# Patient Record
Sex: Male | Born: 1955
Health system: Southern US, Community
[De-identification: ages and names within clinical notes are randomized; demographics above are authoritative.]

## PROBLEM LIST (undated history)

## (undated) DIAGNOSIS — E119 Type 2 diabetes mellitus without complications: Secondary | ICD-10-CM

## (undated) DIAGNOSIS — I2510 Atherosclerotic heart disease of native coronary artery without angina pectoris: Secondary | ICD-10-CM

## (undated) DIAGNOSIS — J9601 Acute respiratory failure with hypoxia: Secondary | ICD-10-CM

## (undated) DIAGNOSIS — K5790 Diverticulosis of intestine, part unspecified, without perforation or abscess without bleeding: Secondary | ICD-10-CM

## (undated) DIAGNOSIS — I5021 Acute systolic (congestive) heart failure: Secondary | ICD-10-CM

## (undated) DIAGNOSIS — R918 Other nonspecific abnormal finding of lung field: Secondary | ICD-10-CM

## (undated) DIAGNOSIS — I82409 Acute embolism and thrombosis of unspecified deep veins of unspecified lower extremity: Secondary | ICD-10-CM

## (undated) HISTORY — PX: KNEE SURGERY: SHX244

## (undated) HISTORY — DX: Diverticulosis of intestine, part unspecified, without perforation or abscess without bleeding: K57.90

---

## 2013-11-18 ENCOUNTER — Emergency Department (HOSPITAL_COMMUNITY)
Admission: EM | Admit: 2013-11-18 | Discharge: 2013-11-18 | Disposition: A | Discharge status: Home / Self Care | Payer: BC Managed Care – PPO | Attending: Emergency Medicine | Admitting: Emergency Medicine

## 2013-11-18 ENCOUNTER — Emergency Department (HOSPITAL_COMMUNITY): Payer: BC Managed Care – PPO

## 2013-11-18 ENCOUNTER — Encounter (HOSPITAL_COMMUNITY): Payer: Self-pay | Admitting: Emergency Medicine

## 2013-11-18 DIAGNOSIS — R630 Anorexia: Secondary | ICD-10-CM | POA: Diagnosis not present

## 2013-11-18 DIAGNOSIS — E119 Type 2 diabetes mellitus without complications: Secondary | ICD-10-CM

## 2013-11-18 DIAGNOSIS — F17200 Nicotine dependence, unspecified, uncomplicated: Secondary | ICD-10-CM | POA: Diagnosis not present

## 2013-11-18 DIAGNOSIS — R1011 Right upper quadrant pain: Secondary | ICD-10-CM | POA: Diagnosis present

## 2013-11-18 DIAGNOSIS — K2980 Duodenitis without bleeding: Secondary | ICD-10-CM

## 2013-11-18 DIAGNOSIS — I10 Essential (primary) hypertension: Secondary | ICD-10-CM | POA: Diagnosis not present

## 2013-11-18 LAB — URINALYSIS, ROUTINE W REFLEX MICROSCOPIC
Bilirubin Urine: NEGATIVE
Glucose, UA: >1000 mg/dL — AB
Hgb urine dipstick: NEGATIVE
Ketones, ur: NEGATIVE mg/dL
LEUKOCYTES UA: NEGATIVE
NITRITE: NEGATIVE
PH: 5 (ref 5.0–8.0)
Protein, ur: 30 mg/dL — AB
Specific Gravity, Urine: 1.029 (ref 1.005–1.030)
Urobilinogen, UA: 0.2 mg/dL (ref 0.0–1.0)

## 2013-11-18 LAB — CBC WITH DIFFERENTIAL/PLATELET
BASOS PCT: 0 % (ref 0–1)
Basophils Absolute: 0.1 10*3/uL (ref 0.0–0.1)
EOS ABS: 0.2 10*3/uL (ref 0.0–0.7)
Eosinophils Relative: 1 % (ref 0–5)
HEMATOCRIT: 51.3 % (ref 39.0–52.0)
Hemoglobin: 18.5 g/dL — ABNORMAL HIGH (ref 13.0–17.0)
Lymphocytes Relative: 16 % (ref 12–46)
Lymphs Abs: 3 10*3/uL (ref 0.7–4.0)
MCH: 32.2 pg (ref 26.0–34.0)
MCHC: 36.1 g/dL — AB (ref 30.0–36.0)
MCV: 89.2 fL (ref 78.0–100.0)
MONO ABS: 1.6 10*3/uL — AB (ref 0.1–1.0)
Monocytes Relative: 9 % (ref 3–12)
Neutro Abs: 14.1 10*3/uL — ABNORMAL HIGH (ref 1.7–7.7)
Neutrophils Relative %: 74 % (ref 43–77)
PLATELETS: 285 10*3/uL (ref 150–400)
RBC: 5.75 MIL/uL (ref 4.22–5.81)
RDW: 13.4 % (ref 11.5–15.5)
WBC: 19 10*3/uL — ABNORMAL HIGH (ref 4.0–10.5)

## 2013-11-18 LAB — COMPREHENSIVE METABOLIC PANEL
ALT: 12 U/L (ref 0–53)
AST: 10 U/L (ref 0–37)
Albumin: 3.6 g/dL (ref 3.5–5.2)
Alkaline Phosphatase: 95 U/L (ref 39–117)
Anion gap: 13 (ref 5–15)
BUN: 13 mg/dL (ref 6–23)
CALCIUM: 9.9 mg/dL (ref 8.4–10.5)
CO2: 25 mEq/L (ref 19–32)
CREATININE: 0.88 mg/dL (ref 0.50–1.35)
Chloride: 92 mEq/L — ABNORMAL LOW (ref 96–112)
GFR calc Af Amer: >90 mL/min (ref >90)
Glucose, Bld: 284 mg/dL — ABNORMAL HIGH (ref 70–99)
Potassium: 4.2 mEq/L (ref 3.7–5.3)
Sodium: 130 mEq/L — ABNORMAL LOW (ref 137–147)
TOTAL PROTEIN: 7.8 g/dL (ref 6.0–8.3)
Total Bilirubin: 0.6 mg/dL (ref 0.3–1.2)

## 2013-11-18 LAB — CBG MONITORING, ED: Glucose-Capillary: 290 mg/dL — ABNORMAL HIGH (ref 70–99)

## 2013-11-18 LAB — LIPASE, BLOOD: Lipase: 46 U/L (ref 11–59)

## 2013-11-18 LAB — I-STAT TROPONIN, ED: Troponin i, poc: 0 ng/mL (ref 0.00–0.08)

## 2013-11-18 LAB — URINE MICROSCOPIC-ADD ON

## 2013-11-18 MED ORDER — IOHEXOL 300 MG/ML  SOLN
50.0000 mL | Freq: Once | INTRAMUSCULAR | Status: AC | PRN
Start: 2013-11-18 — End: 2013-11-18
  Administered 2013-11-18: 50 mL via ORAL

## 2013-11-18 MED ORDER — SODIUM CHLORIDE 0.9 % IV SOLN
1000.0000 mL | INTRAVENOUS | Status: DC
  Administered 2013-11-18: 1000 mL via INTRAVENOUS

## 2013-11-18 MED ORDER — HYDROCODONE-ACETAMINOPHEN 5-325 MG PO TABS
1.0000 | ORAL_TABLET | Freq: Once | ORAL | Status: AC
  Administered 2013-11-18: 1 via ORAL
  Filled 2013-11-18: qty 1

## 2013-11-18 MED ORDER — LANSOPRAZOLE 30 MG PO TBDP: 30.0000 mg | ORAL_TABLET | Freq: Every day | ORAL | Status: DC

## 2013-11-18 MED ORDER — PANTOPRAZOLE SODIUM 40 MG IV SOLR
40.0000 mg | Freq: Once | INTRAVENOUS | Status: AC
  Administered 2013-11-18: 40 mg via INTRAVENOUS
  Filled 2013-11-18: qty 40

## 2013-11-18 MED ORDER — SODIUM CHLORIDE 0.9 % IV SOLN
1000.0000 mL | Freq: Once | INTRAVENOUS | Status: AC
  Administered 2013-11-18: 1000 mL via INTRAVENOUS

## 2013-11-18 MED ORDER — HYDROCHLOROTHIAZIDE 25 MG PO TABS: 25.0000 mg | ORAL_TABLET | Freq: Every day | ORAL | Status: DC

## 2013-11-18 MED ORDER — IOHEXOL 300 MG/ML  SOLN
100.0000 mL | Freq: Once | INTRAMUSCULAR | Status: AC | PRN
  Administered 2013-11-18: 100 mL via INTRAVENOUS

## 2013-11-18 NOTE — ED Provider Notes (Signed)
CSN: [id number]     Arrival date & time 11/18/13  1858 History   First MD Initiated Contact with Patient 11/18/13 1937     Chief Complaint  Patient presents with  . Abdominal Pain    HPI Comments: Started Monday afternoon.  He thought he may have eaten something.  Patient is a 58 y.o. male presenting with abdominal pain. The history is provided by the patient.  Abdominal Pain Pain location:  Epigastric Pain quality: aching   Pain radiates to:  Does not radiate Pain severity:  Moderate Onset quality:  Gradual Timing:  Constant Progression:  Unchanged Chronicity:  New Relieved by:  Eating and belching (would make it better very briefly) Associated symptoms: anorexia   Associated symptoms: no chest pain, no diarrhea, no dysuria, no fever and no vomiting     History reviewed. No pertinent past medical history. Past Surgical History  Procedure Laterality Date  . Knee surgery     No family history on file. History  Substance Use Topics  . Smoking status: Current Every Day Smoker  . Smokeless tobacco: Not on file  . Alcohol Use: Yes     Comment: daily     Review of Systems  Constitutional: Negative for fever.  Cardiovascular: Negative for chest pain.  Gastrointestinal: Positive for abdominal pain and anorexia. Negative for vomiting and diarrhea.  Genitourinary: Negative for dysuria.  All other systems reviewed and are negative.     Allergies  Bee venom  Home Medications   Prior to Admission medications   Medication Sig Start Date End Date Taking? Authorizing Provider  ibuprofen (ADVIL,MOTRIN) 200 MG tablet Take 600 mg by mouth every 6 (six) hours as needed for moderate pain.   Yes Historical Provider, MD  hydrochlorothiazide (HYDRODIURIL) 25 MG tablet Take 1 tablet (25 mg total) by mouth daily. 11/18/13   Linwood Dibbles, MD  lansoprazole (PREVACID SOLUTAB) 30 MG disintegrating tablet Take 1 tablet (30 mg total) by mouth daily at 12 noon. 11/18/13   Linwood Dibbles, MD   BP  171/106  Pulse 91  Temp(Src) 98.4 F (36.9 C) (Oral)  Resp 20  SpO2 97% Physical Exam  Nursing note and vitals reviewed. Constitutional: He appears well-developed and well-nourished. No distress.  HENT:  Head: Normocephalic and atraumatic.  Right Ear: External ear normal.  Left Ear: External ear normal.  Eyes: Conjunctivae are normal. Right eye exhibits no discharge. Left eye exhibits no discharge. No scleral icterus.  Neck: Neck supple. No tracheal deviation present.  Cardiovascular: Normal rate, regular rhythm and intact distal pulses.   Pulmonary/Chest: Effort normal and breath sounds normal. No stridor. No respiratory distress. He has no wheezes. He has no rales.  Abdominal: Soft. Bowel sounds are normal. He exhibits no distension, no ascites and no mass. There is tenderness in the right upper quadrant. There is no rigidity, no rebound, no guarding and no CVA tenderness.  Musculoskeletal: He exhibits no edema and no tenderness.  Neurological: He is alert. He has normal strength. No cranial nerve deficit (no facial droop, extraocular movements intact, no slurred speech) or sensory deficit. He exhibits normal muscle tone. He displays no seizure activity. Coordination normal.  Skin: Skin is warm and dry. No rash noted.  Psychiatric: He has a normal mood and affect.    ED Course  Procedures (including critical care time) Labs Review Labs Reviewed  CBC WITH DIFFERENTIAL - Abnormal; Notable for the following:    WBC 19.0 (*)    Hemoglobin 18.5 (*)  MCHC 36.1 (*)    Neutro Abs 14.1 (*)    Monocytes Absolute 1.6 (*)    All other components within normal limits  COMPREHENSIVE METABOLIC PANEL - Abnormal; Notable for the following:    Sodium 130 (*)    Chloride 92 (*)    Glucose, Bld 284 (*)    All other components within normal limits  URINALYSIS, ROUTINE W REFLEX MICROSCOPIC - Abnormal; Notable for the following:    Color, Urine AMBER (*)    Glucose, UA >1000 (*)    Protein,  ur 30 (*)    All other components within normal limits  URINE MICROSCOPIC-ADD ON - Abnormal; Notable for the following:    Casts HYALINE CASTS (*)    All other components within normal limits  CBG MONITORING, ED - Abnormal; Notable for the following:    Glucose-Capillary 290 (*)    All other components within normal limits  LIPASE, BLOOD  I-STAT TROPOININ, ED    Imaging Review US Abdomen Complete  11/18/2013   CLINICAL DATA:  Pain.  EXAM: ULTRASOUND ABDOMEN COMPLETE  COMPARISON:  None.  FINDINGS: Gallbladder:  No gallstones or wall thickening visualized. No sonographic Murphy sign noted.  Common bile duct:  Diameter: 2.5 mm  Liver:  No focal lesion identified. Within normal limits in parenchymal echogenicity.  IVC:  No abnormality visualized.  Pancreas:  Visualized portion unremarkable.  Spleen:  Size and appearance within normal limits.  Right Kidney:  Length: 13.2 cm. Echogenicity within normal limits. 2.1 x 2.1 x 2.2 cm cyst containing an echogenic peripheral focus. Further evaluation of this finding with triphasic CT suggested.  Left Kidney:  Length: 12.5 cm. Echogenicity within normal limits. No significant mass or hydronephrosis visualized. 2.3 x 3.4 x 2.4 lower pole simple cyst. 2.0 x 1.7 x 1.7 cm upper pole simple cyst.  Abdominal aorta:  No aneurysm visualized.  Other findings:  None.  IMPRESSION: 1. No acute abnormality. 2. Right renal 2.1 x 2.1 x 2.1 cm cyst containing an echogenic possibly calcified peripheral focus. Further evaluation with nonemergent triphasic CT suggested . 3. Simple left renal cyst.   Electronically Signed   By: Maisie Fus  Register   On: 11/18/2013 20:51   Ct Abdomen Pelvis W Contrast  11/18/2013   CLINICAL DATA:  Right lower quadrant abdominal pain  EXAM: CT ABDOMEN AND PELVIS WITH CONTRAST  TECHNIQUE: Multidetector CT imaging of the abdomen and pelvis was performed using the standard protocol following bolus administration of intravenous contrast.  CONTRAST:  50mL  OMNIPAQUE IOHEXOL 300 MG/ML SOLN, OMNIPAQUE IOHEXOL 300 MG/ML SOLN  COMPARISON:  None.  FINDINGS: BODY WALL: Unremarkable.  LOWER CHEST: Unremarkable.  ABDOMEN/PELVIS:  Liver: No suspicious focal findings.  Biliary: No evidence of biliary obstruction or stone.  Pancreas: Subtle haziness of fat around the uncinate process. No evidence of mass. No ductal enlargement.  Spleen: Unremarkable.  Adrenals: Unremarkable.  Kidneys and ureters: No hydronephrosis or stone (hilar calcification is arterial). Bilateral renal cysts without concerning complexity.  Bladder: Unremarkable.  Reproductive: Unremarkable.  Bowel: No obstruction. Normal appendix. Distal colonic diverticulosis.  Retroperitoneum: No mass or adenopathy.  Peritoneum: No ascites or pneumoperitoneum.  Vascular: No acute abnormality. Extensive aortic and branch vessel atherosclerosis for age.  OSSEOUS: No acute abnormalities.  IMPRESSION: 1. Negative appendix. 2. Subtle fat haziness around the pancreatic uncinate process and mid duodenum. Given negative lipase, this could reflect mild duodenitis or peptic ulcer disease. 3. Colonic diverticulosis.   Electronically Signed   By: Christiane Ha  Watts M.D.   On: 11/18/2013 22:34     EKG Interpretation   Date/Time:  Thursday November 18 2013 19:39:40 EDT Ventricular Rate:  91 PR Interval:  139 QRS Duration: 85 QT Interval:  348 QTC Calculation: 428 R Axis:   14 Text Interpretation:  Sinus rhythm Consider right atrial enlargement  Borderline repolarization abnormality No old tracing to compare Confirmed  by Keynan Heffern  MD-J, Angelisa Winthrop [phone]([id number]) on 11/18/2013 7:44:41 PM     Medications  0.9 %  sodium chloride infusion (0 mLs Intravenous Stopped 11/18/13 2103)    Followed by  0.9 %  sodium chloride infusion (0 mLs Intravenous Stopped 11/18/13 2311)  pantoprazole (PROTONIX) injection 40 mg (40 mg Intravenous Given 11/18/13 2100)  iohexol (OMNIPAQUE) 300 MG/ML solution 50 mL (50 mLs Oral Contrast Given 11/18/13 2202)   iohexol (OMNIPAQUE) 300 MG/ML solution 100 mL (100 mLs Intravenous Contrast Given 11/18/13 2202)  HYDROcodone-acetaminophen (NORCO/VICODIN) 5-325 MG per tablet 1 tablet (1 tablet Oral Given 11/18/13 2315)    MDM   Final diagnoses:  Duodenitis  Essential hypertension  Type 2 diabetes mellitus without complication   The patient's CT scan shows inflammation around the duodenum and pancreas. His lipase is normal. The patient's symptoms may be related to duodenitis. He has noticed some improvement after each something which would be consistent with duodenitis. We'll discharge her home on antacids. And followup with primary care Dr. Bonita QuinYou may consider seeing a GI doctor as well as his symptoms did not improve.   Follow up with PCP regarding dm and htn.  Rx for HCTZ.  Discussed diet modification and importance of PCP follow up     Linwood DibblesJon Beatryce [place]ombo, MD 11/18/13 2325

## 2013-11-18 NOTE — ED Notes (Signed)
Pt presents with c/o abdominal pain. Pt was seen at a doctor today for the abdominal pain and was referred here because his sugar was >1000 in his urine. Pt also reports that the doctor said his EKG was abnormal so he wanted him to come to the ER. Pt denies any diarrhea, admits to nausea, no vomiting.

## 2013-11-18 NOTE — Discharge Instructions (Signed)
Duodenitis Duodenitis is inflammation of the lining of the first part of your small intestine (duodenum). There are two types of duodenitis:  Acute duodenitis (develops suddenly and is short lived).   Chronic duodenitis (develops over an extended period and lasts months to years). CAUSES  Duodenitis is most often caused by infection with the bacterium Helicobacter pylori (H. pylori). H. pylori increases the production of stomach acid and causes changes in the environment of the duodenum. This irritates and damages the cells of the duodenum causing inflammation. Other causes of duodenitis include:   Long-term use of nonsteroidal anti-inflammatory drugs (NSAIDs). NSAIDs change the lining of the duodenum and make it more prone to injury from stomach acid.  Excessive use of alcohol. Alcohol increases stomach acid and changes the lining of the duodenum which makes it more likely for inflammation to develop.  Giardiasis. Giardiasis is a common infection of the small intestine. It can cause inflammation of the duodenum.   Other gastrointestinal disorders, such as Crohn disease. People with these disorders are more likely to develop duodenitis. SYMPTOMS  Although duodenitis does not always cause symptoms, symptoms that do occur include:  Nausea or vomiting.  Gassy, bloated feeling or an uncomfortable feeling of fullness after eating.  Burning, cramps, or pain in the upper abdominal area. DIAGNOSIS  To diagnose duodenitis, your health care provider may use results from:   An exam of the duodenum using a thin tube with a tiny camera on the tip, which is placed down your throat (endoscope). The endoscope is passed through your stomach and into your duodenum. Sometimes a sample of tissue from your duodenum is removed with the endoscope. The sample is then examined under a microscope (biopsy) for signs of inflammation and H. pylori infection.   Tests that check samples of your blood or stool for  H. pylori infection.   A test that checks the gases in a sample of your expired breath for H. pylori infection. The test measures the levels of carbon dioxide in your breath after you drink a special solution.  An X-ray exam using a special liquid that you swallow to illuminate your digestive tract (barium) to show signs of inflammation. TREATMENT  Treatment will depend on the cause of the duodenitis. The most common treatments include:  Use of medication to treat infection.  Medication to reduce stomach acid.  Discontinuing the use of NSAIDs.  Management of other gastrointestinal conditions.  Avoiding alcohol consumption. Additionally, taking the following steps can help to reduce the severity of your symptoms:  Drink enough water to keep your urine clear or pale yellow.  Avoid consuming these foods or drinks:  Caffeinated drinks.  Chocolate.  Peppermint or mint-flavored food or drinks.  Garlic.  Onions.  Spicy foods.  Citrus fruits, such as oranges, lemons, or limes.  Foods that use tomato-based sauces, such as pasta sauce, chili, salsa, and pizza.  Fatty foods.  Fried foods. Document Released: 07/13/2012 Document Revised: 08/02/2013 Document Reviewed: 07/13/2012 [facility] Patient Information 2015 [place], [place]. This information is not intended to replace advice given to you by your health care provider. Make sure you discuss any questions you have with your health care provider.   Emergency Department Resource Guide 1) Find a Doctor and Pay Out of Pocket Although you won't have to find out who is covered by your insurance plan, it is a good idea to ask around and get recommendations. You will then need to call the office and see if the doctor you have  chosen will accept you as a new patient and what types of options they offer for patients who are self-pay. Some doctors offer discounts or will set up payment plans for their patients who do not have insurance, but  you will need to ask so you aren't surprised when you get to your appointment.  2) Contact Your Local Health Department Not all health departments have doctors that can see patients for sick visits, but many do, so it is worth a call to see if yours does. If you don't know where your local health department is, you can check in your phone book. The CDC also has a tool to help you locate your state's health department, and many state websites also have listings of all of their local health departments.  3) Find a Walk-in Clinic If your illness is not likely to be very severe or complicated, you may want to try a walk in clinic. These are popping up all over the country in pharmacies, drugstores, and shopping centers. They're usually staffed by nurse practitioners or physician assistants that have been trained to treat common illnesses and complaints. They're usually fairly quick and inexpensive. However, if you have serious medical issues or chronic medical problems, these are probably not your best option.  No Primary Care Doctor: - Call Health Connect at  [phone] - they can help you locate a primary care doctor that  accepts your insurance, provides certain services, etc. - Physician Referral Service- [phone]  Chronic Pain Problems: Organization         Address  Phone   Notes  [facility]  [phone] Patients need to be referred by their primary care doctor.   Medication Assistance: Organization         Address  Phone   Notes  [facility] [address] [place], [place] [address] [phone]) [phone] --Must be a resident of [facility] -- Must have NO insurance coverage whatsoever (no Medicaid/ Medicare, etc.) -- The pt. MUST have a primary care doctor that directs their care regularly and follows them in the community   Med[facility]ist  [phone]   [employer]  [phone]    Agencies that provide inexpensive  medical care: Organization         Address  Phone   Notes  [facility]  [phone]) [phone]   [facility]    [phone]) [phone]   [facility] [address] [place], [place] [address] [phone]) [phone]   [facility] [address], [place] [phone]   [facility]    [phone]   [facility]    [phone]   [facility]  [address], [place] Phone:  [phone], Fax:  [phone] Hours of Operation:  9 am - 6 pm, M-F.  Also accepts Medicaid/Medicare and self-pay.  [facility]  [address], [place] Phone: [phone](336) [phone], Fax: [phone]. Hours of Operation:  8:30 am - 5:30 pm, M-F.  Also accepts Medicaid and self-pay.  [facility] [place] [address], [place] Phone: [phone](336) [phone]   [facility] [address], [place], [place] [phone], Ext. [phone] Mondays & Thursdays: 7-9 AM.  First 15 patients are seen on a first come, first serve basis.    Medicaid-accepting [facility] Providers:  Organization  Address  Phone   Notes  [facility] [address], [place] [phone] Also accepts self-pay patients.  [facility] [address] Laurell Josephs [place], [place]  [phone]   [facility] [address], [place] [phone]   [facility] [address], [place] [phone]   Renaye Rakers [address], [place]   [phone] Only accepts [place] Access [place] patients after they have their name applied to their card.   Self-Pay (no insurance) in [facility]:  Organization         Address  Phone   Notes  Sickle Cell Patients, [facility] [address] [place], [place] [phone]   [facility] [address] [place], [place] [phone]   [facility] [place]  [address], [place] [phone]   [facility]/Dr. Osei-Bonsu  [address] [place] or [address], [place] [phone] Phone number for both [place] and [place] locations is the same.  [facility] [address] [place] [phone]   [facility] [address] [place] or [address] [phone]   [facility] [address] [place] [phone], phone; [phone], fax Sees patients 1st and 3rd Saturday of every month.  Must not qualify for public or private insurance (i.e. Medicaid, Medicare, [place] Health Choice, Veterans' Benefits)  Household income should be no more than 200% of the poverty level The clinic cannot treat you if you are pregnant or think you are pregnant  Sexually transmitted diseases are not treated at the clinic.   Dental Care: Organization         Address  Phone  Notes  [facility] [address] [place], [place] [phone] Accepts children up to age 49 who are enrolled in [place] or [place] Health Choice; pregnant women with a Medicaid card; and children who have applied for Medicaid or [place] Health Choice, but were declined, whose parents can pay a reduced fee at time of service.  [facility]  [address], [place] [phone] Accepts children up to age 54 who are enrolled in [place] or [place] Health Choice; pregnant women with a Medicaid card; and children who have applied for Medicaid or [place] Health Choice, but were declined, whose parents can pay a reduced fee at time of service.  [facility]  [address] [place], [place] [phone] Patients are seen by appointment only. Walk-ins are not accepted.  [facility] will see patients 80 years of age and older. Monday - Tuesday (8am-5pm) Most Wednesdays (8:30-5pm) $30 per visit, cash only  [facility]  [address], [facility] [phone] Patients are seen by appointment only. Walk-ins are not accepted. [facility] will see patients 83 years of age and older. One Wednesday Evening (Monthly: Volunteer Based).  $30 per visit, cash only  [employer]  [phone] for adults; Children under age 57, call Graduate Pediatric Dentistry at [phone]. Children aged 81-14, please call [phone] to request a pediatric application.  Dental services are provided in all areas of dental care including fillings,  crowns and bridges, complete and partial dentures, implants, gum treatment, root canals, and extractions. Preventive care is also provided. Treatment is provided to both adults and children. Patients are selected via a lottery and there is often a waiting list.   [facility] [address], [place]  [phone] [email or web address]   [facility] [address] [place], [place] [phone], Ext. [phone] Second and Fourth Thursday of each month, opens at 6:30 AM; Clinic ends at 9 AM.  Patients are seen on a first-come first-served basis, and a limited number are seen during each clinic.   [facility]  [address] [place], [place] [phone]   Eligibility Requirements You must have lived in [place], [place], or [place] counties for at least the last three months.   You cannot be eligible for state or federal sponsored [place], including [employer], [place], or [employer].   You generally cannot be eligible for healthcare insurance through your employer.    How to apply: Eligibility screenings are held every Tuesday and Wednesday afternoon from 1:00 pm until 4:00 pm. You do not need an appointment for the  interview!  [facility] [address], [place], [place] [phone]   [facility] Health Department  [phone]   [facility] Health Department  [phone]   [facility] Health Department  [phone]    Behavioral Health Resources in the Community: Intensive Outpatient Programs Organization         Address  Phone  Notes  [facility] [address] [place], [place] [phone]   [facility] [address] [place], [place] [phone]   [facility] [address], [place], [place]  [phone]   [facility] Mental Health [address],  [place], [place] [phone] or [phone]   Substance Abuse Resources Organization         Address  Phone  Notes  [facility]  [phone]   [facility]  [phone]   [facility]  [phone]   Floydene Flock  [phone]   Residential & Outpatient Substance Abuse Program  [phone]   Psychological Services Organization         Address  Phone  Notes  [facility] Behavioral Health  [phone]   [facility]  [phone]   [facility] [address] [place] [phone] or [phone]    Mobile Crisis Teams Organization         Address  Phone  Notes  [facility], Mobile Crisis Care Unit  [phone]   [facility]  [address] [place], [place] [phone]   Doristine Locks [address] [place] [phone]    Self-Help/Support Groups Organization         Address  Phone             Notes  [facility] [place] - variety of support groups  336- I7437963 Call for more information  Narcotics Anonymous (NA), [facility] [address], [employer] [place]  2 meetings at this location   Statistician         Address  Phone  Notes  [facility]  [address],    [place]  [phone]   [facility]  [address], [place] [address], [place], [place] [phone]   [facility] [address] [place], [place]  [place] [phone] Admissions: 8am-3pm M-F  °[facility] [address]    °[place], [place] [phone]   °[facility] [address], [place], [place] [phone]   °[facility] [address]  °[place], [place] [phone]   °[facility] - Intensive Outpatient [address], [place], [place] [phone]   °[facility] ([facility] [address]  °[place], [place] [phone] or [phone]   °[facility] ([facility]) [address] [place], [place] [phone] Accepts Medicaid  °[facility] [address]  °[place] 1-800-659-3381 Substance Abuse/Addiction Treatment  ° °[place] Behavioral Health Resources °Organization         Address  Phone  Notes  °[facility]  (888) [phone]   °Julie Brannon, PhD [address] [place], [place]   (336) [phone] or (336) [phone]   °[facility]   [address] °[place], [place] (336) [phone]   °[facility] [address], [place], [place] (336) [phone] Insurance/Medicaid/sponsorship through [facility]  °[facility] [address]                                    [place], [place] [phone]) [phone] Therapy/tele-psych/case  °[facility] [address]  ° [place], [place] (336) [phone]    °Dr. Arfeen  ([phone]) [phone]   °[facility] [place]  [employer] [place] [facility] 1) [address],  °2) [address], [place] °3)  [address], [place] ([phone]) [phone] °(336) [phone] ° °(336) [phone]   °[place] Child Abuse Hotline (336) 342-1394 or (336) 342-3537 (After Hours)    ° ° ° ° °

## 2014-01-26 ENCOUNTER — Ambulatory Visit: Payer: BC Managed Care – PPO | Admitting: Gastroenterology

## 2015-08-31 DIAGNOSIS — R918 Other nonspecific abnormal finding of lung field: Secondary | ICD-10-CM

## 2015-08-31 HISTORY — DX: Other nonspecific abnormal finding of lung field: R91.8

## 2015-09-19 ENCOUNTER — Ambulatory Visit (INDEPENDENT_AMBULATORY_CARE_PROVIDER_SITE_OTHER): Payer: Self-pay

## 2015-09-19 ENCOUNTER — Ambulatory Visit (INDEPENDENT_AMBULATORY_CARE_PROVIDER_SITE_OTHER): Payer: Self-pay | Admitting: Family Medicine

## 2015-09-19 VITALS — BP 142/84 | HR 114 | Temp 97.3°F | Resp 20

## 2015-09-19 DIAGNOSIS — R0600 Dyspnea, unspecified: Secondary | ICD-10-CM

## 2015-09-19 DIAGNOSIS — E1310 Other specified diabetes mellitus with ketoacidosis without coma: Secondary | ICD-10-CM

## 2015-09-19 DIAGNOSIS — R000 Tachycardia, unspecified: Secondary | ICD-10-CM

## 2015-09-19 DIAGNOSIS — E1110 Type 2 diabetes mellitus with ketoacidosis without coma: Secondary | ICD-10-CM

## 2015-09-19 LAB — POCT CBC
Granulocyte percent: 71 %G (ref 37–80)
HCT, POC: 49.8 % (ref 43.5–53.7)
Hemoglobin: 17.1 g/dL (ref 14.1–18.1)
Lymph, poc: 3 (ref 0.6–3.4)
MCH, POC: 32.2 pg — AB (ref 27–31.2)
MCHC: 34.4 g/dL (ref 31.8–35.4)
MCV: 93.4 fL (ref 80–97)
MID (cbc): 0.9 (ref 0–0.9)
MPV: 8.2 fL (ref 0–99.8)
POC Granulocyte: 9.7 — AB (ref 2–6.9)
POC LYMPH PERCENT: 22.4 %L (ref 10–50)
POC MID %: 6.6 %M (ref 0–12)
Platelet Count, POC: 182 10*3/uL (ref 142–424)
RBC: 5.32 M/uL (ref 4.69–6.13)
RDW, POC: 13.5 %
WBC: 13.6 10*3/uL — AB (ref 4.6–10.2)

## 2015-09-19 LAB — COMPLETE METABOLIC PANEL WITH GFR
ALT: 178 U/L — ABNORMAL HIGH (ref 9–46)
AST: 165 U/L — ABNORMAL HIGH (ref 10–35)
Albumin: 3.7 g/dL (ref 3.6–5.1)
Alkaline Phosphatase: 115 U/L (ref 40–115)
BUN: 21 mg/dL (ref 7–25)
CO2: 22 mmol/L (ref 20–31)
Calcium: 9.2 mg/dL (ref 8.6–10.3)
Chloride: 100 mmol/L (ref 98–110)
Creat: 0.83 mg/dL (ref 0.70–1.33)
GFR, Est African American: >89 mL/min (ref >=60)
GFR, Est Non African American: >89 mL/min (ref >=60)
Glucose, Bld: 360 mg/dL — ABNORMAL HIGH (ref 65–99)
Potassium: 5.1 mmol/L (ref 3.5–5.3)
Sodium: 131 mmol/L — ABNORMAL LOW (ref 135–146)
Total Bilirubin: 0.9 mg/dL (ref 0.2–1.2)
Total Protein: 6.2 g/dL (ref 6.1–8.1)

## 2015-09-19 LAB — GLUCOSE, POCT (MANUAL RESULT ENTRY): POC Glucose: 387 mg/dl — AB (ref 70–99)

## 2015-09-19 MED ORDER — GLIPIZIDE 5 MG PO TABS: 5.0000 mg | ORAL_TABLET | Freq: Two times a day (BID) | ORAL | Status: DC

## 2015-09-19 MED ORDER — LEVOFLOXACIN 500 MG PO TABS: 500.0000 mg | ORAL_TABLET | Freq: Every day | ORAL | Status: DC

## 2015-09-19 MED ORDER — CEFTRIAXONE SODIUM 1 G IJ SOLR
1.0000 g | Freq: Once | INTRAMUSCULAR | Status: AC
  Administered 2015-09-19: 1 g via INTRAMUSCULAR

## 2015-09-19 NOTE — Patient Instructions (Addendum)
You have pneumonia and new onset diabetes. This is why you feel so bad. If you get more short of breath, you will need to go to the emergency room.  Avoid sweet drinks and sugary foods. Please drink plenty of water over the next 48 hours.  I initiated antibiotics here but I want you to get Your prescriptions for diabetes and pneumonia at the pharmacy. I also want you to come back in 48 hours so we can reevaluate the progress you're making on diabetes and pneumonia.    IF you received an x-ray today, you will receive an invoice from [facility]. Please contact [facility] at [phone] with questions or concerns regarding your invoice.   IF you received labwork today, you will receive an invoice from [facility]. Please contact [facility] at [phone] with questions or concerns regarding your invoice.   Our billing staff will not be able to assist you with questions regarding bills from these companies.  You will be contacted with the lab results as soon as they are available. The fastest way to get your results is to activate your My Chart account. Instructions are located on the last page of this paperwork. If you have not heard from us regarding the results in 2 weeks, please contact this office.

## 2015-09-19 NOTE — Progress Notes (Signed)
60 yo man with shortness of breath.  This began 2 days ago after driving 3 hours west to [place] to visit a friend. The shortness of breath has been progressive. He denies chest pain or abdominal pain, denies nausea and vomiting as well. The shortness of breath has been rather progressive without an abrupt onset.  Patient does not have any asthma. He's an every day smoker.  Patient has had some pain in his right knee but not his calf. Said no significant swelling in his legs or tenderness in the muscles.   Objective: BP 142/84 mmHg  Pulse 114  Temp(Src) 97.3 F (36.3 C) (Oral)  Resp 20  SpO2 97% General appearance: Patient looks uncomfortable and slightly dyspneic. HEENT: Unremarkable Neck: Supple no adenopathy or JVD Chest: Clear to auscultation Heart: Regular, rapid, no murmur or gallop, no rub Abdomen: Nontender Skin: No rash  EKG: sinus tachycardia. Chest x-ray:  Middle lobe density Results for orders placed or performed in visit on 09/19/15  POCT CBC  Result Value Ref Range   WBC 13.6 (A) 4.6 - 10.2 K/uL   Lymph, poc 3.0 0.6 - 3.4   POC LYMPH PERCENT 22.4 10 - 50 %L   MID (cbc) 0.9 0 - 0.9   POC MID % 6.6 0 - 12 %M   POC Granulocyte 9.7 (A) 2 - 6.9   Granulocyte percent 71.0 37 - 80 %G   RBC 5.32 4.69 - 6.13 M/uL   Hemoglobin 17.1 14.1 - 18.1 g/dL   HCT, POC 16.149.8 09.643.5 - 53.7 %   MCV 93.4 80 - 97 fL   MCH, POC 32.2 (A) 27 - 31.2 pg   MCHC 34.4 31.8 - 35.4 g/dL   RDW, POC 04.513.5 %   Platelet Count, POC 182 142 - 424 K/uL   MPV 8.2 0 - 99.8 fL  POCT glucose (manual entry)  Result Value Ref Range   POC Glucose 387 (A) 70 - 99 mg/dl    Assessment: Patient has new onset diabetes which is not controlled but not in a dangerous range. He will need close follow-up because of his pneumonia. I've asked him to come back in 48 hours after his initiation of antibiotics today. I discussed with him the importance of avoiding sugary drinks and to push fluids. If he gets worse he  is instructed to go to the emergency room.    ICD-9-CM ICD-10-CM   1. Dyspnea 786.09 R06.00 DG Chest 2 View     EKG 12-Lead     POCT CBC     POCT glucose (manual entry)     COMPLETE METABOLIC PANEL WITH GFR     cefTRIAXone (ROCEPHIN) injection 1 g     levofloxacin (LEVAQUIN) 500 MG tablet  2. Tachycardia 785.0 R00.0 EKG 12-Lead     cefTRIAXone (ROCEPHIN) injection 1 g     levofloxacin (LEVAQUIN) 500 MG tablet  3. Uncontrolled type 2 diabetes mellitus with ketoacidosis without coma, without long-term current use of insulin (HCC) 250.12 E13.10 glipiZIDE (GLUCOTROL) 5 MG tablet     Signed, Elvina SidleKurt Merrill Villarruel, MD

## 2015-09-21 ENCOUNTER — Telehealth: Payer: Self-pay | Admitting: Emergency Medicine

## 2015-09-21 NOTE — Telephone Encounter (Signed)
Left message to call for lab results. 

## 2015-09-21 NOTE — Telephone Encounter (Signed)
-----   Message from Elvina SidleKurt Lauenstein, MD sent at 09/20/2015 10:03 PM EDT ----- Patient has abnormal lab values.  As noted before, the glucose is high.  He also has some liver abnormalities, probably related to the elevated sugar.  These need follow up in one week.

## 2015-09-27 ENCOUNTER — Ambulatory Visit (INDEPENDENT_AMBULATORY_CARE_PROVIDER_SITE_OTHER): Payer: Self-pay

## 2015-09-27 ENCOUNTER — Encounter (HOSPITAL_COMMUNITY): Payer: Self-pay | Admitting: Emergency Medicine

## 2015-09-27 ENCOUNTER — Inpatient Hospital Stay (HOSPITAL_COMMUNITY): Payer: Self-pay

## 2015-09-27 ENCOUNTER — Inpatient Hospital Stay (HOSPITAL_COMMUNITY)
Admission: EM | Admit: 2015-09-27 | Discharge: 2015-10-07 | DRG: 246 | Disposition: A | Discharge status: Home / Self Care | Payer: Self-pay | Attending: Family Medicine | Admitting: Family Medicine

## 2015-09-27 ENCOUNTER — Telehealth: Payer: Self-pay

## 2015-09-27 ENCOUNTER — Ambulatory Visit (INDEPENDENT_AMBULATORY_CARE_PROVIDER_SITE_OTHER): Payer: Self-pay | Admitting: Family Medicine

## 2015-09-27 VITALS — BP 146/92 | HR 116 | Temp 98.9°F | Resp 21 | Ht 72.0 in | Wt 250.0 lb

## 2015-09-27 DIAGNOSIS — I110 Hypertensive heart disease with heart failure: Principal | ICD-10-CM | POA: Diagnosis present

## 2015-09-27 DIAGNOSIS — I519 Heart disease, unspecified: Secondary | ICD-10-CM | POA: Insufficient documentation

## 2015-09-27 DIAGNOSIS — R739 Hyperglycemia, unspecified: Secondary | ICD-10-CM

## 2015-09-27 DIAGNOSIS — I429 Cardiomyopathy, unspecified: Secondary | ICD-10-CM

## 2015-09-27 DIAGNOSIS — R740 Nonspecific elevation of levels of transaminase and lactic acid dehydrogenase [LDH]: Secondary | ICD-10-CM | POA: Diagnosis present

## 2015-09-27 DIAGNOSIS — I493 Ventricular premature depolarization: Secondary | ICD-10-CM | POA: Diagnosis present

## 2015-09-27 DIAGNOSIS — I82409 Acute embolism and thrombosis of unspecified deep veins of unspecified lower extremity: Secondary | ICD-10-CM | POA: Diagnosis present

## 2015-09-27 DIAGNOSIS — R600 Localized edema: Secondary | ICD-10-CM

## 2015-09-27 DIAGNOSIS — I255 Ischemic cardiomyopathy: Secondary | ICD-10-CM

## 2015-09-27 DIAGNOSIS — I248 Other forms of acute ischemic heart disease: Secondary | ICD-10-CM | POA: Diagnosis present

## 2015-09-27 DIAGNOSIS — R05 Cough: Secondary | ICD-10-CM

## 2015-09-27 DIAGNOSIS — K761 Chronic passive congestion of liver: Secondary | ICD-10-CM | POA: Diagnosis present

## 2015-09-27 DIAGNOSIS — Z7982 Long term (current) use of aspirin: Secondary | ICD-10-CM

## 2015-09-27 DIAGNOSIS — J9601 Acute respiratory failure with hypoxia: Secondary | ICD-10-CM | POA: Diagnosis present

## 2015-09-27 DIAGNOSIS — F17200 Nicotine dependence, unspecified, uncomplicated: Secondary | ICD-10-CM | POA: Diagnosis present

## 2015-09-27 DIAGNOSIS — R918 Other nonspecific abnormal finding of lung field: Secondary | ICD-10-CM

## 2015-09-27 DIAGNOSIS — I272 Other secondary pulmonary hypertension: Secondary | ICD-10-CM | POA: Diagnosis present

## 2015-09-27 DIAGNOSIS — E1110 Type 2 diabetes mellitus with ketoacidosis without coma: Secondary | ICD-10-CM

## 2015-09-27 DIAGNOSIS — R9439 Abnormal result of other cardiovascular function study: Secondary | ICD-10-CM | POA: Insufficient documentation

## 2015-09-27 DIAGNOSIS — I5021 Acute systolic (congestive) heart failure: Secondary | ICD-10-CM | POA: Diagnosis present

## 2015-09-27 DIAGNOSIS — R0600 Dyspnea, unspecified: Secondary | ICD-10-CM

## 2015-09-27 DIAGNOSIS — R079 Chest pain, unspecified: Secondary | ICD-10-CM

## 2015-09-27 DIAGNOSIS — J189 Pneumonia, unspecified organism: Secondary | ICD-10-CM

## 2015-09-27 DIAGNOSIS — R945 Abnormal results of liver function studies: Secondary | ICD-10-CM

## 2015-09-27 DIAGNOSIS — R0602 Shortness of breath: Secondary | ICD-10-CM | POA: Insufficient documentation

## 2015-09-27 DIAGNOSIS — R059 Cough, unspecified: Secondary | ICD-10-CM

## 2015-09-27 DIAGNOSIS — Z87891 Personal history of nicotine dependence: Secondary | ICD-10-CM | POA: Diagnosis present

## 2015-09-27 DIAGNOSIS — E871 Hypo-osmolality and hyponatremia: Secondary | ICD-10-CM | POA: Diagnosis present

## 2015-09-27 DIAGNOSIS — R7989 Other specified abnormal findings of blood chemistry: Secondary | ICD-10-CM

## 2015-09-27 DIAGNOSIS — I5023 Acute on chronic systolic (congestive) heart failure: Secondary | ICD-10-CM | POA: Diagnosis present

## 2015-09-27 DIAGNOSIS — I82431 Acute embolism and thrombosis of right popliteal vein: Secondary | ICD-10-CM | POA: Diagnosis present

## 2015-09-27 DIAGNOSIS — I82411 Acute embolism and thrombosis of right femoral vein: Secondary | ICD-10-CM | POA: Diagnosis present

## 2015-09-27 DIAGNOSIS — Z6832 Body mass index (BMI) 32.0-32.9, adult: Secondary | ICD-10-CM

## 2015-09-27 DIAGNOSIS — Z955 Presence of coronary angioplasty implant and graft: Secondary | ICD-10-CM

## 2015-09-27 DIAGNOSIS — F1010 Alcohol abuse, uncomplicated: Secondary | ICD-10-CM | POA: Diagnosis present

## 2015-09-27 DIAGNOSIS — R7401 Elevation of levels of liver transaminase levels: Secondary | ICD-10-CM

## 2015-09-27 DIAGNOSIS — R140 Abdominal distension (gaseous): Secondary | ICD-10-CM

## 2015-09-27 DIAGNOSIS — E669 Obesity, unspecified: Secondary | ICD-10-CM | POA: Diagnosis present

## 2015-09-27 DIAGNOSIS — M79661 Pain in right lower leg: Secondary | ICD-10-CM

## 2015-09-27 DIAGNOSIS — R601 Generalized edema: Secondary | ICD-10-CM

## 2015-09-27 DIAGNOSIS — R748 Abnormal levels of other serum enzymes: Secondary | ICD-10-CM | POA: Diagnosis present

## 2015-09-27 DIAGNOSIS — E1165 Type 2 diabetes mellitus with hyperglycemia: Secondary | ICD-10-CM | POA: Diagnosis present

## 2015-09-27 DIAGNOSIS — M7989 Other specified soft tissue disorders: Secondary | ICD-10-CM

## 2015-09-27 DIAGNOSIS — I509 Heart failure, unspecified: Secondary | ICD-10-CM

## 2015-09-27 DIAGNOSIS — I2583 Coronary atherosclerosis due to lipid rich plaque: Secondary | ICD-10-CM | POA: Diagnosis present

## 2015-09-27 DIAGNOSIS — I2510 Atherosclerotic heart disease of native coronary artery without angina pectoris: Secondary | ICD-10-CM | POA: Diagnosis present

## 2015-09-27 DIAGNOSIS — I82441 Acute embolism and thrombosis of right tibial vein: Secondary | ICD-10-CM | POA: Diagnosis present

## 2015-09-27 HISTORY — DX: Acute systolic (congestive) heart failure: I50.21

## 2015-09-27 HISTORY — DX: Other nonspecific abnormal finding of lung field: R91.8

## 2015-09-27 HISTORY — DX: Acute respiratory failure with hypoxia: J96.01

## 2015-09-27 HISTORY — DX: Atherosclerotic heart disease of native coronary artery without angina pectoris: I25.10

## 2015-09-27 HISTORY — DX: Type 2 diabetes mellitus without complications: E11.9

## 2015-09-27 HISTORY — DX: Acute embolism and thrombosis of unspecified deep veins of unspecified lower extremity: I82.409

## 2015-09-27 LAB — POCT CBC
Granulocyte percent: 85.8 %G — AB (ref 37–80)
HCT, POC: 49.8 % (ref 43.5–53.7)
HEMOGLOBIN: 17.3 g/dL (ref 14.1–18.1)
Lymph, poc: 1.7 (ref 0.6–3.4)
MCH: 32.2 pg — AB (ref 27–31.2)
MCHC: 34.7 g/dL (ref 31.8–35.4)
MCV: 92.8 fL (ref 80–97)
MID (cbc): 1.1 — AB (ref 0–0.9)
MPV: 8.3 fL (ref 0–99.8)
PLATELET COUNT, POC: 212 10*3/uL (ref 142–424)
POC Granulocyte: 17.1 — AB (ref 2–6.9)
POC LYMPH PERCENT: 8.5 %L — AB (ref 10–50)
POC MID %: 5.7 % (ref 0–12)
RBC: 5.37 M/uL (ref 4.69–6.13)
RDW, POC: 14.6 %
WBC: 19.9 10*3/uL — AB (ref 4.6–10.2)

## 2015-09-27 LAB — CBC
HCT: 49.7 % (ref 39.0–52.0)
HCT: 47.8 % (ref 39.0–52.0)
Hemoglobin: 16.9 g/dL (ref 13.0–17.0)
Hemoglobin: 16 g/dL (ref 13.0–17.0)
MCH: 31.9 pg (ref 26.0–34.0)
MCH: 31.4 pg (ref 26.0–34.0)
MCHC: 34 g/dL (ref 30.0–36.0)
MCHC: 33.5 g/dL (ref 30.0–36.0)
MCV: 93.8 fL (ref 78.0–100.0)
MCV: 93.9 fL (ref 78.0–100.0)
PLATELETS: 210 10*3/uL (ref 150–400)
PLATELETS: 209 10*3/uL (ref 150–400)
RBC: 5.3 MIL/uL (ref 4.22–5.81)
RBC: 5.09 MIL/uL (ref 4.22–5.81)
RDW: 14.1 % (ref 11.5–15.5)
RDW: 14.1 % (ref 11.5–15.5)
WBC: 19.8 10*3/uL — AB (ref 4.0–10.5)
WBC: 20 10*3/uL — ABNORMAL HIGH (ref 4.0–10.5)

## 2015-09-27 LAB — BASIC METABOLIC PANEL
ANION GAP: 8 (ref 5–15)
BUN: 13 mg/dL (ref 6–20)
CALCIUM: 8.6 mg/dL — AB (ref 8.9–10.3)
CHLORIDE: 93 mmol/L — AB (ref 101–111)
CO2: 27 mmol/L (ref 22–32)
CREATININE: 0.81 mg/dL (ref 0.61–1.24)
GFR calc non Af Amer: >60 mL/min (ref >60)
Glucose, Bld: 256 mg/dL — ABNORMAL HIGH (ref 65–99)
Potassium: 4.6 mmol/L (ref 3.5–5.1)
Sodium: 128 mmol/L — ABNORMAL LOW (ref 135–145)

## 2015-09-27 LAB — URINALYSIS, ROUTINE W REFLEX MICROSCOPIC
BILIRUBIN URINE: NEGATIVE
Glucose, UA: >1000 mg/dL — AB
Ketones, ur: 15 mg/dL — AB
Leukocytes, UA: NEGATIVE
NITRITE: NEGATIVE
Protein, ur: 100 mg/dL — AB
SPECIFIC GRAVITY, URINE: 1.036 — AB (ref 1.005–1.030)
pH: 6 (ref 5.0–8.0)

## 2015-09-27 LAB — I-STAT CHEM 8, ED
BUN: 14 mg/dL (ref 6–20)
CALCIUM ION: 1.06 mmol/L — AB (ref 1.13–1.30)
CHLORIDE: 93 mmol/L — AB (ref 101–111)
Creatinine, Ser: 0.5 mg/dL — ABNORMAL LOW (ref 0.61–1.24)
Glucose, Bld: 246 mg/dL — ABNORMAL HIGH (ref 65–99)
HCT: 54 % — ABNORMAL HIGH (ref 39.0–52.0)
Hemoglobin: 18.4 g/dL — ABNORMAL HIGH (ref 13.0–17.0)
POTASSIUM: 4.7 mmol/L (ref 3.5–5.1)
SODIUM: 128 mmol/L — AB (ref 135–145)
TCO2: 23 mmol/L (ref 0–100)

## 2015-09-27 LAB — I-STAT TROPONIN, ED: TROPONIN I, POC: 0.02 ng/mL (ref 0.00–0.08)

## 2015-09-27 LAB — URINE MICROSCOPIC-ADD ON

## 2015-09-27 LAB — TROPONIN I: TROPONIN I: 0.07 ng/mL — AB (ref <0.03)

## 2015-09-27 LAB — BRAIN NATRIURETIC PEPTIDE: B NATRIURETIC PEPTIDE 5: 1788.2 pg/mL — AB (ref 0.0–100.0)

## 2015-09-27 LAB — HEPATIC FUNCTION PANEL
ALBUMIN: 2.9 g/dL — AB (ref 3.5–5.0)
ALT: 525 U/L — ABNORMAL HIGH (ref 17–63)
AST: 135 U/L — ABNORMAL HIGH (ref 15–41)
Alkaline Phosphatase: 193 U/L — ABNORMAL HIGH (ref 38–126)
BILIRUBIN TOTAL: 1.9 mg/dL — AB (ref 0.3–1.2)
Bilirubin, Direct: 0.6 mg/dL — ABNORMAL HIGH (ref 0.1–0.5)
Indirect Bilirubin: 1.3 mg/dL — ABNORMAL HIGH (ref 0.3–0.9)
TOTAL PROTEIN: 6.3 g/dL — AB (ref 6.5–8.1)

## 2015-09-27 LAB — GLUCOSE, POCT (MANUAL RESULT ENTRY): POC Glucose: 298 mg/dl — AB (ref 70–99)

## 2015-09-27 LAB — STREP PNEUMONIAE URINARY ANTIGEN: STREP PNEUMO URINARY ANTIGEN: NEGATIVE

## 2015-09-27 LAB — GLUCOSE, CAPILLARY: Glucose-Capillary: 304 mg/dL — ABNORMAL HIGH (ref 65–99)

## 2015-09-27 LAB — CREATININE, SERUM
CREATININE: 0.79 mg/dL (ref 0.61–1.24)
GFR calc Af Amer: >60 mL/min (ref >60)
GFR calc non Af Amer: >60 mL/min (ref >60)

## 2015-09-27 MED ORDER — HEPARIN SODIUM (PORCINE) 5000 UNIT/ML IJ SOLN
5000.0000 [IU] | Freq: Three times a day (TID) | INTRAMUSCULAR | Status: DC
  Administered 2015-09-27 – 2015-09-28 (×2): 5000 [IU] via SUBCUTANEOUS
  Filled 2015-09-27 (×2): qty 1

## 2015-09-27 MED ORDER — ONDANSETRON HCL 4 MG/2ML IJ SOLN: 4.0000 mg | Freq: Four times a day (QID) | INTRAMUSCULAR | Status: DC | PRN

## 2015-09-27 MED ORDER — ASPIRIN EC 81 MG PO TBEC
81.0000 mg | DELAYED_RELEASE_TABLET | Freq: Every day | ORAL | Status: DC
  Administered 2015-09-27 – 2015-10-07 (×11): 81 mg via ORAL
  Filled 2015-09-27 (×11): qty 1

## 2015-09-27 MED ORDER — FUROSEMIDE 10 MG/ML IJ SOLN: 20.0000 mg | Freq: Two times a day (BID) | INTRAMUSCULAR | Status: DC

## 2015-09-27 MED ORDER — INSULIN ASPART 100 UNIT/ML ~~LOC~~ SOLN
0.0000 [IU] | Freq: Three times a day (TID) | SUBCUTANEOUS | Status: DC
Start: 2015-09-28 — End: 2015-10-07
  Administered 2015-09-28: 8 [IU] via SUBCUTANEOUS
  Administered 2015-09-28 – 2015-09-29 (×3): 3 [IU] via SUBCUTANEOUS
  Administered 2015-09-29: 5 [IU] via SUBCUTANEOUS
  Administered 2015-09-29: 3 [IU] via SUBCUTANEOUS
  Administered 2015-09-30: 5 [IU] via SUBCUTANEOUS
  Administered 2015-09-30: 8 [IU] via SUBCUTANEOUS
  Administered 2015-09-30: 5 [IU] via SUBCUTANEOUS
  Administered 2015-10-01: 2 [IU] via SUBCUTANEOUS
  Administered 2015-10-01: 15 [IU] via SUBCUTANEOUS
  Administered 2015-10-01: 3 [IU] via SUBCUTANEOUS
  Administered 2015-10-02 (×2): 8 [IU] via SUBCUTANEOUS
  Administered 2015-10-02: 2 [IU] via SUBCUTANEOUS
  Administered 2015-10-03: 8 [IU] via SUBCUTANEOUS
  Administered 2015-10-03: 3 [IU] via SUBCUTANEOUS
  Administered 2015-10-03: 5 [IU] via SUBCUTANEOUS
  Administered 2015-10-04: 8 [IU] via SUBCUTANEOUS
  Administered 2015-10-04 – 2015-10-05 (×4): 5 [IU] via SUBCUTANEOUS
  Administered 2015-10-06: 3 [IU] via SUBCUTANEOUS
  Administered 2015-10-07: 5 [IU] via SUBCUTANEOUS

## 2015-09-27 MED ORDER — SODIUM CHLORIDE 0.9 % IV SOLN
2000.0000 mg | Freq: Once | INTRAVENOUS | Status: AC
  Administered 2015-09-27: 2000 mg via INTRAVENOUS
  Filled 2015-09-27: qty 2000

## 2015-09-27 MED ORDER — INSULIN ASPART 100 UNIT/ML ~~LOC~~ SOLN
0.0000 [IU] | Freq: Every day | SUBCUTANEOUS | Status: DC
Start: 2015-09-27 — End: 2015-10-07
  Administered 2015-09-27: 5 [IU] via SUBCUTANEOUS
  Administered 2015-09-29 – 2015-10-04 (×5): 2 [IU] via SUBCUTANEOUS
  Administered 2015-10-05: 3 [IU] via SUBCUTANEOUS
  Administered 2015-10-06: 2 [IU] via SUBCUTANEOUS

## 2015-09-27 MED ORDER — HYDRALAZINE HCL 20 MG/ML IJ SOLN
5.0000 mg | INTRAMUSCULAR | Status: DC | PRN
  Administered 2015-10-06: 17:00:00 5 mg via INTRAVENOUS
  Filled 2015-09-27: qty 1

## 2015-09-27 MED ORDER — ONDANSETRON HCL 4 MG PO TABS: 4.0000 mg | ORAL_TABLET | Freq: Four times a day (QID) | ORAL | Status: DC | PRN

## 2015-09-27 MED ORDER — SODIUM CHLORIDE 0.9% FLUSH
3.0000 mL | Freq: Two times a day (BID) | INTRAVENOUS | Status: DC
  Administered 2015-09-27 – 2015-10-06 (×15): 3 mL via INTRAVENOUS

## 2015-09-27 MED ORDER — SODIUM CHLORIDE 0.9 % IV SOLN: 250.0000 mL | INTRAVENOUS | Status: DC | PRN

## 2015-09-27 MED ORDER — VANCOMYCIN HCL IN DEXTROSE 1-5 GM/200ML-% IV SOLN
1000.0000 mg | Freq: Two times a day (BID) | INTRAVENOUS | Status: DC
  Administered 2015-09-28 – 2015-09-29 (×3): 1000 mg via INTRAVENOUS
  Filled 2015-09-27 (×4): qty 200

## 2015-09-27 MED ORDER — POLYETHYLENE GLYCOL 3350 17 G PO PACK: 17.0000 g | PACK | Freq: Every day | ORAL | Status: DC | PRN

## 2015-09-27 MED ORDER — SODIUM CHLORIDE 0.9% FLUSH: 3.0000 mL | INTRAVENOUS | Status: DC | PRN

## 2015-09-27 MED ORDER — PIPERACILLIN-TAZOBACTAM 3.375 G IVPB 30 MIN
3.3750 g | Freq: Once | INTRAVENOUS | Status: AC
  Administered 2015-09-27: 3.375 g via INTRAVENOUS
  Filled 2015-09-27: qty 50

## 2015-09-27 MED ORDER — IOPAMIDOL (ISOVUE-370) INJECTION 76%
INTRAVENOUS | Status: AC
  Administered 2015-09-27: 100 mL
  Filled 2015-09-27: qty 100

## 2015-09-27 MED ORDER — PIPERACILLIN-TAZOBACTAM 3.375 G IVPB
3.3750 g | Freq: Three times a day (TID) | INTRAVENOUS | Status: DC
  Administered 2015-09-27 – 2015-09-28 (×4): 3.375 g via INTRAVENOUS
  Filled 2015-09-27 (×7): qty 50

## 2015-09-27 MED ORDER — FUROSEMIDE 10 MG/ML IJ SOLN
20.0000 mg | Freq: Two times a day (BID) | INTRAMUSCULAR | Status: DC
  Administered 2015-09-27 – 2015-09-28 (×2): 20 mg via INTRAVENOUS
  Filled 2015-09-27 (×2): qty 2

## 2015-09-27 MED ORDER — SODIUM CHLORIDE 0.9% FLUSH
3.0000 mL | Freq: Two times a day (BID) | INTRAVENOUS | Status: DC
  Administered 2015-09-27 – 2015-10-04 (×12): 3 mL via INTRAVENOUS

## 2015-09-27 NOTE — Progress Notes (Addendum)
By signing my name below, I, [name], attest that this documentation has been prepared under the direction and in the presence of [name], MD.  Electronically Signed: [name], Medical Scribe. 09/27/2015. 1:49 PM.  Subjective:    Patient ID: Robert Hampton, male    DOB: [birth year], 60 y.o.   MRN: [id number]  HPI Chief Complaint  Patient presents with  . Follow-up    pneumonia, not feeling any better  . Edema    Abdomen and right leg    HPI Comments: Robert GeneraJonathan Davern is a 60 y.o. male who presents to the [facility] for a follow-up. Was asked to see pt acutely due to symptoms and elevated blood pressure. Pt was seen 8 days ago with SOB after driving 3 hours to visit a friend- progressive SOB, he was tachycardic at that time but nl temp, elevated WBC 13.6, and  Glucose 386- looks like at Aug 2015 noted to have blood sugar of 290 and was advised to follow up with primary care. CXR with chronic bronchitis with PNA. He was started on Glipizide 5 mg BID ,a shot of Rocephin 1 g, and put on Levoquin 500 mg QD for 7 days. Was advised to follow-up in 48 hours. He also had elevated LFTs - recommended recheck in 1 week for these. Pt states his cough has slightly improved since his last office visit. Pt reports intermittent chest pain that started 5 days ago, after initial office visit. Pt reports pain with breathing, and coughing. Pt states he can't sit up for too long without feeling tired. Pt can't breath when laying down and causes him to feel fatigue. Pt reports weakness, emesis, nausea, and diaphoresis. Pt had 2 emesis episodes 3 and 4 days ago with clear vomitous. Pt didn't feel better after his emesis episodes. Pt reports swelling in his abdomen, and legs R>L.  SHx: Pt sells wine. Pt denies having any PMHx of CVA, heart disease, or liver problems. Pt drinks a bottle of wine a day. Pt never had a problem with DUIs. Pt hasn't been checking his blood sugar. Pt has  not drank any alcohol this week. Pt denies having any with drawel symptoms if he stopped drinking.  Patient Active Problem List   Diagnosis Date Noted  . Uncontrolled type 2 diabetes mellitus with ketoacidosis without coma, without long-term current use of insulin (HCC) 09/19/2015   Past Medical History  Diagnosis Date  . Diverticulosis     shown on CT   Past Surgical History  Procedure Laterality Date  . Knee surgery     Allergies  Allergen Reactions  . Bee Venom Anaphylaxis   Prior to Admission medications   Medication Sig Start Date End Date Taking? Authorizing Provider  glipiZIDE (GLUCOTROL) 5 MG tablet Take 1 tablet (5 mg total) by mouth 2 (two) times daily before a meal. 09/19/15  Yes Elvina SidleKurt Lauenstein, MD  ibuprofen (ADVIL,MOTRIN) 200 MG tablet Take 600 mg by mouth every 6 (six) hours as needed for moderate pain. Reported on 09/27/2015   Yes Historical Provider, MD  hydrochlorothiazide (HYDRODIURIL) 25 MG tablet Take 1 tablet (25 mg total) by mouth daily. Patient not taking: Reported on 09/19/2015 11/18/13   Linwood DibblesJon Knapp, MD  lansoprazole (PREVACID SOLUTAB) 30 MG disintegrating tablet Take 1 tablet (30 mg total) by mouth daily at 12 noon. Patient not taking: Reported on 09/19/2015 11/18/13   Linwood DibblesJon Knapp, MD   Social History   Social History  . Marital Status: Married  Spouse Name: N/A  . Number of Children: N/A  . Years of Education: N/A   Occupational History  . Not on file.   Social History Main Topics  . Smoking status: Current Every Day Smoker  . Smokeless tobacco: Not on file  . Alcohol Use: Yes     Comment: daily   . Drug Use: No  . Sexual Activity: Not on file   Other Topics Concern  . Not on file   Social History Narrative   Review of Systems  Constitutional: Positive for diaphoresis and fatigue. Negative for fever.  Respiratory: Positive for cough and shortness of breath.   Cardiovascular: Positive for chest pain and leg swelling.  Gastrointestinal:  Positive for nausea, vomiting and abdominal distention.  Neurological: Positive for weakness.  Psychiatric/Behavioral: Positive for sleep disturbance.   Objective:  BP 146/92 mmHg  Pulse 116  Temp(Src) 98.9 F (37.2 C) (Oral)  Resp 21  Ht 6' (1.829 m)  Wt 250 lb (113.399 kg)  BMI 33.90 kg/m2  SpO2 97%  Physical Exam  Constitutional: He appears well-developed and well-nourished. No distress.  HENT:  Head: Normocephalic and atraumatic.  Mouth/Throat: Oropharynx is clear and moist.  Eyes: Conjunctivae are normal.  Neck: Neck supple.  Cardiovascular: Regular rhythm.  Tachycardia present.   Heart regular rhythm but tachycardic  Pulmonary/Chest: Effort normal.  Distant but overall clear breath sounds  Abdominal:  Diffuse abdominal distention Slight discomfort LUQ RUQ non tender Positive fluid shift  Musculoskeletal:  2-3+ pitting edema that extends up to at least his knee on the right 2+ pitting edema to the proximal tibia on the left. Right calf tender. Left calf non tender  Neurological: He is alert.  Skin: Skin is warm and dry.  Psychiatric: He has a normal mood and affect. His behavior is normal.  Nursing note and vitals reviewed.  Wt Readings from Last 3 Encounters:  09/27/15 250 lb (113.399 kg)   EKG Reading: Low voltage, nonspecific T waves, but no other acute findings otherwise.  Dg Chest 2 View  09/27/2015  CLINICAL DATA:  Cough and shortness of breath, progressing EXAM: CHEST  2 VIEW COMPARISON:  September 19, 2015 FINDINGS: There is now airspace consolidation throughout the posterior segment of the left upper lobe. There is slight atelectasis in the left base. The right lung is clear. Heart size and pulmonary vascularity are within normal limits. No adenopathy. There is a benign exostosis arising from the inferior anterior right first rib. There is degenerative change in the thoracic spine. IMPRESSION: Airspace consolidation throughout the posterior segment of the left  upper lobe consistent with pneumonia. Mild left base atelectasis. Right lung clear. Stable cardiac silhouette. These results will be called to the ordering clinician or representative by the Radiologist Assistant, and communication documented in the PACS or zVision Dashboard. Electronically Signed   By: Bretta Bang III M.D.   On: 09/27/2015 14:35   Results for orders placed or performed in visit on 09/27/15  POCT glucose (manual entry)  Result Value Ref Range   POC Glucose 298 (A) 70 - 99 mg/dl  POCT CBC  Result Value Ref Range   WBC 19.9 (A) 4.6 - 10.2 K/uL   Lymph, poc 1.7 0.6 - 3.4   POC LYMPH PERCENT 8.5 (A) 10 - 50 %L   MID (cbc) 1.1 (A) 0 - 0.9   POC MID % 5.7 0 - 12 %M   POC Granulocyte 17.1 (A) 2 - 6.9   Granulocyte percent 85.8 (A) 37 -  80 %G   RBC 5.37 4.69 - 6.13 M/uL   Hemoglobin 17.3 14.1 - 18.1 g/dL   HCT, POC 16.1 09.6 - 53.7 %   MCV 92.8 80 - 97 fL   MCH, POC 32.2 (A) 27 - 31.2 pg   MCHC 34.7 31.8 - 35.4 g/dL   RDW, POC 04.5 %   Platelet Count, POC 212 142 - 424 K/uL   MPV 8.3 0 - 99.8 fL   Assessment & Plan:  Adriene Padula is a 60 y.o. male Dyspnea - Plan: POCT CBC, DG Chest 2 View, EKG 12-Lead Chest pain, unspecified chest pain type - Plan: POCT CBC, DG Chest 2 View, EKG 12-Lead Bilateral leg edema Right calf pain Cough - Plan: POCT CBC  - Now over 2 weeks of cough, progressive, now with left upper lobe infiltrate after 1 week of Levaquin. Worsening leukocytosis, chest pain and dyspnea. Associated right calf greater than left leg pain and swelling, concerning for DVT/PE versus pneumonia and secondary process.. Also with diffuse abdominal distention, likely ascites and with lower extremity edema concerning for CHF, but not seen on chest x-ray.   - We'll have evaluated further through the emergency room. Offered EMS transport, but was declined, 911 precautions discussed on the way to the ER. Triage nurse was advised.  Hyperglycemia - Plan: POCT glucose  (manual entry)  - Uncontrolled diabetes, and on further review, appears to be present since ER evaluation 2015. Only recently started glipizide 5 mg twice a day. Further evaluation to the hospital.  Abdominal distension Elevated LFTs  - History of alcohol use, overuse, with elevated LFTs last visit. Concerning for acute hepatitis versus ascites with alcoholic liver disease possible. Further evaluation through ER/hospital.  No orders of the defined types were placed in this encounter.   Patient Instructions       IF you received an x-ray today, you will receive an invoice from [facility]. Please contact [facility] at [phone] with questions or concerns regarding your invoice.   IF you received labwork today, you will receive an invoice from [employer]. Please contact [facility] at [phone] with questions or concerns regarding your invoice.   Our billing staff will not be able to assist you with questions regarding bills from these companies.  You will be contacted with the lab results as soon as they are available. The fastest way to get your results is to activate your My Chart account. Instructions are located on the last page of this paperwork. If you have not heard from [place] regarding the results in 2 weeks, please contact this office.    I'm concerned that you have a new area of infiltrate or pneumonia in the left upper chest, after completion of antibiotic. With your leg swelling and chest pain, this could also be due to a blood clot. The swelling in your abdomen and legs may also be due to your liver, but other testing can be done through the hospital. You will need further evaluation today in the emergency room and most likely will be hospitalized. Proceed directly to the emergency room after our office, but if any change or worsening of symptoms in route, call 911.  Additionally your blood sugar is still high with uncontrolled  diabetes which can also be discussed in the hospital.      I personally performed the services described in this documentation, which was scribed in my presence. The recorded information has been reviewed and considered, and addended by me as  needed.   Signed,   Meredith StaggersJeffrey Kailani Brass, MD [facility].  09/27/2015 3:36 PM

## 2015-09-27 NOTE — ED Notes (Signed)
Pt given turkey sandwich and gingerale. 

## 2015-09-27 NOTE — Patient Instructions (Addendum)
     IF you received an x-ray today, you will receive an invoice from [facility]. Please contact [facility] at [phone] with questions or concerns regarding your invoice.   IF you received labwork today, you will receive an invoice from [facility]. Please contact [facility] at [phone] with questions or concerns regarding your invoice.   Our billing staff will not be able to assist you with questions regarding bills from these companies.  You will be contacted with the lab results as soon as they are available. The fastest way to get your results is to activate your My Chart account. Instructions are located on the last page of this paperwork. If you have not heard from us regarding the results in 2 weeks, please contact this office.    I'm concerned that you have a new area of infiltrate or pneumonia in the left upper chest, after completion of antibiotic. With your leg swelling and chest pain, this could also be due to a blood clot. The swelling in your abdomen and legs may also be due to your liver, but other testing can be done through the hospital. You will need further evaluation today in the emergency room and most likely will be hospitalized. Proceed directly to the emergency room after our office, but if any change or worsening of symptoms in route, call 911.  Additionally your blood sugar is still high with uncontrolled diabetes which can also be discussed in the hospital.

## 2015-09-27 NOTE — Telephone Encounter (Signed)
Pt is not feeling any better despite finishing the antibiotic. Pt wants to know the Dr.'s recommendation.

## 2015-09-27 NOTE — ED Notes (Signed)
Pt sent from medical medicine clinic, pt recently treated for a pneumonia that per pt is no better and has been having intermittent sharp chest pains for the last week with shortness of breath. Pt also has swelling in both lower extremities and abd.

## 2015-09-27 NOTE — Telephone Encounter (Signed)
Advised wife to have pt come back to be seen.

## 2015-09-27 NOTE — ED Notes (Signed)
Attempted report 

## 2015-09-27 NOTE — Progress Notes (Signed)
ANTIBIOTIC CONSULT NOTE - INITIAL  Pharmacy Consult for vancomycin and zosyn Indication: pneumonia  Allergies  Allergen Reactions  . Bee Venom Anaphylaxis    Patient Measurements: Height: 5\' 9"  (175.3 cm) Weight: 250 lb (113.399 kg) IBW/kg (Calculated) : 70.7 Adjusted Body Weight:   Vital Signs: Temp: 98.2 F (36.8 C) (06/28 1546) Temp Source: Oral (06/28 1546) BP: 171/108 mmHg (06/28 1935) Pulse Rate: 110 (06/28 1935) Intake/Output from previous day:   Intake/Output from this shift:    Labs:  Recent Labs  09/27/15 1447 09/27/15 1548 09/27/15 1815  WBC 19.9* 19.8*  --   HGB 17.3 16.9 18.4*  PLT  --  210  --   CREATININE  --  0.81 0.50*   Estimated Creatinine Clearance: 125 mL/min (by C-G formula based on Cr of 0.5). No results for input(s): VANCOTROUGH, VANCOPEAK, VANCORANDOM, GENTTROUGH, GENTPEAK, GENTRANDOM, TOBRATROUGH, TOBRAPEAK, TOBRARND, AMIKACINPEAK, AMIKACINTROU, AMIKACIN in the last 72 hours.   Microbiology: No results found for this or any previous visit (from the past 720 hour(s)).  Medical History: Past Medical History  Diagnosis Date  . Diverticulosis     shown on CT    Medications:  Scheduled:  . aspirin EC  81 mg Oral Daily  . [START ON 09/28/2015] furosemide  20 mg Intravenous BID  . heparin  5,000 Units Subcutaneous Q8H  . [START ON 09/28/2015] insulin aspart  0-15 Units Subcutaneous TID WC  . insulin aspart  0-5 Units Subcutaneous QHS  . sodium chloride flush  3 mL Intravenous Q12H  . sodium chloride flush  3 mL Intravenous Q12H   Infusions:   Assessment: 60 yo male with pneumonia will be started on vancomycin and zosyn.  Patient received one dose of vancomycin 2g and zosyn 3.375g ~1800.  Goal of Therapy:  Vancomycin trough level 15-20 mcg/ml  Plan:  - zosyn 3.375g iv q8h (4hr infusion), next dose at midnight - vancomycin 1g iv q12h, 1st dose at 0600 on 09/28/15  Kamil Hanigan, Tsz-Yin 09/27/2015,8:27 PM

## 2015-09-27 NOTE — H&P (Signed)
[facility] Ad[facility] History and Physical Service Pager: [phone]  Patient name: Robert GeneraJonathan Rosiak Medical record number: [id number] Date of birth: 1956/03/24 Age: 60 y.o. Gender: male  Primary Care Provider: No PCP Per Patient Consultants: None Code Status: Full  Chief Complaint: Shortness of breath  Assessment and Plan: Robert Hampton is a 60 y.o. male presenting with shortness of breath and cough. PMH is significant for T2DM, tobacco abuse.  Shortness of Breath: Concern for PE vs new CHF vs pneumonia. Pt initially thought to have pneumonia, treated with Levaquin. He endorses subjective fevers/chills and his CXR from today is consistent with a left upper lobe pneumonia. His shortness of breath has not resolved with Levaquin and Pt states that his cough has never been productive of sputum. I am concerned that there is something else going on in addition to or instead of the pneumonia. He may have a PE. Pt has had pain in the back of his calf for the last few weeks (noted after a prolonged car ride) and subsequently developed shortness of breath and cough. On exam, his right lower extremity is swollen, erythematous, and mildly warm to the touch. Homan's sign is positive. He is also tachycardic. Wells score is 9. We are concerned that he might have right heart failure that's causing congestion and a subsequent transaminitis (Congestive Hepatopathy). He might also have new CHF, given his basilar crackles and bilateral lower extremity edema (although RLE is more edematous than LLE). BNP was 1788 in the ED. ACS is another possibility, although Pt denies exertional chest pain. Troponin 0.02, EKG with sinus tachycardia and left atrial enlargement. - Admit to telemetry, attending Dr. Gwendolyn GrantWalden. - Will get CTA chest and LE dopplers to rule out DVT/PE  - Will initiate heparin drip if positive - ECHO ordered (to assess EF, heart strain, constrictive pericarditis, or tricuspid  regurgitation)  - Continue Vanc/Zosyn for now. Will consider stopping or de-escalating in the morning. - Urinary Strep and Legionella  - Blood cultures drawn in ED are pending - Will give Lasix 20mg  x 1 now and continue bid for fluid overload - Trend troponins and obtain AM EKG to rule out ACS - Cardiac monitoring and continuous pulse ox  Lower extremity edema: Pitting edema to the knee R > L. RLE is erythematous and mildly warm to the touch. Homan's sign positive. Right calf tender to palpation. - LE dopplers to rule out DVT - Will give Lasix 20mg  x 1 now and continue bid tomorrow - UA to rule out nephrotic syndrome, especially given his low albumin 2.9. - Strict I/O - Daily weights  Elevated Liver Enzymes / Bilirubin / Jaundice: No abdominal pain. AST 135, ALT 525. Total bilirubin 1.9. Drinks 2-3 glasses of wine per day. Liver injury may be secondary to alcohol use vs hepatic congestion vs acute hepatitis. - Will get Hepatitis panel - TSH, Ammonia, PT/INR - Repeat AM CMET  Type 2 DM: Diagnosed 1 week ago. Glucose 298. - A1c ordered - Moderate SSI - CBGs with meals and at bedtime  Hyponatremia: Na 128. Likely secondary to fluid overloaded or beer Marland Kitchen(Robert Hampton/wine) potomania. Anticipate this will improve with diuresis. - Lasix 20mg  IV bid - Repeat AM CMET  Hypertension: BP 160/110 in the ED. Likely worsened with continued cough and fluid overloaded state. - Hydralazine prn  Tobacco abuse:  - Declines Nicotine patch at this time.  Alcohol use: Drinks 2-3 glasses of wine per day. - CIWA  FEN/GI: Heart healthy diet, Miralax prn, SLIV  Prophylaxis: Heparin sq for now.  Disposition: Admit to inpatient, attending Dr. Gwendolyn Grant.  History of Present Illness:  Robert Hampton is a 60 y.o. male presenting with cough and shortness of breath. He developed persistent cough a few weeks ago. He then developed shortness of breath a week ago. He went to the doctor and was told he had pneumonia. He was  started on Levaquin. After 3 days, he felt great for a little while and then felt terrible. He then stayed in bed for a week. He went back to the doctor today and was sent to the ED. He endorses shortness of breath with exertion, orthopnea, PND. He has had subjective fevers and chills. The cough is dry. He has had post-tussive emesis x 2. He hasn't been eating and drinking much. He endorses chest pain with cough. He thinks his breathing has gotten worse over the last few weeks. He has never had problems with his breathing before. He has had swelling of his stomach and lower extremities. He normally has mild swelling in the right leg from an accident 25 years ago. He has had pain behind his right calf for 1 month. The pain is "unbearable". He did not have any long car trips or prolonged sitting at that time. He stands and walks a lot at his job. His wife has been massaging his calf with essential oils, but it has not helped. He denies abdominal pain, but endorses abdominal swelling. Per wife, he doesn't look like his normal self. His abdomen, neck, and face are all more swollen than usual.  In the ED, Pt afebrile with O2 sats 94-98% on room air. CXR showed airspace consolidation of the left upper lobe consistent with pneumonia. WBC 19.9. BNP 1788. Troponin 0.02. AST 135, ALT 525, indirect bili 1.3, direct bili 1.9. Pt given Vanc/Zosyn for failed outpatient treatment with Levaquin.   Review Of Systems: Per HPI with the following additions: -Headaches, -blurry, -rashes, -diarrhea, -constipation, -dysuria, -hematuria, -hematochezia, -melena. Otherwise the remainder of the systems were negative.  Patient Active Problem List   Diagnosis Date Noted  . Uncontrolled type 2 diabetes mellitus with ketoacidosis without coma, without long-term current use of insulin (HCC) 09/19/2015    Past Medical History: Past Medical History  Diagnosis Date  . Diverticulosis     shown on CT    Past Surgical History: Past  Surgical History  Procedure Laterality Date  . Knee surgery      Social History: Social History  Substance Use Topics  . Smoking status: Current Every Day Smoker  . Smokeless tobacco: None  . Alcohol Use: Yes     Comment: daily    Additional social history: He has been cutting down on cigarettes. He stopped smoking a couple weeks ago. 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 Marijjuana very occasionally. Please also refer to relevant sections of EMR.  Family History: Mom- HTN No Fhx of lung problems.  Allergies and Medications: Allergies  Allergen Reactions  . Bee Venom Anaphylaxis   No current facility-administered medications on file prior to encounter.   Current Outpatient Prescriptions on File Prior to Encounter  Medication Sig Dispense Refill  . glipiZIDE (GLUCOTROL) 5 MG tablet Take 1 tablet (5 mg total) by mouth 2 (two) times daily before a meal. 60 tablet 3  . ibuprofen (ADVIL,MOTRIN) 200 MG tablet Take 600 mg by mouth every 6 (six) hours as needed for moderate pain. Reported on 09/27/2015  Objective: BP 160/110 mmHg  Pulse 109  Temp(Src) 98.2 F (36.8 C) (Oral)  Resp 26  Ht 5\' 9"  (1.753 m)  Wt 250 lb (113.399 kg)  BMI 36.90 kg/m2  SpO2 96% Exam: General: Tired-appearing male, coughing throughout exam, pleasant Eyes: PERRLA, EOMI, scleral icterus present ENTM: Nose normal, oropharynx clear Neck: Supple, no cervical lymphadenopathy Cardiovascular: RRR, no murmurs Respiratory: Mildly increased work of breathing worse after coughing, bibasilar crackles present, good air movement in all lung fields. Abdomen: +BS, soft but distended, no tenderness to palpation, fluid shift present, no hepatomegaly appreciated MSK: 2-3+ pitting edema to the knee R > L, right lower extremity is erythematous and mildly warm to the touch. Skin: Jaundice, no rashes, scrotal edema present. Neuro: Awake, alert, oriented x 3, CN 2-12 grossly intact, no focal  deficits, had some difficulty w/ word finding on more than one occasion.  Psych: Appropriate affect, normal behavior  Labs and Imaging: CBC BMET   Recent Labs Lab 09/27/15 1548 09/27/15 1815  WBC 19.8*  --   HGB 16.9 18.4*  HCT 49.7 54.0*  PLT 210  --     Recent Labs Lab 09/27/15 1548 09/27/15 1815  NA 128* 128*  K 4.6 4.7  CL 93* 93*  CO2 27  --   BUN 13 14  CREATININE 0.81 0.50*  GLUCOSE 256* 246*  CALCIUM 8.6*  --      BNP 1788 Troponin 0.02  CXR: Airspace consolidation of the left upper lobe consistent with pneumonia.  Campbell StallKaty Dodd Mayo, MD 09/27/2015, 6:26 PM PGY-1, [facility] FPTS Intern pager: [phone], text pages welcome   Upper Level Addendum:  I have seen and evaluated this patient along with Dr. Nancy Hampton and reviewed the above note, making necessary revisions in red.   Robert DeltonIan D Preciliano Castell, MD,MS,  PGY2 09/27/2015 10:16 PM

## 2015-09-28 ENCOUNTER — Inpatient Hospital Stay (HOSPITAL_COMMUNITY): Payer: MEDICAID

## 2015-09-28 ENCOUNTER — Inpatient Hospital Stay (HOSPITAL_COMMUNITY): Payer: Self-pay

## 2015-09-28 DIAGNOSIS — M7989 Other specified soft tissue disorders: Secondary | ICD-10-CM

## 2015-09-28 DIAGNOSIS — R0602 Shortness of breath: Secondary | ICD-10-CM

## 2015-09-28 DIAGNOSIS — J9601 Acute respiratory failure with hypoxia: Secondary | ICD-10-CM

## 2015-09-28 DIAGNOSIS — R748 Abnormal levels of other serum enzymes: Secondary | ICD-10-CM

## 2015-09-28 DIAGNOSIS — I5021 Acute systolic (congestive) heart failure: Secondary | ICD-10-CM

## 2015-09-28 DIAGNOSIS — R740 Nonspecific elevation of levels of transaminase and lactic acid dehydrogenase [LDH]: Secondary | ICD-10-CM

## 2015-09-28 DIAGNOSIS — R7401 Elevation of levels of liver transaminase levels: Secondary | ICD-10-CM | POA: Insufficient documentation

## 2015-09-28 DIAGNOSIS — R0600 Dyspnea, unspecified: Secondary | ICD-10-CM

## 2015-09-28 LAB — TROPONIN I
TROPONIN I: 0.06 ng/mL — AB (ref <0.03)
TROPONIN I: 0.07 ng/mL — AB (ref <0.03)
TROPONIN I: 0.12 ng/mL — AB (ref <0.03)
Troponin I: 0.1 ng/mL (ref <0.03)

## 2015-09-28 LAB — COMPREHENSIVE METABOLIC PANEL
ALK PHOS: 149 U/L — AB (ref 38–126)
ALT: 424 U/L — AB (ref 17–63)
AST: 104 U/L — AB (ref 15–41)
Albumin: 2.6 g/dL — ABNORMAL LOW (ref 3.5–5.0)
Anion gap: 7 (ref 5–15)
BUN: 13 mg/dL (ref 6–20)
CALCIUM: 8.2 mg/dL — AB (ref 8.9–10.3)
CHLORIDE: 98 mmol/L — AB (ref 101–111)
CO2: 23 mmol/L (ref 22–32)
CREATININE: 0.79 mg/dL (ref 0.61–1.24)
Glucose, Bld: 174 mg/dL — ABNORMAL HIGH (ref 65–99)
Potassium: 4.3 mmol/L (ref 3.5–5.1)
Sodium: 128 mmol/L — ABNORMAL LOW (ref 135–145)
Total Bilirubin: 1.5 mg/dL — ABNORMAL HIGH (ref 0.3–1.2)
Total Protein: 5.4 g/dL — ABNORMAL LOW (ref 6.5–8.1)

## 2015-09-28 LAB — ECHOCARDIOGRAM COMPLETE
HEIGHTINCHES: 72 in
WEIGHTICAEL: 3944 [oz_av]

## 2015-09-28 LAB — GLUCOSE, CAPILLARY
GLUCOSE-CAPILLARY: 196 mg/dL — AB (ref 65–99)
GLUCOSE-CAPILLARY: 275 mg/dL — AB (ref 65–99)
GLUCOSE-CAPILLARY: 183 mg/dL — AB (ref 65–99)
GLUCOSE-CAPILLARY: 173 mg/dL — AB (ref 65–99)

## 2015-09-28 LAB — CBC
HCT: 46.8 % (ref 39.0–52.0)
Hemoglobin: 15.9 g/dL (ref 13.0–17.0)
MCH: 31.7 pg (ref 26.0–34.0)
MCHC: 34 g/dL (ref 30.0–36.0)
MCV: 93.2 fL (ref 78.0–100.0)
PLATELETS: 204 10*3/uL (ref 150–400)
RBC: 5.02 MIL/uL (ref 4.22–5.81)
RDW: 14.3 % (ref 11.5–15.5)
WBC: 19.7 10*3/uL — AB (ref 4.0–10.5)

## 2015-09-28 LAB — D-DIMER, QUANTITATIVE: D-Dimer, Quant: 5.73 ug/mL-FEU — ABNORMAL HIGH (ref 0.00–0.50)

## 2015-09-28 LAB — PROCALCITONIN: Procalcitonin: 0.21 ng/mL

## 2015-09-28 LAB — PROTIME-INR
INR: 1.93 — AB (ref 0.00–1.49)
PROTHROMBIN TIME: 22 s — AB (ref 11.6–15.2)

## 2015-09-28 LAB — HEPARIN LEVEL (UNFRACTIONATED)
HEPARIN UNFRACTIONATED: 0.12 [IU]/mL — AB (ref 0.30–0.70)
Heparin Unfractionated: <0.1 IU/mL — ABNORMAL LOW (ref 0.30–0.70)

## 2015-09-28 LAB — TSH: TSH: 2.456 u[IU]/mL (ref 0.350–4.500)

## 2015-09-28 LAB — STREP PNEUMONIAE URINARY ANTIGEN: Strep Pneumo Urinary Antigen: NEGATIVE

## 2015-09-28 LAB — AMMONIA: Ammonia: 32 umol/L (ref 9–35)

## 2015-09-28 MED ORDER — LISINOPRIL 2.5 MG PO TABS
2.5000 mg | ORAL_TABLET | Freq: Every day | ORAL | Status: DC
  Administered 2015-09-28 – 2015-09-29 (×2): 2.5 mg via ORAL
  Filled 2015-09-28 (×2): qty 1

## 2015-09-28 MED ORDER — LIVING WELL WITH DIABETES BOOK
Freq: Once | Status: AC
  Administered 2015-09-28: 15:00:00
  Filled 2015-09-28: qty 1

## 2015-09-28 MED ORDER — VITAMIN B-1 100 MG PO TABS
100.0000 mg | ORAL_TABLET | Freq: Every day | ORAL | Status: DC
  Administered 2015-09-28 – 2015-10-07 (×10): 100 mg via ORAL
  Filled 2015-09-28 (×10): qty 1

## 2015-09-28 MED ORDER — FUROSEMIDE 10 MG/ML IJ SOLN
40.0000 mg | Freq: Two times a day (BID) | INTRAMUSCULAR | Status: DC
  Administered 2015-09-28 – 2015-09-29 (×2): 40 mg via INTRAVENOUS
  Filled 2015-09-28 (×2): qty 4

## 2015-09-28 MED ORDER — PERFLUTREN LIPID MICROSPHERE
INTRAVENOUS | Status: AC
  Filled 2015-09-28: qty 10

## 2015-09-28 MED ORDER — HEPARIN BOLUS VIA INFUSION
4000.0000 [IU] | Freq: Once | INTRAVENOUS | Status: AC
  Administered 2015-09-28: 4000 [IU] via INTRAVENOUS
  Filled 2015-09-28: qty 4000

## 2015-09-28 MED ORDER — PERFLUTREN LIPID MICROSPHERE
1.0000 mL | INTRAVENOUS | Status: AC | PRN
  Administered 2015-09-28: 2 mL via INTRAVENOUS
  Filled 2015-09-28: qty 10

## 2015-09-28 MED ORDER — FOLIC ACID 1 MG PO TABS
1.0000 mg | ORAL_TABLET | Freq: Every day | ORAL | Status: DC
  Administered 2015-09-28 – 2015-10-07 (×10): 1 mg via ORAL
  Filled 2015-09-28 (×10): qty 1

## 2015-09-28 MED ORDER — ADULT MULTIVITAMIN W/MINERALS CH
1.0000 | ORAL_TABLET | Freq: Every day | ORAL | Status: DC
  Administered 2015-09-28 – 2015-10-07 (×10): 1 via ORAL
  Filled 2015-09-28 (×10): qty 1

## 2015-09-28 MED ORDER — LORAZEPAM 1 MG PO TABS: 1.0000 mg | ORAL_TABLET | Freq: Four times a day (QID) | ORAL | Status: AC | PRN

## 2015-09-28 MED ORDER — BENZONATATE 100 MG PO CAPS
200.0000 mg | ORAL_CAPSULE | Freq: Three times a day (TID) | ORAL | Status: DC | PRN
  Administered 2015-09-28 – 2015-10-04 (×3): 200 mg via ORAL
  Filled 2015-09-28 (×3): qty 2

## 2015-09-28 MED ORDER — HEPARIN (PORCINE) IN NACL 100-0.45 UNIT/ML-% IJ SOLN
2700.0000 [IU]/h | INTRAMUSCULAR | Status: DC
  Administered 2015-09-28: 1700 [IU]/h via INTRAVENOUS
  Administered 2015-09-29: 2500 [IU]/h via INTRAVENOUS
  Administered 2015-09-29: 2300 [IU]/h via INTRAVENOUS
  Administered 2015-09-30 – 2015-10-04 (×4): 2500 [IU]/h via INTRAVENOUS
  Filled 2015-09-28 (×14): qty 250

## 2015-09-28 MED ORDER — CARVEDILOL 3.125 MG PO TABS
3.1250 mg | ORAL_TABLET | Freq: Two times a day (BID) | ORAL | Status: DC
  Administered 2015-09-29 – 2015-10-02 (×7): 3.125 mg via ORAL
  Filled 2015-09-28 (×7): qty 1

## 2015-09-28 MED ORDER — THIAMINE HCL 100 MG/ML IJ SOLN: 100.0000 mg | Freq: Every day | INTRAMUSCULAR | Status: DC

## 2015-09-28 MED ORDER — HEPARIN BOLUS VIA INFUSION
2000.0000 [IU] | Freq: Once | INTRAVENOUS | Status: AC
  Administered 2015-09-28: 2000 [IU] via INTRAVENOUS
  Filled 2015-09-28: qty 2000

## 2015-09-28 MED ORDER — LORAZEPAM 2 MG/ML IJ SOLN: 1.0000 mg | Freq: Four times a day (QID) | INTRAMUSCULAR | Status: AC | PRN

## 2015-09-28 NOTE — Progress Notes (Signed)
Updated patient's daughter Leotis ShamesLauren over phone on patient's hospital course thus far at [phone], per family request.

## 2015-09-28 NOTE — Progress Notes (Signed)
Pt a/o, no c/o pain, PRN Tessalon pearls given, pt remains on hep gtt @ 19 ml/hr, dietary consult put in for edu on new dx of CHF and DM, voiding adequate amt of urine, VSS, pt stable

## 2015-09-28 NOTE — Progress Notes (Signed)
Received call from vascular that patient positive for DVT in his right leg.  Family medicine MD paged and made aware.  New orders received, will continue to monitor.

## 2015-09-28 NOTE — Progress Notes (Signed)
ANTICOAGULATION CONSULT NOTE - Follow Up Consult  Pharmacy Consult for heparin Indication: DVT  Allergies  Allergen Reactions  . Bee Venom Anaphylaxis    Patient Measurements: Height: 6' (182.9 cm) Weight: 246 lb 8 oz (111.812 kg) (Scale A) IBW/kg (Calculated) : 77.6 Heparin Dosing Weight: 101 kg  Vital Signs: Temp: 98.6 F (37 C) (06/29 1123) Temp Source: Oral (06/29 1123) BP: 148/95 mmHg (06/29 1123) Pulse Rate: 106 (06/29 1123)  Labs:  Recent Labs  09/27/15 1548 09/27/15 1815  09/27/15 2026 09/28/15 0219 09/28/15 0745 09/28/15 1401 09/28/15 1551  HGB 16.9 18.4*  --  16.0 15.9  --   --   --   HCT 49.7 54.0*  --  47.8 46.8  --   --   --   PLT 210  --   --  209 204  --   --   --   LABPROT  --   --   --   --  22.0*  --   --   --   INR  --   --   --   --  1.93*  --   --   --   HEPARINUNFRC  --   --   --   --   --   --   --  <0.10*  CREATININE 0.81 0.50*  --  0.79 0.79  --   --   --   TROPONINI  --   --   < > 0.07* 0.06* 0.10* 0.07*  --   < > = values in this interval not displayed.  Estimated Creatinine Clearance: 130 mL/min (by C-G formula based on Cr of 0.79).   Medications:  Scheduled:  . aspirin EC  81 mg Oral Daily  . folic acid  1 mg Oral Daily  . furosemide  20 mg Intravenous BID  . insulin aspart  0-15 Units Subcutaneous TID WC  . insulin aspart  0-5 Units Subcutaneous QHS  . multivitamin with minerals  1 tablet Oral Daily  . piperacillin-tazobactam (ZOSYN)  IV  3.375 g Intravenous Q8H  . sodium chloride flush  3 mL Intravenous Q12H  . sodium chloride flush  3 mL Intravenous Q12H  . thiamine  100 mg Oral Daily   Or  . thiamine  100 mg Intravenous Daily  . vancomycin  1,000 mg Intravenous Q12H   Infusions:  . heparin 1,700 Units/hr (09/28/15 1034)    Assessment: 60 yo male with DVT is currently on subtherapeutic heparin.  Heparin level is <0.1.  Confirmed with RN that no problem with infusion.   Goal of Therapy:  Heparin level 0.3-0.7  units/ml Monitor platelets by anticoagulation protocol: Yes   Plan:  - Give 2000 units bolus x1, then increase heparin to 1900 units/hr - 6hr heparin level  Zan Triska, Tsz-Yin 09/28/2015,4:38 PM

## 2015-09-28 NOTE — Progress Notes (Signed)
CRITICAL VALUE ALERT  Critical value received:  Troponin 0.1  Date of notification:  09/28/15  Time of notification:  0957  Critical value read back:Yes.    Nurse who received alert:  Courtney HeysLauren Mueller, RN  MD notified (1st page):  Family Medicine  Time of first page:  [phone]  MD notified (2nd page):  Time of second page:  Responding MD:  Family Medicine  Time MD responded:  1000

## 2015-09-28 NOTE — H&P (Signed)
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 He's had nonspecific pain behind his right calf for 1 month and then a few weeks ago started with He had subjective fevers and cough and shortness of breath . He was treated with outpatient Levaquin for chest x-ray that showed left upper lobe infiltrates which only helped him a little bit and then he started feeling worse again with significant malaise and fatigue that resulted in him lying in bed for a week. He then began to develop dyspnea with exertion, orthopnea and paroxysmal nocturnal dyspnea associated with dry cough but without any hemoptysis. Along with this he started noticing bilateral pedal edema that is worse than baseline by significant amount especially right greater than left [he has a history of Surgery in the remote past in the right side] also noticed some facial puffiness and abdominal bloating. Since admission is requiring 2 L nasal cannula. His admission white count was 20,000. Mild transaminitis with hyperbilirubinemia and INR 1.9. His exam for bilateral pitting pedal edema right greater than left associated with some right-sided varicosities without any warmth. Primary service did a CT angiogram chest and that clearly shows bilateral pleural effusions associated with left upper lobe infiltrates but given the significant degree of left upper lobe inflammation it is unclear whether there pulmonary embolism  or not and radiologist raised the possibility of extrinsic compression from the left upper lobe infiltrates versus hilar mass. Pulmonary has been consulted. Other labs also show that he is hyponatremic with a sodium of 128  He does not have prolonged travel history other than a trip to [place] by car and back; went to the onset of this illness  PAST MEDICAL HISTORY :  He  has a past medical history of Diverticulosis.  PAST SURGICAL HISTORY: He  has past surgical history that includes Knee surgery.  Allergies  Allergen Reactions  . Bee Venom Anaphylaxis    No current facility-administered medications on file prior to encounter.   Current Outpatient Prescriptions on File Prior to Encounter  Medication Sig  . glipiZIDE (GLUCOTROL) 5 MG tablet Take 1 tablet (5 mg total) by mouth 2 (two) times daily before a meal.  . ibuprofen (ADVIL,MOTRIN) 200 MG tablet Take 600 mg by mouth every 6 (six) hours as needed for moderate pain. Reported on 09/27/2015    FAMILY HISTORY:  His has no family status information on file.   SOCIAL HISTORY: He  reports that he has been smoking.  He does not have any smokeless tobacco history on file. He reports that he drinks alcohol. He reports that he does not use illicit drugs.  REVIEW OF SYSTEMS:   This is according to the history of present illness. Otherwise 11 point review of systems is negative   VITAL SIGNS: BP 158/93 mmHg  Pulse 107  Temp(Src) 98.2 F (36.8 C) (Oral)  Resp 24  Ht 6' (1.829 m)  Wt 111.812 kg (246 lb 8 oz)  BMI 33.42  kg/m2  SpO2 97%  HEMODYNAMICS:    VENTILATOR SETTINGS:    INTAKE / OUTPUT: I/O last 3 completed shifts: In: 850 [P.O.:600; IV Piggyback:250] Out: 1450 [Urine:1450]  PHYSICAL EXAMINATION: General:  Obese male in no distress Neuro:  Alert and oriented 3. Speech normal. Moves all 4 extremity is normally HEENT:  Supple neck. According to the patient he has subjective facial puffiness. Possible elevation in  JVP no neck nodes Cardiovascular:  Regular rate and rhythm no murmurs Lungs:  Clear to auscultation bilaterally but overall diminished due to some basal crackles present Abdomen:  Obese soft nontender no obvious ascites Musculoskeletal:  Right calf with a scar from previous surgery. Right-sided varicosities present. Bilateral 2+ to 3+ pitting pedal edema present with slight worsening on the right side. No tenderness no warmth Skin:  Appears intact on the exposed areas  LABS PULMONARY  Recent Labs Lab 09/27/15 1815  TCO2 23    CBC  Recent Labs Lab 09/27/15 1548 09/27/15 1815 09/27/15 2026 09/28/15 0219  HGB 16.9 18.4* 16.0 15.9  HCT 49.7 54.0* 47.8 46.8  WBC 19.8*  --  20.0* 19.7*  PLT 210  --  209 204    COAGULATION  Recent Labs Lab 09/28/15 0219  INR 1.93*    CARDIAC   Recent Labs Lab 09/27/15 2026 09/28/15 0219  TROPONINI 0.07* 0.06*   No results for input(s): PROBNP in the last 168 hours.   CHEMISTRY  Recent Labs Lab 09/27/15 1548 09/27/15 1815 09/27/15 2026 09/28/15 0219  NA 128* 128*  --  128*  K 4.6 4.7  --  4.3  CL 93* 93*  --  98*  CO2 27  --   --  23  GLUCOSE 256* 246*  --  174*  BUN 13 14  --  13  CREATININE 0.81 0.50* 0.79 0.79  CALCIUM 8.6*  --   --  8.2*   Estimated Creatinine Clearance: 130 mL/min (by C-G formula based on Cr of 0.79).   LIVER  Recent Labs Lab 09/27/15 1713 09/28/15 0219  AST 135* 104*  ALT 525* 424*  ALKPHOS 193* 149*  BILITOT 1.9* 1.5*  PROT 6.3* 5.4*  ALBUMIN 2.9* 2.6*  INR  --  1.93*     INFECTIOUS No results for input(s): LATICACIDVEN, PROCALCITON in the last 168 hours.   ENDOCRINE CBG (last 3)   Recent Labs  09/27/15 2229 09/28/15 0613  GLUCAP 304* 196*         IMAGING x48h  - image(s) personally visualized  -   highlighted in bold Dg Chest 2 View  09/27/2015  CLINICAL DATA:  Cough and shortness of breath, progressing EXAM: CHEST  2 VIEW COMPARISON:  September 19, 2015 FINDINGS:  There is now airspace consolidation throughout the posterior segment of the left upper lobe. There is slight atelectasis in the left base. The right lung is clear. Heart size and pulmonary vascularity are within normal limits. No adenopathy. There is a benign exostosis arising from the inferior anterior right first rib. There is degenerative change in the thoracic spine. IMPRESSION: Airspace consolidation throughout the posterior segment of the left upper lobe consistent with pneumonia. Mild left base atelectasis. Right lung clear. Stable cardiac silhouette. These results will be called to the ordering clinician or representative by the Radiologist Assistant, and communication documented in the PACS or zVision Dashboard. Electronically Signed   By: Bretta BangWilliam  Woodruff III M.D.   On: 09/27/2015 14:35   Ct Angio Chest Pe W Or Wo Contrast  09/28/2015  CLINICAL DATA:  60 year old male with shortness of breath. EXAM: CT ANGIOGRAPHY CHEST WITH CONTRAST TECHNIQUE: Multidetector CT imaging of the chest was performed using the standard protocol during bolus administration of intravenous contrast. Multiplanar CT image reconstructions and MIPs were obtained to evaluate the vascular anatomy. CONTRAST:  100 cc Isovue 370 COMPARISON:  Chest radiograph dated 09/27/2015 FINDINGS: Evaluation of this exam is limited due to respiratory motion artifact. There is an area of ground-glass on nodular airspace opacity involving the posterior apical segment of the left upper lobe most compatible with pneumonia. Right apical nodular and ground-glass density is also noted to a lesser degree. There is mild diffuse interstitial prominence which may represent mild edema. There is moderate left and small right pleural effusions. No pneumothorax. The central airways are patent. There is minimal atherosclerotic calcification of the thoracic aorta. There is apparent peripheral filling defect in the left pulmonary artery at the bifurcation to the  upper and lower lobe (series 5 image 118 - 131). This may represent pulmonary artery embolus or mass effect and compression or invasion by a centrally located left hilar mass. An ill-defined 2.0 x 1.5 cm density in the left hilar region (series 5, image 107) may represent combination of adenopathy and consolidated lung or a centrally located/partially obstructing mass. Follow-up with CT is recommended to ensure resolution of the left lung opacity and exclude an underlying hilar mass. No other central pulmonary artery embolus identified. Evaluation of the distal branches of the pulmonary arteries is limited due to respiratory motion artifact and suboptimal visualization. There is no cardiomegaly or pericardial effusion. Left hilar adenopathy. The esophagus is grossly unremarkable with there is a small left thyroid hypodense nodule. Ultrasound may provide better evaluation. There is no axillary adenopathy. There is diffuse subcutaneous soft tissue stranding and edema the chest wall no fluid collection. There is degenerative changes of the spine. No acute fracture. There is retrograde flow of contrast from the right atrium into the IVC suggestive of a degree of right cardiac dysfunction. A 1.9 cm left renal upper pole hypodense lesion is not well characterized. This is stable compared to the CT dated 11/18/2013 and likely represents a cyst. Review of the MIP images confirms the above findings. IMPRESSION: Left upper lobe pneumonia. An ill-defined centrally located left hilar density may represent combination of adenopathy and consolidated portion of the lung versus a hilar mass. Follow-up is recommended to document resolution of the airspace opacity and exclude underlying hilar mass. Apparent peripheral filling defect in the left pulmonary artery at the bifurcation of the left upper and left lower lobe branches likely represent extrinsic compression on the vessel by the hilar adenopathy/ mass or invasion of a left  hilar lesion into the adjacent pulmonary artery. Acute pulmonary embolus is less likely, given peripheral orientation, but not entirely excluded. Interstitial edema and bilateral pleural effusions, left greater right. These results were called by telephone at the time of interpretation on 09/28/2015 at 12:52 am to Dr. Wende Mott, who verbally acknowledged these results. Electronically Signed   By: Elgie Collard M.D.   On: 09/28/2015 00:56        ASSESSMENT / PLAN:  PULMONARY A: Mild acute hypoxemic respiratory failure in a smoker.  - His history of classic pneumonia symptoms followed by pedal edema and facial puffiness and abdominal bloating and orthopnea and paroxysmal nocturnal dyspnea. His exam shows bilateral pedal edema without any warmth. Right-sided edema is worse but this could be due to his varicosities which appear  more prominent on the right. His BNP appears significantly elevated My #1 suspicion that he has acute congestive heart failure [systolic more likely than diastolic). His elevated liver function tests can be explained by hepatic congestion because of the heart failure process.   - Pulmonary embolism is possible especially given the fact he was having calf pain prior to the onset of pneumonia symptoms. This could've also been early heart failure resulting in pedal edema. The lack of warmth and the fact that his anasarca symptoms and the fact pulmonary embolus was not totally appreciable in the CT scan makes it less likely. In any event his INR is 1.9 and he appears as auto anticoagulated at this point  P:   Main goal is now to try to differentiate between the diagnosis. Hopefully testing can help definitively  Lasix empiric and Get cardiac echo  Check urine Legionella and streptococcus and progressive tone and  Get duplex lower extremities and d-dimer; if d-dimer normal which is unlikely. Could be helpful against PE  Regarding his infiltrates he would need follow-up with a CT  chest in several months  Pulmonary critical care will continue to follow  Discussed with residents and the patient at the bedside      Dr. Kalman ShanMurali Verlena Marlette, M.D., [facility].C.C.P Pulmonary and Critical Care Medicine Staff Physician [facility] [facility] Pager: [phone], If no answer or between  15:00h - 7:00h: call [phone]  09/28/2015 8:35 AM

## 2015-09-28 NOTE — Consult Note (Addendum)
CARDIOLOGY CONSULT NOTE   Patient ID: Robert Hampton MRN: [id number], DOB/AGE: [birth year]   Admit date: 09/27/2015 Date of Consult: 09/28/2015   Primary Physician: No PCP Per Patient Primary Cardiologist: new   Pt. Profile  Robert Hampton is a 60 yo male with PMH of DM II and HTN who presented with 3 weeks onset of increasing SOB. He was diagnosed with LUL PNA, possible mass in the lung, acute HF, elevated transaminase, R leg DVT and Echo shows new LV dysfunction. WBC close to 20.    Problem List  Past Medical History  Diagnosis Date  . Diverticulosis     shown on CT    Past Surgical History  Procedure Laterality Date  . Knee surgery       Allergies  Allergies  Allergen Reactions  . Bee Venom Anaphylaxis    HPI   Robert Hampton is a 60 yo male with PMH of DM II and HTN who presented with 3 weeks onset of increasing SOB. He does not have a PCP and has not seen a provider for years. He works in the [employer] and drinks several glasses of wine on a daily basis. Due to the increasing shortness of breath, he actually quit smoking 3 weeks ago. He denies ever having any exertional chest pain prior to 3 weeks ago. In the last 3 weeks, however he has been having a nonproductive cough that will not go away. He was treated with antibiotic 2 weeks ago. He says immediately after he took the first dose of antibiotic, he actually felt better, however his symptom came back worsening. At the same time he also noticed abdominal distention and lower extremity edema as well. He has chronic right leg pain for the past 20 years. There is a scar on his right leg which he says is the result of trauma suffered while he was working for [employer] and had his leg stuck under a conveyor belt. Otherwise prior to this admission he did not have any cardiac history. According to the patient's wife, he has been under a tremendous amount of stress when his father died of liver cirrhosis in 12/17/2016and he is the  sole caretaker of his mother who has been giving him a lot of stress.   Shortness of breath eventually worsened to the degree that he decided to seek medical attention at [facility]. He complained today admitting team that he was having increasing swelling in the right lower extremity with chronic pain, lower extremity venous Doppler shows positive for DVT. As far as his shortness of breath, CTA of the chest was obtained which did not show obvious PE however did show left upper lobe pneumonia, an ill-defined centrally located left hilar density which may represent adenopathy and  consolidated portion of the lung versus a hilar mass. There is apparent perihilar filling defect in the left pulmonary artery at bifurcation of the left upper lobe and left lower lobe branches likely represent extrinsic compression on the vessel by the hilar adenopathy versus mass there is also interstitial edema and bilateral pleural effusion with left greater than right. Echocardiogram was obtained on 09/28/2015 which showed EF 30-35%, diffuse hypokinesis, moderate TR, MR, PA peak pressure 47 mmHg. His serial troponin was mildly elevated. Sodium 128. He also has elevated transaminase with ALT 425, AST 135. Albumin 2.9. His white blood cell count was elevated at 19.8. BNP was 1788.2. His glucose level was over 300 and urinalysis shows greater than 1000 glucose. EKG  showed sinus tachycardia without obvious ST and T-wave changes.   Inpatient Medications  . aspirin EC  81 mg Oral Daily  . folic acid  1 mg Oral Daily  . furosemide  20 mg Intravenous BID  . insulin aspart  0-15 Units Subcutaneous TID WC  . insulin aspart  0-5 Units Subcutaneous QHS  . multivitamin with minerals  1 tablet Oral Daily  . piperacillin-tazobactam (ZOSYN)  IV  3.375 g Intravenous Q8H  . sodium chloride flush  3 mL Intravenous Q12H  . sodium chloride flush  3 mL Intravenous Q12H  . thiamine  100 mg Oral Daily   Or  . thiamine  100 mg  Intravenous Daily  . vancomycin  1,000 mg Intravenous Q12H    Family History No family history on file.   Social History Social History   Social History  . Marital Status: Married    Spouse Name: N/A  . Number of Children: N/A  . Years of Education: N/A   Occupational History  . Not on file.   Social History Main Topics  . Smoking status: Current Every Day Smoker  . Smokeless tobacco: Not on file  . Alcohol Use: Yes     Comment: daily   . Drug Use: No  . Sexual Activity: Not on file   Other Topics Concern  . Not on file   Social History Narrative     Review of Systems  General:  No chills, fever, night sweats or weight changes.  Cardiovascular:  No orthopnea, palpitations, paroxysmal nocturnal dyspnea. +chest pain during cough, dyspnea on exertion, edema Dermatological: No rash, lesions/masses Respiratory: +nonproductive cough, dyspnea Urologic: No hematuria, dysuria Abdominal:   No nausea, vomiting, diarrhea, bright red blood per rectum, melena, or hematemesis Neurologic:  No visual changes, wkns, changes in mental status. All other systems reviewed and are otherwise negative except as noted above.  Physical Exam  Blood pressure 148/95, pulse 106, temperature 98.6 F (37 C), temperature source Oral, resp. rate 20, height 6' (1.829 m), weight 246 lb 8 oz (111.812 kg), SpO2 98 %.  General: Pleasant, NAD Psych: Normal affect. Neuro: Alert and oriented X 3. Moves all extremities spontaneously. HEENT: Normal  Neck: Supple without bruits or JVD. Lungs:  Resp regular and unlabored. Bibasilar rale, diminished breath sound in LUL Heart: tachycardic. no s3, s4, or murmurs. Abdomen: Soft, non-tender, non-distended, BS + x 4.  Extremities: No clubbing, cyanosis. DP/PT/Radials 2+ and equal bilaterally. 1+ pitting edema, noticeable scar in RLE  Labs   Recent Labs  09/27/15 2026 09/28/15 0219 09/28/15 0745 09/28/15 1401  TROPONINI 0.07* 0.06* 0.10* 0.07*   Lab  Results  Component Value Date   WBC 19.7* 09/28/2015   HGB 15.9 09/28/2015   HCT 46.8 09/28/2015   MCV 93.2 09/28/2015   PLT 204 09/28/2015    Recent Labs Lab 09/28/15 0219  NA 128*  K 4.3  CL 98*  CO2 23  BUN 13  CREATININE 0.79  CALCIUM 8.2*  PROT 5.4*  BILITOT 1.5*  ALKPHOS 149*  ALT 424*  AST 104*  GLUCOSE 174*   No results found for: CHOL, HDL, LDLCALC, TRIG Lab Results  Component Value Date   DDIMER 5.73* 09/28/2015    Radiology/Studies  Dg Chest 2 View  09/27/2015  CLINICAL DATA:  Cough and shortness of breath, progressing EXAM: CHEST  2 VIEW COMPARISON:  September 19, 2015 FINDINGS: There is now airspace consolidation throughout the posterior segment of the left upper lobe. There is slight  atelectasis in the left base. The right lung is clear. Heart size and pulmonary vascularity are within normal limits. No adenopathy. There is a benign exostosis arising from the inferior anterior right first rib. There is degenerative change in the thoracic spine. IMPRESSION: Airspace consolidation throughout the posterior segment of the left upper lobe consistent with pneumonia. Mild left base atelectasis. Right lung clear. Stable cardiac silhouette. These results will be called to the ordering clinician or representative by the Radiologist Assistant, and communication documented in the PACS or zVision Dashboard. Electronically Signed   By: Bretta BangWilliam  Woodruff III M.D.   On: 09/27/2015 14:35   Dg Chest 2 View  09/19/2015  CLINICAL DATA:  Two days of dyspnea, shortness of breath, and fatigue EXAM: CHEST  2 VIEW COMPARISON:  None in PACs FINDINGS: The lungs are adequately inflated. The interstitial markings are increased bilaterally. Coarse lung markings in the infrahilar regions bilaterally are noted as well. The heart is top-normal in size. The pulmonary vascularity is normal. There is multilevel degenerative disc disease of the thoracic spine with calcification of portions of the anterior  longitudinal ligament. There is calcification associated with the first costosternal junction. IMPRESSION: Probable chronic bronchitis with superimposed interstitial pneumonia. There is no alveolar infiltrate nor CHF. Followup PA and lateral chest X-ray is recommended in 3-4 weeks following trial of antibiotic therapy to ensure resolution and exclude underlying malignancy. Electronically Signed   By: David  SwazilandJordan M.D.   On: 09/19/2015 16:19   Ct Angio Chest Pe W Or Wo Contrast  09/28/2015  CLINICAL DATA:  60 year old male with shortness of breath. EXAM: CT ANGIOGRAPHY CHEST WITH CONTRAST TECHNIQUE: Multidetector CT imaging of the chest was performed using the standard protocol during bolus administration of intravenous contrast. Multiplanar CT image reconstructions and MIPs were obtained to evaluate the vascular anatomy. CONTRAST:  100 cc Isovue 370 COMPARISON:  Chest radiograph dated 09/27/2015 FINDINGS: Evaluation of this exam is limited due to respiratory motion artifact. There is an area of ground-glass on nodular airspace opacity involving the posterior apical segment of the left upper lobe most compatible with pneumonia. Right apical nodular and ground-glass density is also noted to a lesser degree. There is mild diffuse interstitial prominence which may represent mild edema. There is moderate left and small right pleural effusions. No pneumothorax. The central airways are patent. There is minimal atherosclerotic calcification of the thoracic aorta. There is apparent peripheral filling defect in the left pulmonary artery at the bifurcation to the upper and lower lobe (series 5 image 118 - 131). This may represent pulmonary artery embolus or mass effect and compression or invasion by a centrally located left hilar mass. An ill-defined 2.0 x 1.5 cm density in the left hilar region (series 5, image 107) may represent combination of adenopathy and consolidated lung or a centrally located/partially obstructing  mass. Follow-up with CT is recommended to ensure resolution of the left lung opacity and exclude an underlying hilar mass. No other central pulmonary artery embolus identified. Evaluation of the distal branches of the pulmonary arteries is limited due to respiratory motion artifact and suboptimal visualization. There is no cardiomegaly or pericardial effusion. Left hilar adenopathy. The esophagus is grossly unremarkable with there is a small left thyroid hypodense nodule. Ultrasound may provide better evaluation. There is no axillary adenopathy. There is diffuse subcutaneous soft tissue stranding and edema the chest wall no fluid collection. There is degenerative changes of the spine. No acute fracture. There is retrograde flow of contrast from the right  atrium into the IVC suggestive of a degree of right cardiac dysfunction. A 1.9 cm left renal upper pole hypodense lesion is not well characterized. This is stable compared to the CT dated 11/18/2013 and likely represents a cyst. Review of the MIP images confirms the above findings. IMPRESSION: Left upper lobe pneumonia. An ill-defined centrally located left hilar density may represent combination of adenopathy and consolidated portion of the lung versus a hilar mass. Follow-up is recommended to document resolution of the airspace opacity and exclude underlying hilar mass. Apparent peripheral filling defect in the left pulmonary artery at the bifurcation of the left upper and left lower lobe branches likely represent extrinsic compression on the vessel by the hilar adenopathy/ mass or invasion of a left hilar lesion into the adjacent pulmonary artery. Acute pulmonary embolus is less likely, given peripheral orientation, but not entirely excluded. Interstitial edema and bilateral pleural effusions, left greater right. These results were called by telephone at the time of interpretation on 09/28/2015 at 12:52 am to Dr. Wende Mott, who verbally acknowledged these results.  Electronically Signed   By: Elgie Collard M.D.   On: 09/28/2015 00:56   US Abdomen Complete  09/28/2015  CLINICAL DATA:  Transaminitis EXAM: ABDOMEN ULTRASOUND COMPLETE COMPARISON:  CT scan November 18, 2013 FINDINGS: Gallbladder: The gallbladder wall is mildly thickened measuring 5 mm. There is slight pericholecystic fluid. No stones, sludge, or Murphy's sign. Common bile duct: Diameter: 3 mm Liver: No focal lesion identified. Within normal limits in parenchymal echogenicity. IVC: No abnormality visualized. Pancreas: The pancreas was not visualized. Spleen: Size and appearance within normal limits. Right Kidney: Length: 13.4 cm. Known renal cysts. The largest measures 2.3 cm, not changed since August 2015. Left Kidney: Length: 12.9 cm.  Known renal cysts. Abdominal aorta: No aneurysm visualized. Other findings: None. IMPRESSION: 1. Mild gallbladder wall thickening and a tiny amount of pericholecystic fluid identified. No stones, sludge, or Murphy's sign. If there is concern for acute cholecystitis, a HIDA scan could better evaluate. 2. Bilateral renal cysts. Electronically Signed   By: Gerome Sam III M.D   On: 09/28/2015 18:02    ECG  Sinus tach without significant ST-T wave changes  ASSESSMENT AND PLAN  1. Acute systolic HF with newly diagnosed LV dysfunction  - unclear cause, differential diagnosis include underlying CAD vs stress induced cardiomyopathy vs alcohol induced (although abdominal U/S shows no cirrhosis)  - he denies any exertional symptom prior to 3 weeks ago, since then, he only has CP with coughing which does not sound cardiac in nature.   - consider increase IV lasix to 40mg  BID. Once diuresed, will need ischemic workup either with cath or stress test  - repeat outpatient echo in 3 month. Add lisinopril 2.5mg  today. Will add spironolactone and coreg prior to discharge.    - elevated trop likely demand ischemia given no obvious trend.   2. ?mass in the lung: per PCCM, plan  for followup CT in several month  3. DVT: hold off on initiating oral anticoagulation until after ischemic workup. Continue IV heparin  4. LUL PNA: on abx  5. Elevated transaminase: likely hepatic congestion, abdominal U/S shows no cirrhosis  6. DM: uncontrolled, >1000 glucose in urine  7. Sinus tach: related to #1, 2 ,3 ,4, multiple issues diagnosed during this admission all of which can contribute to tachycardia   Signed, Azalee Course, PA-C 09/28/2015, 6:12 PM   Patient seen and examined. Agree with assessment and plan. Mr. 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  He admits to several week history of increasing shortness of breath, leg swelling, right lower extremity discomfort, and presents with acute systolic heart failure.  He has a right lower extremity DVT and possible mass in his lung for which follow-up CT scan is planned.  He is on heparin anticoagulation for his DVT.  His BNP is significantly elevated at [facility]1788 consistent with acute heart failure and I suspect his liver enzyme elevation is predominantly due to passive congestion from CHF.  He is diabetic with glucose over 300 and greater than 1000 mg of urinary glucose.  He has sinus tachycardia undoubtedly contributed by his CHF and volume overload.  At present, would initiate more aggressive IV diuresis with increased furosemide dose and initiation of ACE inhibitor patient.  We will also start the patient on initial low-dose carvedilol with plans for upward titration.  The patient would also benefit from spironolactone for aldosterone blockade.  His elevated troponin is most likely due to demand ischemia rather than acute coronary syndrome.  2-D echo Doppler study today reveals an EF of 30-35% with diffuse hypokinesis.  He was mild to moderate pulmonary hypertension with a PA pressure at 47 mm he had evidence for moderate tricuspid regurgitation.  His RV was poorly visualized.   Abdominal ultrasound shows mild gallbladder thickening without gallstones.  No focal hepatic lesions were identified.  We will follow the patient with you.   Lennette Biharihomas A. Dian Laprade, MD, [facility] 09/28/2015 6:46 PM

## 2015-09-28 NOTE — Progress Notes (Signed)
   09/27/15 2037  Vitals  Temp 97.6 F (36.4 C)  Temp Source Oral  BP (!) 164/101 mmHg (Nurse Notified)  BP Location Right Arm  BP Method Automatic  Patient Position (if appropriate) Lying  Pulse Rate (!) 113  Pulse Rate Source Dinamap  Resp (!) 24  Oxygen Therapy  SpO2 95 %  O2 Device Room Air  Height and Weight  Height 6' (1.829 m)  Weight 112.855 kg (248 lb 12.8 oz) (Scale A)  Type of Scale Used Standing  BSA (Calculated - sq m) 2.39 sq meters  BMI (Calculated) 33.8  Weight in (lb) to have BMI = 25 183.9  Admitted pt to rm 3E02 from ED, pt alert and oriented, denied pain at this time, oriented to room, call bell placed within reach, orders carried out.

## 2015-09-28 NOTE — Progress Notes (Signed)
ANTICOAGULATION CONSULT NOTE - Initial Consult  Pharmacy Consult for heparin Indication: DVT  Allergies  Allergen Reactions  . Bee Venom Anaphylaxis    Patient Measurements: Height: 6' (182.9 cm) Weight: 246 lb 8 oz (111.812 kg) (Scale A) IBW/kg (Calculated) : 77.6 Heparin Dosing Weight: 101 kg  Vital Signs: Temp: 98.2 F (36.8 C) (06/29 0631) Temp Source: Oral (06/29 0631) BP: 158/93 mmHg (06/29 0631) Pulse Rate: 107 (06/29 0631)  Labs:  Recent Labs  09/27/15 1548 09/27/15 1815 09/27/15 2026 09/28/15 0219  HGB 16.9 18.4* 16.0 15.9  HCT 49.7 54.0* 47.8 46.8  PLT 210  --  209 204  LABPROT  --   --   --  22.0*  INR  --   --   --  1.93*  CREATININE 0.81 0.50* 0.79 0.79  TROPONINI  --   --  0.07* 0.06*    Estimated Creatinine Clearance: 130 mL/min (by C-G formula based on Cr of 0.79).   Medical History: Past Medical History  Diagnosis Date  . Diverticulosis     shown on CT    Medications:  Prescriptions prior to admission  Medication Sig Dispense Refill Last Dose  . aspirin EC 81 MG tablet Take 81 mg by mouth daily.   09/26/2015 at pm  . glipiZIDE (GLUCOTROL) 5 MG tablet Take 1 tablet (5 mg total) by mouth 2 (two) times daily before a meal. 60 tablet 3 09/27/2015 at am  . ibuprofen (ADVIL,MOTRIN) 200 MG tablet Take 600 mg by mouth every 6 (six) hours as needed for moderate pain. Reported on 09/27/2015   Past Week at Unknown time  . OVER THE COUNTER MEDICATION Emergen-C packets: Mix and drink one packet by mouth daily   09/26/2015 at am   Scheduled:  . aspirin EC  81 mg Oral Daily  . furosemide  20 mg Intravenous BID  . heparin  4,000 Units Intravenous Once  . insulin aspart  0-15 Units Subcutaneous TID WC  . insulin aspart  0-5 Units Subcutaneous QHS  . piperacillin-tazobactam (ZOSYN)  IV  3.375 g Intravenous Q8H  . sodium chloride flush  3 mL Intravenous Q12H  . sodium chloride flush  3 mL Intravenous Q12H  . vancomycin  1,000 mg Intravenous Q12H     Assessment: 59 yoM with dopplers positive for DVT this morning. Pharmacy consulted to start heparin. Pharmacy also dosing abx for pneumonia.  Not previously on anticoagulation. Baseline H/H and platelets wnl.  Goal of Therapy:  Heparin level 0.3-0.7 units/ml Monitor platelets by anticoagulation protocol: Yes   Plan:  Give 4000 units bolus x 1 Start heparin infusion at 1700 units/hr Check anti-Xa level in 6 hours and daily while on heparin Continue to monitor H&H and platelets   Thank you for allowing us to participate in this patients care. Signe Coltonya C Birtha Hatler, PharmD Pager: [phone]  09/28/2015,9:27 AM

## 2015-09-28 NOTE — Progress Notes (Signed)
Preliminary results by tech - Venous Duplex Lower Ext. Completed. Positive for acute deep vein thrombosis involving the right femoral vein, popliteal vein, posterior tibial veins and peroneal veins. Left leg negative for deep and superficial vein thrombosis. Results given to patient's nurse, Lauren.  Marilynne Halstedita Whitnie Deleon, BS, RDMS, RVT

## 2015-09-28 NOTE — Progress Notes (Signed)
Family Medicine Teaching Service Daily Progress Note Intern Pager: [phone]  Patient name: Robert GeneraJonathan Hampton Medical record number: [id number] Date of birth: 31-Mar-1956 Age: 60 y.o. Gender: male  Primary Care Provider: No PCP Per Patient Consultants: Pulmonology Code Status: FULL  Pt Overview and Major Events to Date:  6/28: admit for SOB  Assessment and Plan: Robert Hampton is a 60 y.o. male presenting with shortness of breath and cough with LE swelling and jaundice, concern for PNA and also possible new CHF and also found to have RLE DVT. PMH is significant for T2DM, tobacco abuse.  RLE DVT:  - heparin per pharmacy  - will need to determine which oral anticoagulation patient will need for treatment   CAP: Concern new CHF vs pneumonia. Pt initially thought to have pneumonia, treated with Levaquin as outpatient. Leukocytosis stable this AM (19.7< 20). - Continue Vanc/Zosyn for now - Urinary Strep (negative) and Legionella pending - Blood cultures: NG < 24 hours - Cardiac monitoring and continuous pulse ox  Concern for new CHF with hepatic congestion:  BNP was 1788 in the ED. Also considering ACS. EKG with sinus tachycardia and left atrial enlargement. AM EKG unchanged. Troponins up-trending slightly. Continued tachycardia although slightly improved. Currently on 2L due to tachypnea. Urine output: 1.45L overnight with one dose of IV Lasix. Weight: 246lb < 248lb. Creatinine 0.79 from 0.5. UA with 100 protein.  - CTA chest: Shows LUL PNA and left hilar density- adenopathy vs consolidation vs hilar mass; peripheral filling defect in L pulm artery likely extrinsic compression by hilar adenopathy/mass or invasion of L hilar lesion into adjacent PA; shows some interstitial edema and bilateral pleural effusions L >R  - pulm consult: continue lasix and follow up on imaging and labs; will need follow up CT chest in several months  - troponin 0.07 > 0.06 > 0.1 (will continue to trend) - will  consult cards in the setting of uptrend of troponin - ECHO ordered  - Lasix 20mg  BID  Anasarca: Abdominal swelling with possible fluid shift. Scrotal swelling. Urine output: 1.45L overnight with one dose of IV Lasix. Weight: 246lb < 248lb. Creatinine 0.79 from 0.5. UA with 100 protein.  - continue Lasix 20mg  BID - Strict I/O - Daily weights - US Abdomen  Elevated Liver Enzymes / Bilirubin / Jaundice, improving: No abdominal pain. AST 135 >104, ALT 525 >424. Total bilirubin 1.9 > 1.5. Drinks 2-3 glasses of wine per day. Liver injury may be secondary to alcohol use vs hepatic congestion vs acute hepatitis.  - Hepatitis panel - TSH (nml), Ammonia (32), PT/INR (22/1.93) - US abdomen   Hyponatremia: Na 128. Likely secondary to fluid overloaded or beer Marland Kitchen(Hoyt Koch/wine) potomania. Anticipate this will improve with diuresis. - Lasix 20mg  IV bid  Type 2 DM: Diagnosed 1 week ago. CBG 174-304, AM 196 - A1c pending - Moderate SSI - CBGs with meals and at bedtime  Hypertension: BP still elevated but overall improved, 158/93 this AM. Likely worsened with continued cough and fluid overloaded state. - Hydralazine prn  Tobacco abuse: 3/4 PPD for 45 years - Declines Nicotine patch at this time.  Alcohol use: Drinks 2-3 glasses of wine per day. - CIWA (no scores overnight)  FEN/GI: Heart healthy diet, Miralax prn, SLIV Prophylaxis: Heparin drip  Disposition: pending management  Subjective:  Reports sob is slightly worse this AM. Reports better with elevation of bed. Did report of increased urine output with Lasix dose.   Objective: Temp:  [97.6 F (36.4 C)-98.9 F (37.2 C)]  98.2 F (36.8 C) (06/29 0631) Pulse Rate:  [107-117] 107 (06/29 0631) Resp:  [18-26] 24 (06/29 0631) BP: (128-172)/(79-111) 158/93 mmHg (06/29 0631) SpO2:  [91 %-98 %] 97 % (06/29 0631) Weight:  [111.812 kg (246 lb 8 oz)-113.399 kg (250 lb)] 111.812 kg (246 lb 8 oz) (06/29 0631) Physical Exam: General: NAD, pleasant   HEENT: scleral icterus Neck: no JVD Cardiovascular: tachycardia, RR, no m/r/g Respiratory: tachypnea, on [place] 2 L, gets short of breath with speaking improves with elevation of head of bed. minimal crackles at bases (improved)  Abdomen: soft, NT, distended, possible fluid shift?  Extremities: lower extremity edema with R >L. Pitting edema up to knee bilaterally, no increased warmth; no calf tenderness this AM  Skin: jaundice  Laboratory:  Recent Labs Lab 09/27/15 1548 09/27/15 1815 09/27/15 2026 09/28/15 0219  WBC 19.8*  --  20.0* 19.7*  HGB 16.9 18.4* 16.0 15.9  HCT 49.7 54.0* 47.8 46.8  PLT 210  --  209 204    Recent Labs Lab 09/27/15 1548 09/27/15 1713 09/27/15 1815 09/27/15 2026 09/28/15 0219  NA 128*  --  128*  --  128*  K 4.6  --  4.7  --  4.3  CL 93*  --  93*  --  98*  CO2 27  --   --   --  23  BUN 13  --  14  --  13  CREATININE 0.81  --  0.50* 0.79 0.79  CALCIUM 8.6*  --   --   --  8.2*  PROT  --  6.3*  --   --  5.4*  BILITOT  --  1.9*  --   --  1.5*  ALKPHOS  --  193*  --   --  149*  ALT  --  525*  --   --  424*  AST  --  135*  --   --  104*  GLUCOSE 256*  --  246*  --  174*  Troponin: 0.07 > 0.06 BNP 1788  Imaging/Diagnostic Tests: CTA Chest: 6/28: FINDINGS: Evaluation of this exam is limited due to respiratory motion artifact.  There is an area of ground-glass on nodular airspace opacity involving the posterior apical segment of the left upper lobe most compatible with pneumonia. Right apical nodular and ground-glass density is also noted to a lesser degree. There is mild diffuse interstitial prominence which may represent mild edema. There is moderate left and small right pleural effusions. No pneumothorax. The central airways are patent.  There is minimal atherosclerotic calcification of the thoracic aorta.  There is apparent peripheral filling defect in the left pulmonary artery at the bifurcation to the upper and lower lobe (series  5 image 118 - 131). This may represent pulmonary artery embolus or mass effect and compression or invasion by a centrally located left hilar mass. An ill-defined 2.0 x 1.5 cm density in the left hilar region (series 5, image 107) may represent combination of adenopathy and consolidated lung or a centrally located/partially obstructing mass. Follow-up with CT is recommended to ensure resolution of the left lung opacity and exclude an underlying hilar mass. No other central pulmonary artery embolus identified. Evaluation of the distal branches of the pulmonary arteries is limited due to respiratory motion artifact and suboptimal visualization.  There is no cardiomegaly or pericardial effusion. Left hilar adenopathy. The esophagus is grossly unremarkable with there is a small left thyroid hypodense nodule. Ultrasound may provide better evaluation.  There is no axillary adenopathy.  There is diffuse subcutaneous soft tissue stranding and edema the chest wall no fluid collection. There is degenerative changes of the spine. No acute fracture.  There is retrograde flow of contrast from the right atrium into the IVC suggestive of a degree of right cardiac dysfunction. A 1.9 cm left renal upper pole hypodense lesion is not well characterized. This is stable compared to the CT dated 11/18/2013 and likely represents a cyst.  Review of the MIP images confirms the above findings.  IMPRESSION: Left upper lobe pneumonia. An ill-defined centrally located left hilar density may represent combination of adenopathy and consolidated portion of the lung versus a hilar mass. Follow-up is recommended to document resolution of the airspace opacity and exclude underlying hilar mass.  Apparent peripheral filling defect in the left pulmonary artery at the bifurcation of the left upper and left lower lobe branches likely represent extrinsic compression on the vessel by the hilar adenopathy/ mass or  invasion of a left hilar lesion into the adjacent pulmonary artery. Acute pulmonary embolus is less likely, given peripheral orientation, but not entirely excluded.  Interstitial edema and bilateral pleural effusions, left greater right.  Palma HolterKanishka G Gunadasa, MD 09/28/2015, 6:47 AM PGY-1, [facility] FPTS Intern pager: [phone], text pages welcome

## 2015-09-28 NOTE — Progress Notes (Signed)
  Echocardiogram 2D Echocardiogram has been performed with definity.  Robert Hampton Robert Hampton 09/28/2015, 1:51 PM

## 2015-09-29 DIAGNOSIS — F1010 Alcohol abuse, uncomplicated: Secondary | ICD-10-CM | POA: Diagnosis present

## 2015-09-29 DIAGNOSIS — I272 Pulmonary hypertension, unspecified: Secondary | ICD-10-CM

## 2015-09-29 DIAGNOSIS — I824Y1 Acute embolism and thrombosis of unspecified deep veins of right proximal lower extremity: Secondary | ICD-10-CM

## 2015-09-29 DIAGNOSIS — I82401 Acute embolism and thrombosis of unspecified deep veins of right lower extremity: Secondary | ICD-10-CM

## 2015-09-29 DIAGNOSIS — I429 Cardiomyopathy, unspecified: Secondary | ICD-10-CM

## 2015-09-29 DIAGNOSIS — I82409 Acute embolism and thrombosis of unspecified deep veins of unspecified lower extremity: Secondary | ICD-10-CM | POA: Diagnosis present

## 2015-09-29 DIAGNOSIS — Z87891 Personal history of nicotine dependence: Secondary | ICD-10-CM | POA: Diagnosis present

## 2015-09-29 DIAGNOSIS — J189 Pneumonia, unspecified organism: Secondary | ICD-10-CM

## 2015-09-29 DIAGNOSIS — I255 Ischemic cardiomyopathy: Secondary | ICD-10-CM

## 2015-09-29 LAB — HEPATITIS PANEL, ACUTE
HCV Ab: <0.1 s/co ratio (ref 0.0–0.9)
Hep A IgM: NEGATIVE
Hep B C IgM: NEGATIVE
Hepatitis B Surface Ag: NEGATIVE

## 2015-09-29 LAB — COMPREHENSIVE METABOLIC PANEL
ALBUMIN: 2.3 g/dL — AB (ref 3.5–5.0)
ALT: 284 U/L — ABNORMAL HIGH (ref 17–63)
ANION GAP: 8 (ref 5–15)
AST: 61 U/L — ABNORMAL HIGH (ref 15–41)
Alkaline Phosphatase: 123 U/L (ref 38–126)
BILIRUBIN TOTAL: 1.9 mg/dL — AB (ref 0.3–1.2)
BUN: 14 mg/dL (ref 6–20)
CHLORIDE: 94 mmol/L — AB (ref 101–111)
CO2: 30 mmol/L (ref 22–32)
Calcium: 8.4 mg/dL — ABNORMAL LOW (ref 8.9–10.3)
Creatinine, Ser: 0.86 mg/dL (ref 0.61–1.24)
GFR calc Af Amer: >60 mL/min (ref >60)
GFR calc non Af Amer: >60 mL/min (ref >60)
GLUCOSE: 160 mg/dL — AB (ref 65–99)
POTASSIUM: 4.2 mmol/L (ref 3.5–5.1)
Sodium: 132 mmol/L — ABNORMAL LOW (ref 135–145)
TOTAL PROTEIN: 5.3 g/dL — AB (ref 6.5–8.1)

## 2015-09-29 LAB — CBC
HEMATOCRIT: 46.5 % (ref 39.0–52.0)
Hemoglobin: 15.4 g/dL (ref 13.0–17.0)
MCH: 31.5 pg (ref 26.0–34.0)
MCHC: 33.1 g/dL (ref 30.0–36.0)
MCV: 95.1 fL (ref 78.0–100.0)
PLATELETS: 240 10*3/uL (ref 150–400)
RBC: 4.89 MIL/uL (ref 4.22–5.81)
RDW: 14.3 % (ref 11.5–15.5)
WBC: 18.3 10*3/uL — ABNORMAL HIGH (ref 4.0–10.5)

## 2015-09-29 LAB — GLUCOSE, CAPILLARY
GLUCOSE-CAPILLARY: 173 mg/dL — AB (ref 65–99)
GLUCOSE-CAPILLARY: 250 mg/dL — AB (ref 65–99)
Glucose-Capillary: 153 mg/dL — ABNORMAL HIGH (ref 65–99)
Glucose-Capillary: 249 mg/dL — ABNORMAL HIGH (ref 65–99)

## 2015-09-29 LAB — LEGIONELLA PNEUMOPHILA SEROGP 1 UR AG
L. PNEUMOPHILA SEROGP 1 UR AG: NEGATIVE
L. PNEUMOPHILA SEROGP 1 UR AG: NEGATIVE

## 2015-09-29 LAB — HEMOGLOBIN A1C
HEMOGLOBIN A1C: 10.4 % — AB (ref 4.8–5.6)
Mean Plasma Glucose: 252 mg/dL

## 2015-09-29 LAB — HEPARIN LEVEL (UNFRACTIONATED)
HEPARIN UNFRACTIONATED: 0.24 [IU]/mL — AB (ref 0.30–0.70)
HEPARIN UNFRACTIONATED: 0.41 [IU]/mL (ref 0.30–0.70)

## 2015-09-29 LAB — TROPONIN I: Troponin I: 0.08 ng/mL (ref <0.03)

## 2015-09-29 MED ORDER — LISINOPRIL 5 MG PO TABS
5.0000 mg | ORAL_TABLET | Freq: Every day | ORAL | Status: DC
  Administered 2015-09-30 – 2015-10-07 (×8): 5 mg via ORAL
  Filled 2015-09-29 (×8): qty 1

## 2015-09-29 MED ORDER — HEPARIN BOLUS VIA INFUSION
3000.0000 [IU] | Freq: Once | INTRAVENOUS | Status: AC
  Administered 2015-09-29: 3000 [IU] via INTRAVENOUS
  Filled 2015-09-29: qty 3000

## 2015-09-29 MED ORDER — SPIRONOLACTONE 25 MG PO TABS
12.5000 mg | ORAL_TABLET | Freq: Every day | ORAL | Status: DC
  Administered 2015-09-29 – 2015-10-07 (×9): 12.5 mg via ORAL
  Filled 2015-09-29 (×9): qty 1

## 2015-09-29 MED ORDER — LEVALBUTEROL HCL 0.63 MG/3ML IN NEBU
0.6300 mg | INHALATION_SOLUTION | Freq: Once | RESPIRATORY_TRACT | Status: AC
  Administered 2015-09-29: 0.63 mg via RESPIRATORY_TRACT
  Filled 2015-09-29: qty 3

## 2015-09-29 MED ORDER — INSULIN GLARGINE 100 UNIT/ML ~~LOC~~ SOLN
4.0000 [IU] | Freq: Every day | SUBCUTANEOUS | Status: DC
  Administered 2015-09-29: 4 [IU] via SUBCUTANEOUS
  Filled 2015-09-29 (×3): qty 0.04

## 2015-09-29 MED ORDER — FUROSEMIDE 10 MG/ML IJ SOLN
40.0000 mg | Freq: Three times a day (TID) | INTRAMUSCULAR | Status: DC
  Administered 2015-09-29 – 2015-10-05 (×20): 40 mg via INTRAVENOUS
  Filled 2015-09-29 (×22): qty 4

## 2015-09-29 MED ORDER — LIVING WELL WITH DIABETES BOOK
Freq: Once | Status: AC
  Administered 2015-09-29: 12:00:00
  Filled 2015-09-29: qty 1

## 2015-09-29 MED ORDER — INSULIN STARTER KIT- PEN NEEDLES (ENGLISH)
1.0000 | Freq: Once | Status: AC
  Administered 2015-09-29: 1
  Filled 2015-09-29: qty 1

## 2015-09-29 MED ORDER — LEVOFLOXACIN 750 MG PO TABS
750.0000 mg | ORAL_TABLET | Freq: Every day | ORAL | Status: AC
  Administered 2015-09-29 – 2015-10-01 (×3): 750 mg via ORAL
  Filled 2015-09-29 (×5): qty 1

## 2015-09-29 MED ORDER — HEPARIN BOLUS VIA INFUSION
1500.0000 [IU] | Freq: Once | INTRAVENOUS | Status: AC
  Administered 2015-09-29: 1500 [IU] via INTRAVENOUS
  Filled 2015-09-29: qty 1500

## 2015-09-29 NOTE — Progress Notes (Signed)
Subjective:  SOB improving  Objective:  Vital Signs in the last 24 hours: Temp:  [98.2 F (36.8 C)-99.3 F (37.4 C)] 98.2 F (36.8 C) (06/30 0443) Pulse Rate:  [97-114] 97 (06/30 0443) Resp:  [20] 20 (06/30 0443) BP: (126-148)/(72-95) 126/72 mmHg (06/30 0443) SpO2:  [92 %-98 %] 94 % (06/30 0920) Weight:  [242 lb 9.6 oz (110.043 kg)] 242 lb 9.6 oz (110.043 kg) (06/30 0443)  Intake/Output from previous day:  Intake/Output Summary (Last 24 hours) at 09/29/15 1106 Last data filed at 09/29/15 1049  Gross per 24 hour  Intake 1232.54 ml  Output   4150 ml  Net -2917.46 ml    Physical Exam: General appearance: alert, cooperative, no distress and moderately obese Neck: no carotid bruit and no JVD Lungs: decreased at both bases Heart: regular rate and rhythm Extremities: 1+ RLE edema Skin: pale, cool, dry Neurologic: Grossly normal   Rate: 96  Rhythm: normal sinus rhythm and premature ventricular contractions (PVC)  Lab Results:  Recent Labs  09/28/15 0219 09/29/15 0222  WBC 19.7* 18.3*  HGB 15.9 15.4  PLT 204 240    Recent Labs  09/28/15 0219 09/29/15 0222  NA 128* 132*  K 4.3 4.2  CL 98* 94*  CO2 23 30  GLUCOSE 174* 160*  BUN 13 14  CREATININE 0.79 0.86    Recent Labs  09/28/15 2021 09/29/15 0222  TROPONINI 0.12* 0.08*    Recent Labs  09/28/15 0219  INR 1.93*   BNP (last 3 results)  Recent Labs  09/27/15 1622  BNP 1788.2*    ProBNP (last 3 results) No results for input(s): PROBNP in the last 8760 hours.  Scheduled Meds: . aspirin EC  81 mg Oral Daily  . carvedilol  3.125 mg Oral BID WC  . folic acid  1 mg Oral Daily  . furosemide  40 mg Intravenous BID  . insulin aspart  0-15 Units Subcutaneous TID WC  . insulin aspart  0-5 Units Subcutaneous QHS  . insulin glargine  4 Units Subcutaneous Daily  . lisinopril  2.5 mg Oral Daily  . multivitamin with minerals  1 tablet Oral Daily  . piperacillin-tazobactam (ZOSYN)  IV  3.375 g  Intravenous Q8H  . sodium chloride flush  3 mL Intravenous Q12H  . sodium chloride flush  3 mL Intravenous Q12H  . thiamine  100 mg Oral Daily   Or  . thiamine  100 mg Intravenous Daily  . vancomycin  1,000 mg Intravenous Q12H   Continuous Infusions: . heparin 2,500 Units/hr (09/29/15 1037)   PRN Meds:.sodium chloride, benzonatate, hydrALAZINE, LORazepam **OR** LORazepam, ondansetron **OR** ondansetron (ZOFRAN) IV, polyethylene glycol, sodium chloride flush   Imaging: Dg Chest 2 View  09/27/2015  CLINICAL DATA:  Cough and shortness of breath, progressing EXAM: CHEST  2 VIEW COMPARISON:  September 19, 2015 FINDINGS: There is now airspace consolidation throughout the posterior segment of the left upper lobe. There is slight atelectasis in the left base. The right lung is clear. Heart size and pulmonary vascularity are within normal limits. No adenopathy. There is a benign exostosis arising from the inferior anterior right first rib. There is degenerative change in the thoracic spine. IMPRESSION: Airspace consolidation throughout the posterior segment of the left upper lobe consistent with pneumonia. Mild left base atelectasis. Right lung clear. Stable cardiac silhouette. These results will be called to the ordering clinician or representative by the Radiologist Assistant, and communication documented in the PACS or zVision Dashboard. Electronically Signed  By: Bretta BangWilliam  Woodruff III M.D.   On: 09/27/2015 14:35   Ct Angio Chest Pe W Or Wo Contrast  09/28/2015  CLINICAL DATA:  60 year old male with shortness of breath. EXAM: CT ANGIOGRAPHY CHEST WITH CONTRAST TECHNIQUE: Multidetector CT imaging of the chest was performed using the standard protocol during bolus administration of intravenous contrast. Multiplanar CT image reconstructions and MIPs were obtained to evaluate the vascular anatomy. CONTRAST:  100 cc Isovue 370 COMPARISON:  Chest radiograph dated 09/27/2015 FINDINGS: Evaluation of this exam is  limited due to respiratory motion artifact. There is an area of ground-glass on nodular airspace opacity involving the posterior apical segment of the left upper lobe most compatible with pneumonia. Right apical nodular and ground-glass density is also noted to a lesser degree. There is mild diffuse interstitial prominence which may represent mild edema. There is moderate left and small right pleural effusions. No pneumothorax. The central airways are patent. There is minimal atherosclerotic calcification of the thoracic aorta. There is apparent peripheral filling defect in the left pulmonary artery at the bifurcation to the upper and lower lobe (series 5 image 118 - 131). This may represent pulmonary artery embolus or mass effect and compression or invasion by a centrally located left hilar mass. An ill-defined 2.0 x 1.5 cm density in the left hilar region (series 5, image 107) may represent combination of adenopathy and consolidated lung or a centrally located/partially obstructing mass. Follow-up with CT is recommended to ensure resolution of the left lung opacity and exclude an underlying hilar mass. No other central pulmonary artery embolus identified. Evaluation of the distal branches of the pulmonary arteries is limited due to respiratory motion artifact and suboptimal visualization. There is no cardiomegaly or pericardial effusion. Left hilar adenopathy. The esophagus is grossly unremarkable with there is a small left thyroid hypodense nodule. Ultrasound may provide better evaluation. There is no axillary adenopathy. There is diffuse subcutaneous soft tissue stranding and edema the chest wall no fluid collection. There is degenerative changes of the spine. No acute fracture. There is retrograde flow of contrast from the right atrium into the IVC suggestive of a degree of right cardiac dysfunction. A 1.9 cm left renal upper pole hypodense lesion is not well characterized. This is stable compared to the CT  dated 11/18/2013 and likely represents a cyst. Review of the MIP images confirms the above findings. IMPRESSION: Left upper lobe pneumonia. An ill-defined centrally located left hilar density may represent combination of adenopathy and consolidated portion of the lung versus a hilar mass. Follow-up is recommended to document resolution of the airspace opacity and exclude underlying hilar mass. Apparent peripheral filling defect in the left pulmonary artery at the bifurcation of the left upper and left lower lobe branches likely represent extrinsic compression on the vessel by the hilar adenopathy/ mass or invasion of a left hilar lesion into the adjacent pulmonary artery. Acute pulmonary embolus is less likely, given peripheral orientation, but not entirely excluded. Interstitial edema and bilateral pleural effusions, left greater right. These results were called by telephone at the time of interpretation on 09/28/2015 at 12:52 am to Dr. Wende MottMcKeag, who verbally acknowledged these results. Electronically Signed   By: Elgie CollardArash  Radparvar M.D.   On: 09/28/2015 00:56   [place] Abdomen Complete  09/28/2015  CLINICAL DATA:  Transaminitis EXAM: ABDOMEN ULTRASOUND COMPLETE COMPARISON:  CT scan November 18, 2013 FINDINGS: Gallbladder: The gallbladder wall is mildly thickened measuring 5 mm. There is slight pericholecystic fluid. No stones, sludge, or Murphy's sign. Common  bile duct: Diameter: 3 mm Liver: No focal lesion identified. Within normal limits in parenchymal echogenicity. IVC: No abnormality visualized. Pancreas: The pancreas was not visualized. Spleen: Size and appearance within normal limits. Right Kidney: Length: 13.4 cm. Known renal cysts. The largest measures 2.3 cm, not changed since August 2015. Left Kidney: Length: 12.9 cm.  Known renal cysts. Abdominal aorta: No aneurysm visualized. Other findings: None. IMPRESSION: 1. Mild gallbladder wall thickening and a tiny amount of pericholecystic fluid identified. No stones,  sludge, or Murphy's sign. If there is concern for acute cholecystitis, a HIDA scan could better evaluate. 2. Bilateral renal cysts. Electronically Signed   By: Gerome Sam III M.D   On: 09/28/2015 18:02    Cardiac Studies: Echo 09/28/15 Impressions:  - LVEF 30-35%, mildly dilated with global hypokinesis, mild MR,  normal biatrial size, moderate TR, RVSP 47 mmHg, dilated IVC, no  pericardial effusion.  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 He admits to several week history of increasing shortness of breath and right lower extremity discomfort and swelling. He was admitted 09/27/15 with acute systolic heart failure. He has a right lower extremity DVT and possible mass vs adenopathy from his pneumonia in his lung for which follow-up CT scan is planned. He is on heparin anticoagulation for his DVT. His BNP was significantly elevated at [facility] consistent with acute heart failure. He is a diabetic with glucose over 300 and greater than 1000 mg of urinary glucose on admission. He has sinus tachycardia undoubtedly contributed by his CHF and volume overload. He has diuresed 8 lbs and 3.6L since admission. Echo shows an EF of 30-35% with global HK.   Principal Problem:   Acute respiratory failure with hypoxia (HCC) Active Problems:   Acute systolic CHF    CAP (community acquired pneumonia)-LUL   Uncontrolled type 2 DM- on oral agent    Anasarca   Cardiomyopathy- etiology not yet determined- appears to be NICM   DVT-Rt lower extremity (HCC)   Elevated liver enzymes   ETOH abuse-daily drinker   Smoker-quit "9 days ago"   PLAN: Continue IV diuretics. Rx of DVT, CAP, and lung mass per primary service. Etiology of his CM not clear yet-(? ETOH). I suspect he will need a Myoview once stable. MD to see.   Corine Shelter PA-C 09/29/2015, 11:06 AM [phone]   Patient seen and examined. Agree with assessment and plan.  Good diuresis with -3791 net urine output since admission. Breathing better; lungs without rales. LE edema persists but improved. Will titrate lisinopril to 5 mg today and tomorrow increase coreg to 6.25 mg bid.  Add spironolactone 12.5 mg initially and titrate to bid.  F/u bmet, bnp lft's in am. Will ultimately need lexiscan myoview for ischemic assessment.   Lennette Bihari, MD, [facility] 09/29/2015 12:46 PM

## 2015-09-29 NOTE — Care Management Note (Signed)
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 He stated that he stopped smoking 3 weeks ago and no alcohol in 2 weeks; CM asked pt if he wanted someone to talk to him about his alcohol consumption, he stated no; He goes to Urgent Care for medical treatment and is seen by Dr Merla Richesoolittle for 5 yrs and plans to continue to see him; No medical insurance at this time but he and his spouse are exploring their options for insurance. Pharmacy of choice is [facility]; He plans to purchase scales, CM talked to patient about the importance of weighing himself daily and a low sodium diet. Patient states he cooks a [employer] diet. Dietitian was in to talk to pt and his wife earlier today. CM will continue to follow for DCP.  Expected Discharge Date:     Possibly 10/02/2015             Expected Discharge Plan:  Home/Self Care  In-House Referral:   Financial Counselor, Dietition  Discharge planning Services  CM Consult  Post Acute Care Choice:  NA Choice offered to:     DME Arranged:    DME Agency:     HH Arranged:    HH Agency:     Status of Service:  In process, will continue to follow  If discussed at Long Length of Stay Meetings, dates discussed:    Additional Comments:  Cherrie DistanceChandler, Jacynda Brunke L, RN 09/29/2015, 11:21 AM

## 2015-09-29 NOTE — Progress Notes (Signed)
ANTICOAGULATION CONSULT NOTE - Follow Up Consult  Pharmacy Consult for heparin Indication: DVT  Allergies  Allergen Reactions  . Bee Venom Anaphylaxis    Patient Measurements: Height: 6' (182.9 cm) Weight: 242 lb 9.6 oz (110.043 kg) (scale a) IBW/kg (Calculated) : 77.6 Heparin Dosing Weight: 101 kg  Vital Signs: Temp: 98.2 F (36.8 C) (06/30 0443) Temp Source: Oral (06/30 0443) BP: 126/72 mmHg (06/30 0443) Pulse Rate: 97 (06/30 0443)  Labs:  Recent Labs  09/27/15 2026 09/28/15 0219  09/28/15 1401 09/28/15 1551 09/28/15 2021 09/28/15 2312 09/29/15 0222 09/29/15 0652  HGB 16.0 15.9  --   --   --   --   --  15.4  --   HCT 47.8 46.8  --   --   --   --   --  46.5  --   PLT 209 204  --   --   --   --   --  240  --   LABPROT  --  22.0*  --   --   --   --   --   --   --   INR  --  1.93*  --   --   --   --   --   --   --   HEPARINUNFRC  --   --   --   --  <0.10*  --  0.12*  --  0.24*  CREATININE 0.79 0.79  --   --   --   --   --  0.86  --   TROPONINI 0.07* 0.06*  < > 0.07*  --  0.12*  --  0.08*  --   < > = values in this interval not displayed.  Estimated Creatinine Clearance: 120 mL/min (by C-G formula based on Cr of 0.86).   Assessment: 60 yo male on heparin for DVT. Heparin level remains subtherapeutic on 2300 units/hr. No issues with line or bleeding reported per RN.  Goal of Therapy:  Heparin level 0.3-0.7 units/ml Monitor platelets by anticoagulation protocol: Yes   Plan:  - Give 1500 units bolus and increase heparin to 2500 units/hr - Check anti-Xa level in 6 hours and daily while on heparin - Continue to monitor H&H and platelets   Thank you for allowing us to participate in this patients care. Signe Coltonya C Adriene Padula, PharmD Pager: [phone]  09/29/2015,9:17 AM

## 2015-09-29 NOTE — Discharge Summary (Signed)
Family Medicine Teaching [facility] Discharge Summary  Patient name: Robert Hampton Medical record number: [id number] Date of birth: 10/12/1955 Age: 60 y.o. Gender: male Date of Admission: 09/27/2015  Date of Discharge: 10/07/2015 Admitting Physician: Tobey Grim, MD  Primary Care Provider: No PCP Per Patient Consultants: Pulmonology, cariodiology  Indication for Hospitalization: dyspnea and malaise  Discharge Diagnoses/Problem List:  Principal Problem:   Acute respiratory failure with hypoxia (HCC) Active Problems:   Uncontrolled type 2 DM- on oral agent    Acute systolic CHF    Anasarca   Elevated liver enzymes   CAP (community acquired pneumonia)-LUL   Cardiomyopathy- etiology not yet determined- appears to be NICM   ETOH abuse-daily drinker   Smoker-quit "9 days ago"   DVT-Rt lower extremity (HCC)   Pulmonary hypertension (HCC)   Abnormal CT scan of lung   Abnormal nuclear stress test   CHF (congestive heart failure) (HCC)   PNA (pneumonia)   Coronary artery disease due to lipid rich plaque   LV dysfunction   S/P drug eluting coronary stent placement  Disposition: Discharge home  Discharge Condition: Stable  Discharge Exam:  BP 125/81 mmHg  Pulse 80  Temp(Src) 98.1 F (36.7 C) (Oral)  Resp 26  Ht 6' (1.829 m)  Wt 214 lb 11.7 oz (97.4 kg)  BMI 29.12 kg/m2  SpO2 97% Gen: awake, alert, NAD, sitting up in bed with family at bedside HEENT: [place]/AT, EOMI, sclera white, MMM Cardio: RRR, no murmurs Pulm: CTAB, normal WOB on room air GI: soft, NT/ND, +BS Ext: WWP, no edema Skin: large well healed scar on left medial calf; no rashes Neuro: follows commands, no focal deficits Psych: mood stable, speech normal, AOx3  Brief Hospital Course:  Robert Hampton is a 60 y.o. male presenting with shortness of breath, cough, LE swelling, and jaundice. PMH is significant for T2DM, tobacco abuse  CAP: Patient reported of a few week history of cough and week  history of shortness of breath. He was seen in in urgent one week ago, was diagnosed with PNA and prescribed Levaquin. He had no improvement so he returned to PCP and was sent to the ED due to PNA that was thought be unresponsive to outpatient therapy. On presentation, he was started on Vancomycin and Zosyn. His CBC showed leukocytosis. CXR showed left upper lobe pneumonia. He was tachycardic and tachypneic; he also had R>L lower extremity swelling with calf tenderness with Wells score of 9. Due to concern for PE, CTA chest was obtained which could not entirely exclude PE; however he was found to have a DVT and was started on heparin drip (note below). It was thought that his worsening shortness of breath was more likely due to CHF rather than a pneumonia not responding to Levaquin, therefore, he was restarted on Levaquin. Blood cultures showed no growth. He required 2L oxygen for tachypnea but did not have desaturations. Urinary strep pneumonia and legionella were negative. Pulmonology was consulted due to a possible hilar mass on CTA chest imaging who recommended follow up CT imaging with contrast in 4-6 weeks to evaluate after resolution of PNA on CXR. Pulmonology would like to see patient as outpatient in 4-6 weeks.   Acute CHF with hepatic congestion, with new diagnosis of CHF:  Patient had LE swelling, scrotal swelling and bibasilar crackles on lung exam. His BNP was elevated to 1788. He also had transaminitis, elevated bilirubin, and elevated INR. His EKG showed sinus tachycardia and left atrial enlargement with repeat EKG with  similar findings. His troponins were trended and peaked at 0.12. Cardiology was consulted and thought elevated troponins were likely demand ischemia in the setting of acute CHF. Patient was started on ACE inhibitor, Coreg, and Spironolactone. He was diuresed with IV Lasix. ECHO showed decreased EF 30-35%, mildly dilated LV with global hypokinesis. Cardiology recommended repeat ECHO  in 3 months. He had an ischemic work up after diuresis as outlined below.   Of note, transaminitis was further evaluated with abdominal ultrasound which was unremarkable, hepatitis panel was negative. TSH and ammonia were within normal limits.   New CAD: Found on cath (7/5) after high risk myoview study after patient presented with new acute CHF. Was found to have severe 3 vessel CAD involving high grade stenoses of mid-RCA, mid-LAD, mid-circumflex and total occlusion of the second diagonal. Was taken for PCI with placement of Biofreedom drug elunting stents (Biofreedom trial) and will follow up with cardiology research nurses monitoring. Needs 1 month of DAPT (ASA81 and Plavix 75mg ).  RLE DVT: Lower extremity dopplers showed DVT (detailed result below). Patient was initially started on Heparin drip. Transitioned to Xarelto at 15mg  BID for 3 weeks.  He will need to transition to 20mg  dose.  Hyponatremia: Improved with diuresis. Likely has a component of beer/wine potomania. CIWA protocol was followed, and patient scored 0.   Type 2 DM: Hemoglobin A1c 10.4. Patient did not have a prior A1c in chart, but was started on Glipizide as outpatient. Patient was started on Lantus. Glipizide was held on admission. Continuing insulin given high A1c was considered at discharge.  However, patient uninsured and newly diagnosed 1 week prior to admission.  Discussed possibly starting Metformin in addition to glipizide.  Will defer this to PCP.  Patient discharged on Glipizide only w/ close follow up with PCP.  Hypertension:  Patient was started on Lisinopril, Coreg, and Spironolactone.  He tolerated these medications well.   Discharge instructions discussed and medications reviewed.  Patient was set up with MAP for medication coverage.  [facility]  He also had appointments scheduled with Cardiology and Pulmonology.  At the time of discharge, he  was eating, drinking, voiding normally.  He was discharged home in stable condition.    Issues for Follow Up:  - Will need repeat CT chest with contrast in 4-6 weeks to evaluate hilar mass, as there is concern for possible malignancy in the setting of acute DVT. - will need repeat ECHO in 3 months  - outpatient follow up with pulmonology and cardiology - Will need Xarelto Rx for 20mg  dose after 3 weeks on 15mg  BID for DVT treatment. - Please make sure patient has Spironolactone Rx (not sure this was among the printed rx's for MAP). - Consider adding Metformin to regimen  Significant Procedures: coronary DES placement  Significant Labs and Imaging:   Recent Labs Lab 10/05/15 2136 10/06/15 0353 10/07/15 0548  WBC 8.6 13.0* 14.0*  HGB 12.1* 16.5 16.5  HCT 36.2* 50.2 49.8  PLT 267 353 357    Recent Labs Lab 10/04/15 0339 10/05/15 0231 10/05/15 2136 10/06/15 0353 10/07/15 0548  NA 132* 133* 131* 134* 133*  K 3.8 3.9 3.7 3.9 4.2  CL 95* 97* 95* 96* 100*  CO2 28 28 27 28 25   GLUCOSE 268* 214* 236* 139* 199*  BUN 16 19 14 15 13   CREATININE 0.77 0.84 0.82 0.85 0.79  CALCIUM 8.6* 8.9 8.8* 8.8* 9.2  MG  --   --   --   --  1.8  ALKPHOS  --   --   --   --  88  AST  --   --   --   --  22  ALT  --   --   --   --  44  ALBUMIN  --   --   --   --  2.7*  BNP: 1788 Hepatitis Panel: Negative Troponin: 0.07 > 0.06 > 0.1 > 0.07 > 0.12 > 0.08 > 0.62 UA: sg 1.036, glucose > 1000, hgb small, Ketones 15, protein 100 Urine legionella: negative Urine Step: negative  TSH: 2.456 Ammonia: 32 INR 1.93; PT 22 Procalcitonin: 0.21 D-dimer 5.73  US Abdomen 6/29:  FINDINGS: Gallbladder: The gallbladder wall is mildly thickened measuring 5 mm. There is slight pericholecystic fluid. No stones, sludge, or Murphy's sign.  Common bile duct: Diameter: 3 mm  Liver: No focal lesion identified. Within normal limits in parenchymal echogenicity.  IVC: No abnormality visualized.  Pancreas:  The pancreas was not visualized.  Spleen: Size and appearance within normal limits.  Right Kidney: Length: 13.4 cm. Known renal cysts. The largest measures 2.3 cm, not changed since August 2015.  Left Kidney: Length: 12.9 cm. Known renal cysts.  Abdominal aorta: No aneurysm visualized.  Other findings: None.  IMPRESSION: 1. Mild gallbladder wall thickening and a tiny amount of pericholecystic fluid identified. No stones, sludge, or Murphy's sign. If there is concern for acute cholecystitis, a HIDA scan could better evaluate. 2. Bilateral renal cysts.  CTA Chest 6/28:  FINDINGS: Evaluation of this exam is limited due to respiratory motion artifact.  There is an area of ground-glass on nodular airspace opacity involving the posterior apical segment of the left upper lobe most compatible with pneumonia. Right apical nodular and ground-glass density is also noted to a lesser degree. There is mild diffuse interstitial prominence which may represent mild edema. There is moderate left and small right pleural effusions. No pneumothorax. The central airways are patent.  There is minimal atherosclerotic calcification of the thoracic aorta.  There is apparent peripheral filling defect in the left pulmonary artery at the bifurcation to the upper and lower lobe (series 5 image 118 - 131). This may represent pulmonary artery embolus or mass effect and compression or invasion by a centrally located left hilar mass. An ill-defined 2.0 x 1.5 cm density in the left hilar region (series 5, image 107) may represent combination of adenopathy and consolidated lung or a centrally located/partially obstructing mass. Follow-up with CT is recommended to ensure resolution of the left lung opacity and exclude an underlying hilar mass. No other central pulmonary artery embolus identified. Evaluation of the distal branches of the pulmonary arteries is limited due to respiratory motion  artifact and suboptimal visualization.  There is no cardiomegaly or pericardial effusion. Left hilar adenopathy. The esophagus is grossly unremarkable with there is a small left thyroid hypodense nodule. Ultrasound may provide better evaluation.  There is no axillary adenopathy. There is diffuse subcutaneous soft tissue stranding and edema the chest wall no fluid collection. There is degenerative changes of the spine. No acute fracture.  There is retrograde flow of contrast from the right atrium into the IVC suggestive of a degree of right cardiac dysfunction. A 1.9 cm left renal upper pole hypodense lesion is not well characterized. This is stable compared to the CT dated 11/18/2013 and likely represents a cyst.  Review of the MIP images confirms the above findings.  IMPRESSION: Left upper lobe pneumonia. An ill-defined centrally located  left hilar density may represent combination of adenopathy and consolidated portion of the lung versus a hilar mass. Follow-up is recommended to document resolution of the airspace opacity and exclude underlying hilar mass.  Apparent peripheral filling defect in the left pulmonary artery at the bifurcation of the left upper and left lower lobe branches likely represent extrinsic compression on the vessel by the hilar adenopathy/ mass or invasion of a left hilar lesion into the adjacent pulmonary artery. Acute pulmonary embolus is less likely, given peripheral orientation, but not entirely excluded.  Interstitial edema and bilateral pleural effusions, left greater Right.  CXR 6/28: FINDINGS: There is now airspace consolidation throughout the posterior segment of the left upper lobe. There is slight atelectasis in the left base. The right lung is clear. Heart size and pulmonary vascularity are within normal limits. No adenopathy. There is a benign exostosis arising from the inferior anterior right first rib. There is degenerative  change in the thoracic spine.  IMPRESSION: Airspace consolidation throughout the posterior segment of the left upper lobe consistent with pneumonia. Mild left base atelectasis. Right lung clear. Stable cardiac silhouette.  ECHO 6/29:  Study Conclusions  - Left ventricle: The cavity size was mildly dilated. Wall  thickness was normal. Systolic function was moderately to  severely reduced. The estimated ejection fraction was in the  range of 30% to 35%. Diffuse hypokinesis. The study is not  technically sufficient to allow evaluation of LV diastolic  function. - Mitral valve: Mildly thickened leaflets . There was mild  regurgitation. - Left atrium: The atrium was normal in size. - Right ventricle: Poorly visualized. - Right atrium: The atrium was normal in size. - Tricuspid valve: There was moderate regurgitation. - Pulmonary arteries: PA peak pressure: 47 mm Hg (S). - Inferior vena cava: The vessel was dilated. The respirophasic  diameter changes were blunted (< 50%), consistent with elevated  central venous pressure. - Pericardium, extracardiac: There was no pericardial effusion.  Impressions:  - LVEF 30-35%, mildly dilated with global hypokinesis, mild MR,  normal biatrial size, moderate TR, RVSP 47 mmHg, dilated IVC, no  pericardial effusion.  Bilateral LE Dopplers:  Summary:  - Findings consistent with acute deep vein thrombosis involving the  right femoral vein, right popliteal vein, right posterior tibial  veins, and right peroneal veins. - No evidence of deep vein or superficial thrombosis involving the  left lower extremity and right common femoral vein. - No evidence of Baker&'s cyst on the right or left.  Results/Tests Pending at Time of Discharge: none  Discharge Medications:    Medication List    STOP taking these medications        ibuprofen 200 MG tablet  Commonly known as:  ADVIL,MOTRIN      TAKE these medications         aspirin EC 81 MG tablet  Take 81 mg by mouth daily.     carvedilol 6.25 MG tablet  Commonly known as:  COREG  Take 1 tablet (6.25 mg total) by mouth 2 (two) times daily with a meal.     clopidogrel 75 MG tablet  Commonly known as:  PLAVIX  Take 1 tablet (75 mg total) by mouth daily with breakfast.     folic acid 1 MG tablet  Commonly known as:  FOLVITE  Take 1 tablet (1 mg total) by mouth daily.     furosemide 40 MG tablet  Commonly known as:  LASIX  Take 1 tablet (40 mg total) by mouth daily.  glipiZIDE 5 MG tablet  Commonly known as:  GLUCOTROL  Take 1 tablet (5 mg total) by mouth 2 (two) times daily before a meal.     lisinopril 5 MG tablet  Commonly known as:  PRINIVIL,ZESTRIL  Take 1 tablet (5 mg total) by mouth daily.     multivitamin with minerals Tabs tablet  Take 1 tablet by mouth daily.     OVER THE COUNTER MEDICATION  Emergen-C packets: Mix and drink one packet by mouth daily     Rivaroxaban 15 MG Tabs tablet  Commonly known as:  XARELTO  Take 1 tablet (15 mg total) by mouth 2 (two) times daily with a meal.     spironolactone 25 MG tablet  Commonly known as:  ALDACTONE  Take 0.5 tablets (12.5 mg total) by mouth daily.     thiamine 100 MG tablet  Take 1 tablet (100 mg total) by mouth daily.        Discharge Instructions: Please refer to Patient Instructions section of EMR for full details.  Patient was counseled important signs and symptoms that should prompt return to medical care, changes in medications, dietary instructions, activity restrictions, and follow up appointments.   Follow-Up Appointments: Follow-up Information    Follow up with Bevelyn Ngo, NP On 10/25/2015.   Specialty:  Pulmonary Disease   Why:  10 am   Contact information:   [address] [place] [address] [phone]       Follow up with Tonny Bollman, MD.   Specialty:  Cardiology   Why:  Cardiology MD, the office will call.   Contact information:    [address] [place] [address] [phone]       Follow up with Leland Her, DO. Go on 10/16/2015.   Why:  1:30pm (hospital follow up)   Contact information:   [address] [place] [address] [phone]       Raliegh Ip, DO 10/07/2015, 12:41 PM PGY-3, [facility]

## 2015-09-29 NOTE — Progress Notes (Signed)
ANTICOAGULATION CONSULT NOTE - Follow Up Consult  Pharmacy Consult for Heparin Indication: RLE DVT  Allergies  Allergen Reactions  . Bee Venom Anaphylaxis    Patient Measurements: Height: 6' (182.9 cm) Weight: 242 lb 9.6 oz (110.043 kg) (scale a) IBW/kg (Calculated) : 77.6 Heparin Dosing Weight: 101 kg  Labs:  Recent Labs  09/27/15 2026 09/28/15 0219  09/28/15 1401  09/28/15 2021 09/28/15 2312 09/29/15 0222 09/29/15 0652 09/29/15 1644  HGB 16.0 15.9  --   --   --   --   --  15.4  --   --   HCT 47.8 46.8  --   --   --   --   --  46.5  --   --   PLT 209 204  --   --   --   --   --  240  --   --   LABPROT  --  22.0*  --   --   --   --   --   --   --   --   INR  --  1.93*  --   --   --   --   --   --   --   --   HEPARINUNFRC  --   --   --   --   < >  --  0.12*  --  0.24* 0.41  CREATININE 0.79 0.79  --   --   --   --   --  0.86  --   --   TROPONINI 0.07* 0.06*  < > 0.07*  --  0.12*  --  0.08*  --   --   < > = values in this interval not displayed.  Estimated Creatinine Clearance: 120 mL/min (by C-G formula based on Cr of 0.86).  Assessment:   Heparin level is now therapeutic (0.41) on 2500 units/hr, after increase in rate this morning when heparin level was below goal (0.23) on 2300 units/hr.  Goal of Therapy:  Heparin level 0.3-0.7 units/ml Monitor platelets by anticoagulation protocol: Yes   Plan:   Continue heparin drip at 2500 units/hr.  Next heparin level and CBC in am.  Follow up for transition to oral anticoagulation when able.  Dennie FettersEgan, Jadine Brumley Donovan, [place] Pager: [phone] 09/29/2015,6:21 PM

## 2015-09-29 NOTE — Progress Notes (Signed)
Inpatient Diabetes Program Recommendations  AACE/ADA: New Consensus Statement on Inpatient Glycemic Control (2015)  Target Ranges:  Prepandial:   less than 140 mg/dL      Peak postprandial:   less than 180 mg/dL (1-2 hours)      Critically ill patients:  140 - 180 mg/dL  Results for Robert Hampton, Robert Hampton (MRN [id number]) as of 09/29/2015 10:12  Ref. Range 09/27/2015 20:26  Hemoglobin A1C Latest Ref Range: 4.8-5.6 % 10.4 (H)  Results for Robert Hampton, Robert Hampton (MRN [id number]) as of 09/29/2015 10:12  Ref. Range 09/28/2015 06:13 09/28/2015 11:22 09/28/2015 16:22 09/28/2015 23:09 09/29/2015 06:23  Glucose-Capillary Latest Ref Range: 65-99 mg/dL 196 (H) 275 (H) 183 (H) 173 (H) 153 (H)   Inpatient Diabetes Program Recommendations:  Please consider increase in Lantus to 20 units daily. Spoke with patient regarding A1c. Patient was diagnosed recently and has only been on the Glucotrol approx. 1 week. States "in limbo" with insurance. Reviewed with patient currently on insulin and may be discharged home on insulin. Patient states willingness to take insulin if he needs to. Nurses , please start teaching insulin administration and allow patient to do own injections as appropriate. Will order Living Well with Diabetes book, DM videos and starter kit. Gave wife and patient nutrition guide sheet and will request nutrition consult. If patient unable to afford Lantus, may need to order 70/30 insulin instead.  Thank you, Nani Gasser. Akilah Cureton, RN, MSN, CDE Inpatient Glycemic Control Team Team Pager [phone] (8am-5pm) 09/29/2015 12:02 PM

## 2015-09-29 NOTE — Progress Notes (Signed)
PULMONARY / CRITICAL CARE MEDICINE   Name: Robert Hampton MRN: [id number] DOB: 24-Sep-1955    ADMISSION DATE:  09/27/2015 CONSULTATION DATE:  09/28/15  REFERRING MD:  [facility] - Dr Gwendolyn Grant group  CHIEF COMPLAINT:  Acute resp hypoxemci failure, Possible hilar mass  HISTORY OF PRESENT ILLNESS:    60 year old smoker presenting with dyspnea Workup shows left upper lobe airspace disease with possible hilar mass, right lower extremity DVT with probable PE and new cardiomyopathy   Significant tests/ events 6/29 echo >. EF 30%, diffuse hypo 6/29 duplex >. RLE DVT CT angio 6/29 >>Left upper lobe pneumonia, ? Hilar mass  SUBJ -  Afebrile Breathing somewhat better Complaints of dry cough  VITAL SIGNS: BP 126/72 mmHg  Pulse 97  Temp(Src) 98.2 F (36.8 C) (Oral)  Resp 20  Ht 6' (1.829 m)  Wt 242 lb 9.6 oz (110.043 kg)  BMI 32.90 kg/m2  SpO2 94%  HEMODYNAMICS:    VENTILATOR SETTINGS:    INTAKE / OUTPUT: I/O last 3 completed shifts: In: 1968.5 [P.O.:820; I.V.:318.5; Other:80; IV Piggyback:750] Out: 4400 [Urine:4400]  PHYSICAL EXAMINATION: General:  Sitting up in chair in no distress Neuro:  Alert and oriented 3. Speech normal. Moves all 4 extremity is normally HEENT:  Supple neck Possible elevation in JVP no neck nodes Cardiovascular:  Regular rate and rhythm no murmurs Lungs:  Clear to auscultation bilaterally but overall diminished due to some basal crackles present Abdomen:  Obese soft nontender no obvious ascites Musculoskeletal:  Right calf with a scar from previous surgery. Right-sided varicosities present. Bilateral 2+ to 3+ pitting pedal edema present with slight worsening on the right side. No tenderness no warmth Skin:  Appears intact on the exposed areas  LABS PULMONARY  Recent Labs Lab 09/27/15 1815  TCO2 23    CBC  Recent Labs Lab 09/27/15 2026 09/28/15 0219 09/29/15 0222  HGB 16.0 15.9 15.4  HCT 47.8 46.8 46.5  WBC 20.0* 19.7* 18.3*  PLT 209  204 240    COAGULATION  Recent Labs Lab 09/28/15 0219  INR 1.93*    CARDIAC    Recent Labs Lab 09/28/15 0219 09/28/15 0745 09/28/15 1401 09/28/15 2021 09/29/15 0222  TROPONINI 0.06* 0.10* 0.07* 0.12* 0.08*   No results for input(s): PROBNP in the last 168 hours.   CHEMISTRY  Recent Labs Lab 09/27/15 1548 09/27/15 1815 09/27/15 2026 09/28/15 0219 09/29/15 0222  NA 128* 128*  --  128* 132*  K 4.6 4.7  --  4.3 4.2  CL 93* 93*  --  98* 94*  CO2 27  --   --  23 30  GLUCOSE 256* 246*  --  174* 160*  BUN 13 14  --  13 14  CREATININE 0.81 0.50* 0.79 0.79 0.86  CALCIUM 8.6*  --   --  8.2* 8.4*   Estimated Creatinine Clearance: 120 mL/min (by C-G formula based on Cr of 0.86).   LIVER  Recent Labs Lab 09/27/15 1713 09/28/15 0219 09/29/15 0222  AST 135* 104* 61*  ALT 525* 424* 284*  ALKPHOS 193* 149* 123  BILITOT 1.9* 1.5* 1.9*  PROT 6.3* 5.4* 5.3*  ALBUMIN 2.9* 2.6* 2.3*  INR  --  1.93*  --      INFECTIOUS  Recent Labs Lab 09/28/15 0745  PROCALCITON 0.21     ENDOCRINE CBG (last 3)   Recent Labs  09/28/15 1622 09/28/15 2309 09/29/15 0623  GLUCAP 183* 173* 153*         IMAGING x48h  -  image(s) personally visualized  -   highlighted in bold Dg Chest 2 View  09/27/2015  CLINICAL DATA:  Cough and shortness of breath, progressing EXAM: CHEST  2 VIEW COMPARISON:  September 19, 2015 FINDINGS: There is now airspace consolidation throughout the posterior segment of the left upper lobe. There is slight atelectasis in the left base. The right lung is clear. Heart size and pulmonary vascularity are within normal limits. No adenopathy. There is a benign exostosis arising from the inferior anterior right first rib. There is degenerative change in the thoracic spine. IMPRESSION: Airspace consolidation throughout the posterior segment of the left upper lobe consistent with pneumonia. Mild left base atelectasis. Right lung clear. Stable cardiac silhouette.  These results will be called to the ordering clinician or representative by the Radiologist Assistant, and communication documented in the PACS or zVision Dashboard. Electronically Signed   By: Bretta BangWilliam  Woodruff III M.D.   On: 09/27/2015 14:35   Ct Angio Chest Pe W Or Wo Contrast  09/28/2015  CLINICAL DATA:  60 year old male with shortness of breath. EXAM: CT ANGIOGRAPHY CHEST WITH CONTRAST TECHNIQUE: Multidetector CT imaging of the chest was performed using the standard protocol during bolus administration of intravenous contrast. Multiplanar CT image reconstructions and MIPs were obtained to evaluate the vascular anatomy. CONTRAST:  100 cc Isovue 370 COMPARISON:  Chest radiograph dated 09/27/2015 FINDINGS: Evaluation of this exam is limited due to respiratory motion artifact. There is an area of ground-glass on nodular airspace opacity involving the posterior apical segment of the left upper lobe most compatible with pneumonia. Right apical nodular and ground-glass density is also noted to a lesser degree. There is mild diffuse interstitial prominence which may represent mild edema. There is moderate left and small right pleural effusions. No pneumothorax. The central airways are patent. There is minimal atherosclerotic calcification of the thoracic aorta. There is apparent peripheral filling defect in the left pulmonary artery at the bifurcation to the upper and lower lobe (series 5 image 118 - 131). This may represent pulmonary artery embolus or mass effect and compression or invasion by a centrally located left hilar mass. An ill-defined 2.0 x 1.5 cm density in the left hilar region (series 5, image 107) may represent combination of adenopathy and consolidated lung or a centrally located/partially obstructing mass. Follow-up with CT is recommended to ensure resolution of the left lung opacity and exclude an underlying hilar mass. No other central pulmonary artery embolus identified. Evaluation of the distal  branches of the pulmonary arteries is limited due to respiratory motion artifact and suboptimal visualization. There is no cardiomegaly or pericardial effusion. Left hilar adenopathy. The esophagus is grossly unremarkable with there is a small left thyroid hypodense nodule. Ultrasound may provide better evaluation. There is no axillary adenopathy. There is diffuse subcutaneous soft tissue stranding and edema the chest wall no fluid collection. There is degenerative changes of the spine. No acute fracture. There is retrograde flow of contrast from the right atrium into the IVC suggestive of a degree of right cardiac dysfunction. A 1.9 cm left renal upper pole hypodense lesion is not well characterized. This is stable compared to the CT dated 11/18/2013 and likely represents a cyst. Review of the MIP images confirms the above findings. IMPRESSION: Left upper lobe pneumonia. An ill-defined centrally located left hilar density may represent combination of adenopathy and consolidated portion of the lung versus a hilar mass. Follow-up is recommended to document resolution of the airspace opacity and exclude underlying hilar mass. Apparent peripheral  filling defect in the left pulmonary artery at the bifurcation of the left upper and left lower lobe branches likely represent extrinsic compression on the vessel by the hilar adenopathy/ mass or invasion of a left hilar lesion into the adjacent pulmonary artery. Acute pulmonary embolus is less likely, given peripheral orientation, but not entirely excluded. Interstitial edema and bilateral pleural effusions, left greater right. These results were called by telephone at the time of interpretation on 09/28/2015 at 12:52 am to Dr. Wende MottMcKeag, who verbally acknowledged these results. Electronically Signed   By: Elgie CollardArash  Radparvar M.D.   On: 09/28/2015 00:56   [place] Abdomen Complete  09/28/2015  CLINICAL DATA:  Transaminitis EXAM: ABDOMEN ULTRASOUND COMPLETE COMPARISON:  CT scan November 18, 2013 FINDINGS: Gallbladder: The gallbladder wall is mildly thickened measuring 5 mm. There is slight pericholecystic fluid. No stones, sludge, or Murphy's sign. Common bile duct: Diameter: 3 mm Liver: No focal lesion identified. Within normal limits in parenchymal echogenicity. IVC: No abnormality visualized. Pancreas: The pancreas was not visualized. Spleen: Size and appearance within normal limits. Right Kidney: Length: 13.4 cm. Known renal cysts. The largest measures 2.3 cm, not changed since August 2015. Left Kidney: Length: 12.9 cm.  Known renal cysts. Abdominal aorta: No aneurysm visualized. Other findings: None. IMPRESSION: 1. Mild gallbladder wall thickening and a tiny amount of pericholecystic fluid identified. No stones, sludge, or Murphy's sign. If there is concern for acute cholecystitis, a HIDA scan could better evaluate. 2. Bilateral renal cysts. Electronically Signed   By: Gerome Samavid  Williams III M.D   On: 09/28/2015 18:02        ASSESSMENT / PLAN:  PULMONARY A:  acute hypoxemic respiratory failure in a smoker. -RLE DVT with possible PE - LUL CAP -possible left hilar masss  P:  Continue antibiotics He will need follow-up CT imaging with contrast if possible in 4-6 weeks to evaluate left hilar mass, after resolution of pneumonia on chest x-ray  -Continue IV heparin for now can switch to oral anticoagulation when able  New cardiomyopathy-  being worked up by cardiology -diuresis   Please arrange for a follow-up office visit with pulmonary on discharge in 4-6 weeks [facility] as needed  Cyril Mourningakesh Hodari Chuba MD. FCCP. [facility] Pager [phone] If no response call [phone]    09/29/2015 12:15 PM

## 2015-09-29 NOTE — Progress Notes (Signed)
Family Medicine Teaching Service Daily Progress Note Intern Pager: [phone]  Patient name: Robert Hampton Medical record number: [id number] Date of birth: 1956-01-02 Age: 60 y.o. Gender: male  Primary Care Provider: No PCP Per Patient Consultants: Pulmonology Code Status: FULL  Pt Overview and Major Events to Date:  6/28: admit for SOB  Assessment and Plan: Robert Hampton is a 60 y.o. male presenting with shortness of breath and cough with LE swelling and jaundice, concern for PNA and also possible new CHF and also found to have RLE DVT. PMH is significant for T2DM, tobacco abuse.  RLE DVT:  - heparin per pharmacy  - per cardiology, keep on heparin drip for know in anticipation of ischemic work up  - will likely eventually transition to Coumadin in the setting of possible mass on CT chest   CAP: Leukocytosis improving this AM (20 > 19.7 > 18.3). Slight subjective improvement of respiratory symptoms. Some intermittent expiratory wheezing noted on exam today.  - CTA chest: Shows LUL PNA and left hilar density- adenopathy vs consolidation vs hilar mass; peripheral filling defect in L pulm artery likely extrinsic compression by hilar adenopathy/mass or invasion of L hilar lesion into adjacent PA; shows some interstitial edema and bilateral pleural effusions L >R  - Vanc/Zosyn (6/28 >) - transition  to Levaquin (patient did not fail therapy as outpatient) 750mg  for total 5 days of antibiotics  - Urinary Strep (negative) and Legionella pending - Blood cultures: NG < 24 hours - Cardiac monitoring and continuous pulse ox - xoponex x1 to determine if helps with breathing  New Acute CHF with hepatic congestion:  BNP was 1788 in the ED. ECHO: with EF 30-35%, mildly dilated with global hypokinesis, RVSP 47mmHg, dilated IVC. Troponin 0.07 > 0.06 > 0.1 > 0.07 >0.12 > 0.08 (demand ischemia) Continued tachycardia although slightly improved. On 2L [place] with good saturations and improved RR. Urine  output: 2.95L overnight  Weight: 248lb >246lb > 242lb. Creatinine 0.86 (Baseline ~ 0.8) UA with 100 protein.  - pulm consult: continue lasix and follow up on imaging and labs; will need follow up CT chest in several months  - cardiology consulted, appreciate reccs: HF unclear in etiology. Added ACEi and BB, pt would benefit from aldosterone blockade, ischemic w/u after diuresis, f/u ECHO in 3 months  - IV Lasix 40mg  BID > increase to TID - Lisinopril 2.5mg  daily - Coreg 3.125 BID   Anasarca: Abdominal swelling with possible fluid shift. Scrotal swelling improving per patient. Likely due to CHF. US abdomen unremarkable   - note above   Elevated Liver Enzymes / Bilirubin / Jaundice, improving: Due to hepatic congestion in the setting of acute CHF exacerbation. No abdominal pain. AST 135 >104 > 61, ALT 525 >424 > 284. Total bilirubin 1.9 > 1.5 > 1.9. Drinks 2-3 glasses of wine per day. - Hepatitis panel negative  - TSH (nml), Ammonia (32), PT/INR (22/1.93)  Hyponatremia, improving: Na 128 > 132. Likely secondary to fluid overloaded and/or due to alcohol use.  - IV Lasix  Type 2 DM: Diagnosed 1 week ago. CBG 153-275, AM 163. A1c 10.4  - start Lantus 4 units q AM - Moderate SSI - CBGs with meals and at bedtime  Hypertension: BP still elevated but improving, 126-148/72-95, AM 126/72 - Lisinopril 2.5mg  - Coreg 3.125mg  BID - Hydralazine prn  Tobacco abuse: 3/4 PPD for 45 years - Declines Nicotine patch at this time.  Alcohol use: Drinks 2-3 glasses of wine per day. - CIWA (  0 )  FEN/GI: Heart healthy diet, Miralax prn, SLIV Prophylaxis: Heparin drip  Disposition: pending management  Subjective:  Reports breathing may be slightly better. Continues to have cough that is nonproductive. Reports LE and scrotal swelling is improving.   Objective: Temp:  [98.2 F (36.8 C)-99.3 F (37.4 C)] 98.2 F (36.8 C) (06/30 0443) Pulse Rate:  [97-114] 97 (06/30 0443) Resp:  [20-24] 20 (06/30  0443) BP: (126-158)/(72-95) 126/72 mmHg (06/30 0443) SpO2:  [92 %-98 %] 92 % (06/30 0443) Weight:  [110.043 kg (242 lb 9.6 oz)-111.812 kg (246 lb 8 oz)] 110.043 kg (242 lb 9.6 oz) (06/30 0443) Physical Exam: General: NAD, pleasant  HEENT: scleral icterus Neck: no JVD Cardiovascular: tachycardia, RR, no m/r/g Respiratory: tachypnea, on [place] 2 L, gets short of breath with speaking improves with elevation of head of bed. Crackles noted bilaterally with L >R. Intermittent wheezing noted  Abdomen: soft, NT, distended Extremities: lower extremity edema with R >L. Pitting edema up to knee bilaterally, no increased warmth; no calf tenderness this AM   Laboratory:  Recent Labs Lab 09/27/15 2026 09/28/15 0219 09/29/15 0222  WBC 20.0* 19.7* 18.3*  HGB 16.0 15.9 15.4  HCT 47.8 46.8 46.5  PLT 209 204 240    Recent Labs Lab 09/27/15 1548 09/27/15 1713 09/27/15 1815 09/27/15 2026 09/28/15 0219 09/29/15 0222  NA 128*  --  128*  --  128* 132*  K 4.6  --  4.7  --  4.3 4.2  CL 93*  --  93*  --  98* 94*  CO2 27  --   --   --  23 30  BUN 13  --  14  --  13 14  CREATININE 0.81  --  0.50* 0.79 0.79 0.86  CALCIUM 8.6*  --   --   --  8.2* 8.4*  PROT  --  6.3*  --   --  5.4* 5.3*  BILITOT  --  1.9*  --   --  1.5* 1.9*  ALKPHOS  --  193*  --   --  149* 123  ALT  --  525*  --   --  424* 284*  AST  --  135*  --   --  104* 61*  GLUCOSE 256*  --  246*  --  174* 160*  Troponin: 0.07 > 0.06 BNP 1788  Imaging/Diagnostic Tests: US Abdomen:  1. Mild gallbladder wall thickening and a tiny amount of pericholecystic fluid identified. No stones, sludge, or Murphy's sign. If there is concern for acute cholecystitis, a HIDA scan could better evaluate. 2. Bilateral renal cysts.  ECHO 6/29:  LVEF 30-35%, mildly dilated with global hypokinesis, mild MR, normal biatrial size, moderate TR, RVSP 47 mmHg, dilated IVC, no pericardial effusion.  CTA Chest: 6/28: FINDINGS: Evaluation of this exam is  limited due to respiratory motion artifact.  There is an area of ground-glass on nodular airspace opacity involving the posterior apical segment of the left upper lobe most compatible with pneumonia. Right apical nodular and ground-glass density is also noted to a lesser degree. There is mild diffuse interstitial prominence which may represent mild edema. There is moderate left and small right pleural effusions. No pneumothorax. The central airways are patent.  There is minimal atherosclerotic calcification of the thoracic aorta.  There is apparent peripheral filling defect in the left pulmonary artery at the bifurcation to the upper and lower lobe (series 5 image 118 - 131). This may represent pulmonary artery embolus or mass  effect and compression or invasion by a centrally located left hilar mass. An ill-defined 2.0 x 1.5 cm density in the left hilar region (series 5, image 107) may represent combination of adenopathy and consolidated lung or a centrally located/partially obstructing mass. Follow-up with CT is recommended to ensure resolution of the left lung opacity and exclude an underlying hilar mass. No other central pulmonary artery embolus identified. Evaluation of the distal branches of the pulmonary arteries is limited due to respiratory motion artifact and suboptimal visualization.  There is no cardiomegaly or pericardial effusion. Left hilar adenopathy. The esophagus is grossly unremarkable with there is a small left thyroid hypodense nodule. Ultrasound may provide better evaluation.  There is no axillary adenopathy. There is diffuse subcutaneous soft tissue stranding and edema the chest wall no fluid collection. There is degenerative changes of the spine. No acute fracture.  There is retrograde flow of contrast from the right atrium into the IVC suggestive of a degree of right cardiac dysfunction. A 1.9 cm left renal upper pole hypodense lesion is not well  characterized. This is stable compared to the CT dated 11/18/2013 and likely represents a cyst.  Review of the MIP images confirms the above findings.  IMPRESSION: Left upper lobe pneumonia. An ill-defined centrally located left hilar density may represent combination of adenopathy and consolidated portion of the lung versus a hilar mass. Follow-up is recommended to document resolution of the airspace opacity and exclude underlying hilar mass.  Apparent peripheral filling defect in the left pulmonary artery at the bifurcation of the left upper and left lower lobe branches likely represent extrinsic compression on the vessel by the hilar adenopathy/ mass or invasion of a left hilar lesion into the adjacent pulmonary artery. Acute pulmonary embolus is less likely, given peripheral orientation, but not entirely excluded.  Interstitial edema and bilateral pleural effusions, left greater right.  Bilateral LE Venous Dopplers 6/29:  - Findings consistent with acute deep vein thrombosis involving the  right femoral vein, right popliteal vein, right posterior tibial  veins, and right peroneal veins. - No evidence of deep vein or superficial thrombosis involving the  left lower extremity and right common femoral vein. - No evidence of Baker&'s cyst on the right or left.  Palma Holter, MD 09/29/2015, 6:00 AM PGY-1, [facility] Family Medicine FPTS Intern pager: [phone], text pages welcome

## 2015-09-29 NOTE — Progress Notes (Signed)
Nutrition Education Note  RD consulted for nutrition education regarding new onset CHF.  RD provided "Low Sodium Nutrition Therapy" handout from the Academy of [employer] and [employer]etics. Reviewed patient's dietary recall. Provided examples on ways to decrease sodium intake in diet. Discouraged intake of processed foods and use of salt shaker. Encouraged fresh fruits and vegetables as well as whole grain sources of carbohydrates to maximize fiber intake. Pt states that he has a garden with a variety of herbs at home that he can use to flavor his food. He will stop using salt and eliminate ham from his diet.  Pt reports being started on a oral medication for diabetes one week ago. Pt not feeling well at time of visit making it difficult to continue learning. RD provided "Type 2 Diabetes Nutrition Therapy" handout from the Academy of [employer] and Dietetics. Briefly discussed diabetic diet and how to balance meals and snacks. Recommended whole grains and vegetables.   RD discussed why it is important for patient to adhere to low sodium diet recommendations, and emphasized the role of fluids, foods to avoid, and importance of weighing self daily. Teach back method used.  Expect good compliance.  Body mass index is 32.9 kg/(m^2). Pt meets criteria for Obesity based on current BMI.  Current diet order is Heart Healthy/Carb Modified, patient is consuming approximately 75% of meals at this time. Labs and medications reviewed. No further nutrition interventions warranted at this time. RD contact information provided. If additional nutrition issues arise, please re-consult RD.   Dorothea Ogleeanne Jemarcus Dougal RD, LDN Inpatient Clinical Dietitian Pager: [phone] After Hours Pager: [phone]

## 2015-09-29 NOTE — Progress Notes (Signed)
ANTICOAGULATION CONSULT NOTE - Follow Up Consult  Pharmacy Consult for heparin Indication: DVT  Allergies  Allergen Reactions  . Bee Venom Anaphylaxis    Patient Measurements: Height: 6' (182.9 cm) Weight: 246 lb 8 oz (111.812 kg) (Scale A) IBW/kg (Calculated) : 77.6 Heparin Dosing Weight: 101 kg  Vital Signs: Temp: 99.3 F (37.4 C) (06/29 2025) BP: 142/82 mmHg (06/29 2025) Pulse Rate: 114 (06/29 2025)  Labs:  Recent Labs  09/27/15 1548 09/27/15 1815  09/27/15 2026 09/28/15 0219 09/28/15 0745 09/28/15 1401 09/28/15 1551 09/28/15 2021 09/28/15 2312  HGB 16.9 18.4*  --  16.0 15.9  --   --   --   --   --   HCT 49.7 54.0*  --  47.8 46.8  --   --   --   --   --   PLT 210  --   --  209 204  --   --   --   --   --   LABPROT  --   --   --   --  22.0*  --   --   --   --   --   INR  --   --   --   --  1.93*  --   --   --   --   --   HEPARINUNFRC  --   --   --   --   --   --   --  <0.10*  --  0.12*  CREATININE 0.81 0.50*  --  0.79 0.79  --   --   --   --   --   TROPONINI  --   --   < > 0.07* 0.06* 0.10* 0.07*  --  0.12*  --   < > = values in this interval not displayed.  Estimated Creatinine Clearance: 130 mL/min (by C-G formula based on Cr of 0.79).   Assessment: 60 yo male on heparin for DVT. Heparin level remains subtherapeutic on 1900 units/hr. No issues with line or bleeding reported per RN.  Goal of Therapy:  Heparin level 0.3-0.7 units/ml Monitor platelets by anticoagulation protocol: Yes   Plan:  - Give 3000 units bolus and increase heparin to 2300 units/hr - 6hr heparin level  Christoper Fabianaron Zuleyka Kloc, PharmD, BCPS Clinical pharmacist, pager [phone] 09/29/2015,12:17 AM

## 2015-09-30 DIAGNOSIS — I5021 Acute systolic (congestive) heart failure: Secondary | ICD-10-CM

## 2015-09-30 DIAGNOSIS — R918 Other nonspecific abnormal finding of lung field: Secondary | ICD-10-CM

## 2015-09-30 DIAGNOSIS — J9601 Acute respiratory failure with hypoxia: Secondary | ICD-10-CM

## 2015-09-30 DIAGNOSIS — E1310 Other specified diabetes mellitus with ketoacidosis without coma: Secondary | ICD-10-CM

## 2015-09-30 DIAGNOSIS — Z720 Tobacco use: Secondary | ICD-10-CM

## 2015-09-30 HISTORY — DX: Acute systolic (congestive) heart failure: I50.21

## 2015-09-30 HISTORY — DX: Acute respiratory failure with hypoxia: J96.01

## 2015-09-30 LAB — COMPREHENSIVE METABOLIC PANEL
ALT: 178 U/L — ABNORMAL HIGH (ref 17–63)
ANION GAP: 10 (ref 5–15)
AST: 35 U/L (ref 15–41)
Albumin: 2 g/dL — ABNORMAL LOW (ref 3.5–5.0)
Alkaline Phosphatase: 108 U/L (ref 38–126)
BILIRUBIN TOTAL: 1.2 mg/dL (ref 0.3–1.2)
BUN: 14 mg/dL (ref 6–20)
CO2: 29 mmol/L (ref 22–32)
Calcium: 8.4 mg/dL — ABNORMAL LOW (ref 8.9–10.3)
Chloride: 93 mmol/L — ABNORMAL LOW (ref 101–111)
Creatinine, Ser: 0.88 mg/dL (ref 0.61–1.24)
Glucose, Bld: 208 mg/dL — ABNORMAL HIGH (ref 65–99)
POTASSIUM: 4.1 mmol/L (ref 3.5–5.1)
Sodium: 132 mmol/L — ABNORMAL LOW (ref 135–145)
TOTAL PROTEIN: 5 g/dL — AB (ref 6.5–8.1)

## 2015-09-30 LAB — CBC
HEMATOCRIT: 48 % (ref 39.0–52.0)
Hemoglobin: 15.4 g/dL (ref 13.0–17.0)
MCH: 31.2 pg (ref 26.0–34.0)
MCHC: 32.1 g/dL (ref 30.0–36.0)
MCV: 97.2 fL (ref 78.0–100.0)
PLATELETS: 270 10*3/uL (ref 150–400)
RBC: 4.94 MIL/uL (ref 4.22–5.81)
RDW: 14.6 % (ref 11.5–15.5)
WBC: 15.4 10*3/uL — ABNORMAL HIGH (ref 4.0–10.5)

## 2015-09-30 LAB — BRAIN NATRIURETIC PEPTIDE: B NATRIURETIC PEPTIDE 5: 1061.5 pg/mL — AB (ref 0.0–100.0)

## 2015-09-30 LAB — PROCALCITONIN: Procalcitonin: 0.19 ng/mL

## 2015-09-30 LAB — GLUCOSE, CAPILLARY
GLUCOSE-CAPILLARY: 274 mg/dL — AB (ref 65–99)
Glucose-Capillary: 231 mg/dL — ABNORMAL HIGH (ref 65–99)
Glucose-Capillary: 241 mg/dL — ABNORMAL HIGH (ref 65–99)
Glucose-Capillary: 201 mg/dL — ABNORMAL HIGH (ref 65–99)

## 2015-09-30 LAB — HEPARIN LEVEL (UNFRACTIONATED): Heparin Unfractionated: 0.43 IU/mL (ref 0.30–0.70)

## 2015-09-30 MED ORDER — INSULIN GLARGINE 100 UNIT/ML ~~LOC~~ SOLN
7.0000 [IU] | Freq: Every day | SUBCUTANEOUS | Status: DC
Start: 2015-09-30 — End: 2015-10-07
  Administered 2015-09-30 – 2015-10-07 (×7): 7 [IU] via SUBCUTANEOUS
  Filled 2015-09-30 (×9): qty 0.07

## 2015-09-30 NOTE — Progress Notes (Signed)
ANTICOAGULATION CONSULT NOTE - Follow Up Consult  Pharmacy Consult for Heparin Indication: RLE DVT  Allergies  Allergen Reactions  . Bee Venom Anaphylaxis    Patient Measurements: Height: 6' (182.9 cm) Weight: 236 lb 4.8 oz (107.185 kg) (Scale A) IBW/kg (Calculated) : 77.6 Heparin Dosing Weight: 101 kg  Labs:  Recent Labs  09/28/15 0219  09/28/15 1401  09/28/15 2021  09/29/15 0222 09/29/15 0652 09/29/15 1644 09/30/15 0508  HGB 15.9  --   --   --   --   --  15.4  --   --  15.4  HCT 46.8  --   --   --   --   --  46.5  --   --  48.0  PLT 204  --   --   --   --   --  240  --   --  270  LABPROT 22.0*  --   --   --   --   --   --   --   --   --   INR 1.93*  --   --   --   --   --   --   --   --   --   HEPARINUNFRC  --   --   --   < >  --   < >  --  0.24* 0.41 0.43  CREATININE 0.79  --   --   --   --   --  0.86  --   --  0.88  TROPONINI 0.06*  < > 0.07*  --  0.12*  --  0.08*  --   --   --   < > = values in this interval not displayed.  Estimated Creatinine Clearance: 115.7 mL/min (by C-G formula based on Cr of 0.88).  Assessment: 60 yo m on heparin for new DVT. Heparin levels have been therapeutic x 2 on 2500 units/hr. CBC stable, no issues per RN.    Goal of Therapy:  Heparin level 0.3-0.7 units/ml Monitor platelets by anticoagulation protocol: Yes   Plan:  - Continue heparin infusion at 2500 units/hr - Daily HL, CBC - F/u transition to warfarin/OAC  Cassie L. Roseanne RenoStewart, PharmD Clinical Pharmacist Pager: [phone] 09/30/2015 12:27 PM

## 2015-09-30 NOTE — Progress Notes (Signed)
Subjective:  Breathing better. Diuresed an additional 3.7L negative overnight - now 6.2L negative. Creatinine stable. BNP 1061.  Objective:  Vital Signs in the last 24 hours: Temp:  [98.2 F (36.8 C)-98.8 F (37.1 C)] 98.4 F (36.9 C) (07/01 0700) Pulse Rate:  [92-100] 92 (07/01 0700) Resp:  [20-21] 20 (07/01 0531) BP: (115-147)/(75-92) 136/82 mmHg (07/01 0700) SpO2:  [92 %-96 %] 96 % (07/01 0700) Weight:  [236 lb 4.8 oz (107.185 kg)] 236 lb 4.8 oz (107.185 kg) (07/01 0531)  Intake/Output from previous day:  Intake/Output Summary (Last 24 hours) at 09/30/15 1018 Last data filed at 09/30/15 0900  Gross per 24 hour  Intake 1548.1 ml  Output   5050 ml  Net -3501.9 ml    Physical Exam: General appearance: alert, cooperative, no distress and moderately obese Neck: no carotid bruit and no JVD Lungs: decreased at both bases Heart: regular rate and rhythm Extremities: 1+ RLE edema Skin: pale, cool, dry Neurologic: Grossly normal   Rate: 96  Rhythm: normal sinus rhythm and premature ventricular contractions (PVC)  Lab Results:  Recent Labs  09/29/15 0222 09/30/15 0508  WBC 18.3* 15.4*  HGB 15.4 15.4  PLT 240 270    Recent Labs  09/29/15 0222 09/30/15 0508  NA 132* 132*  K 4.2 4.1  CL 94* 93*  CO2 30 29  GLUCOSE 160* 208*  BUN 14 14  CREATININE 0.86 0.88    Recent Labs  09/28/15 2021 09/29/15 0222  TROPONINI 0.12* 0.08*    Recent Labs  09/28/15 0219  INR 1.93*   BNP (last 3 results)  Recent Labs  09/27/15 1622 09/30/15 0514  BNP 1788.2* 1061.5*    ProBNP (last 3 results) No results for input(s): PROBNP in the last 8760 hours.  Scheduled Meds: . aspirin EC  81 mg Oral Daily  . carvedilol  3.125 mg Oral BID WC  . folic acid  1 mg Oral Daily  . furosemide  40 mg Intravenous TID  . insulin aspart  0-15 Units Subcutaneous TID WC  . insulin aspart  0-5 Units Subcutaneous QHS  . insulin glargine  7 Units Subcutaneous Daily  .  levofloxacin  750 mg Oral QAC lunch  . lisinopril  5 mg Oral Daily  . multivitamin with minerals  1 tablet Oral Daily  . sodium chloride flush  3 mL Intravenous Q12H  . sodium chloride flush  3 mL Intravenous Q12H  . spironolactone  12.5 mg Oral Daily  . thiamine  100 mg Oral Daily   Or  . thiamine  100 mg Intravenous Daily   Continuous Infusions: . heparin 2,500 Units/hr (09/30/15 0443)   PRN Meds:.sodium chloride, benzonatate, hydrALAZINE, LORazepam **OR** LORazepam, ondansetron **OR** ondansetron (ZOFRAN) IV, polyethylene glycol, sodium chloride flush   Imaging: [place] Abdomen Complete  09/28/2015  CLINICAL DATA:  Transaminitis EXAM: ABDOMEN ULTRASOUND COMPLETE COMPARISON:  CT scan November 18, 2013 FINDINGS: Gallbladder: The gallbladder wall is mildly thickened measuring 5 mm. There is slight pericholecystic fluid. No stones, sludge, or Murphy's sign. Common bile duct: Diameter: 3 mm Liver: No focal lesion identified. Within normal limits in parenchymal echogenicity. IVC: No abnormality visualized. Pancreas: The pancreas was not visualized. Spleen: Size and appearance within normal limits. Right Kidney: Length: 13.4 cm. Known renal cysts. The largest measures 2.3 cm, not changed since August 2015. Left Kidney: Length: 12.9 cm.  Known renal cysts. Abdominal aorta: No aneurysm visualized. Other findings: None. IMPRESSION: 1. Mild gallbladder wall thickening and a tiny amount  of pericholecystic fluid identified. No stones, sludge, or Murphy's sign. If there is concern for acute cholecystitis, a HIDA scan could better evaluate. 2. Bilateral renal cysts. Electronically Signed   By: Gerome Samavid  Williams III M.D   On: 09/28/2015 18:02    Cardiac Studies: Echo 09/28/15 Impressions:  - LVEF 30-35%, mildly dilated with global hypokinesis, mild MR,  normal biatrial size, moderate TR, RVSP 47 mmHg, dilated IVC, no  pericardial effusion.  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 He admits to several week history of increasing shortness of breath and right lower extremity discomfort and swelling. He was admitted 09/27/15 with acute systolic heart failure. He has a right lower extremity DVT and possible mass vs adenopathy from his pneumonia in his lung for which follow-up CT scan is planned. He is on heparin anticoagulation for his DVT. His BNP was significantly elevated at [id number] consistent with acute heart failure. He is a diabetic with glucose over 300 and greater than 1000 mg of urinary glucose on admission. He has sinus tachycardia undoubtedly contributed by his CHF and volume overload. He has diuresed 8 lbs and 3.6L since admission. Echo shows an EF of 30-35% with global HK.   Principal Problem:   Acute respiratory failure with hypoxia (HCC) Active Problems:   Uncontrolled type 2 DM- on oral agent    Acute systolic CHF    Anasarca   Elevated liver enzymes   CAP (community acquired pneumonia)-LUL   Cardiomyopathy- etiology not yet determined- appears to be NICM   ETOH abuse-daily drinker   Smoker-quit "9 days ago"   DVT-Rt lower extremity (HCC)   Pulmonary hypertension (HCC)   Abnormal CT scan of lung   PLAN:  Continue IV diuretics today. B-blocker increased today. Will plan for ischemia evaluation next week.  Chrystie NoseKenneth C. Robert Wasco, MD, [facility]oscopy[facility]ACC Attending Cardiologist [facility]  09/30/2015, 10:18 AM

## 2015-09-30 NOTE — Progress Notes (Signed)
Family Medicine Teaching Service Daily Progress Note Intern Pager: [phone]  Patient name: Robert GeneraJonathan Hare Medical record number: [id number] Date of birth: 03/01/1956 Age: 60 y.o. Gender: male  Primary Care Provider: No PCP Per Patient Consultants: Pulmonology Code Status: FULL  Pt Overview and Major Events to Date:  6/28: admit for SOB: CHF, PNA, DVT 6/30: increased Lasix to TID  Assessment and Plan: Robert Hampton is a 60 y.o. male presenting with shortness of breath and cough with LE swelling and jaundice, concern for PNA and also possible new CHF and also found to have RLE DVT. PMH is significant for T2DM, tobacco abuse.  RLE DVT: Additionally unable to rule out PE on CTA chest - heparin per pharmacy  - per cardiology, keep on heparin drip for know in anticipation of ischemic work up  - will likely eventually transition to Coumadin in the setting of possible mass on CT chest   CAP: Leukocytosis improving this AM (20 >>15.4). Respiratory status is improved- not dyspneic when speaking, no tachypnea or increased wob. Crackles L > R lung base. No wheezing.  - CTA chest: Shows LUL PNA and left hilar density- adenopathy vs consolidation vs hilar mass; peripheral filling defect in L pulm artery likely extrinsic compression by hilar adenopathy/mass or invasion of L hilar lesion into adjacent PA; shows some interstitial edema and bilateral pleural effusions L >R  - Vanc/Zosyn (6/28 >) - Levaquin 750mg  (6/30) for total 5 days of antibiotics  - Urinary Strep (negative) and Legionella negative - Blood cultures: NG x 2 day  - Cardiac monitoring and continuous pulse ox - xoponex x1 to determine if helps with breathing - wean O2 as tolerated   New Acute HFrEF with hepatic congestion:  BNP was 1788 in the ED. ECHO: with EF 30-35%, mildly dilated with global hypokinesis, RVSP 47mmHg, dilated IVC. Repeat BNP 7/1 1061.5 Tachycardia resolved. On 2L [place] with good saturations and normal RR. Urine  output: 5.43L overnight, net -6.56L  Weight: 248lb >236lb. Creatinine stable 0.88 (Baseline ~ 0.8)  - cardiology consulted, appreciate reccs: HF unclear in etiology. Likely need myoview once stable; medication changes noted below - IV Lasix 40mg  TID - Lisinopril 5mg  daily (increased 6/30) - Coreg 6.25 BID (increased 7/1) - Spironolactone 12.5mg  daily (with plan to titrate BID)  - wean O2 as tolerated   Elevated Liver Enzymes / Bilirubin / Jaundice 2/2 hepatic congestion, improving: Due to hepatic congestion in the setting of acute CHF exacerbation. No abdominal pain. AST 135 >104 > 61 > 35, ALT 525 >424 > 284 >178. Total bilirubin 1.9 > 1.5 > 1.9 > 1.2. Drinks 2-3 glasses of wine per day. Abdominal US unremarkable. Hepatitis panel negative  - TSH (nml), Ammonia (32), PT/INR (22/1.93)  Possible Left Hilar Mass on Imaging: - pulmonology consulted: will need follow up CT imaging with contrast if possible in 4-6 weeks to evaluate left hilar mass; f/u outpatient in 4-6 weeks.   Hyponatremia, improving: Na 128 > 132. Likely secondary to fluid overloaded and/or due to alcohol use.  - IV Lasix  Type 2 DM: Diagnosed 1 week ago. CBG 153-250, AM 231. A1c 10.4  - increase Lantus to 7u daily - Moderate SSI - CBGs with meals and at bedtime  Hypertension: BP 115-147/75-92 - Lisinopril 5mg  - Coreg 6.25mg  BID - Spironolactone 12.5mg  daily  - Hydralazine prn  Tobacco abuse: 3/4 PPD for 45 years - Declines Nicotine patch at this time.  Alcohol use: Drinks 2-3 glasses of wine per day. - CIWA (  0 )  FEN/GI: Heart healthy diet, Miralax prn, SLIV Prophylaxis: Heparin drip  Disposition: pending management  Subjective:  Reports breathing is better today; able to lay flatter in bed. Reports LE swelling is about the same.   Objective: Temp:  [98.2 F (36.8 C)-98.8 F (37.1 C)] 98.4 F (36.9 C) (07/01 0531) Pulse Rate:  [95-100] 100 (07/01 0531) Resp:  [20-21] 20 (07/01 0531) BP:  (115-147)/(75-92) 147/92 mmHg (07/01 0531) SpO2:  [92 %-96 %] 96 % (07/01 0531) Weight:  [107.185 kg (236 lb 4.8 oz)] 107.185 kg (236 lb 4.8 oz) (07/01 0531) Physical Exam: General: NAD, pleasant  HEENT: scleral icterus resolved Neck: no JVD Cardiovascular: tachycardia, RR, no m/r/g Respiratory:  No dyspnea when speaking, no tachypena noted (improvement) Crackles noted bilaterally with L >R. No wheezing today  Abdomen: soft, NT, distended Extremities: lower extremity edema with R >L. Pitting edema up to knee bilaterally, no increased warmth; no calf tenderness this AM   Laboratory:  Recent Labs Lab 09/27/15 2026 09/28/15 0219 09/29/15 0222  WBC 20.0* 19.7* 18.3*  HGB 16.0 15.9 15.4  HCT 47.8 46.8 46.5  PLT 209 204 240    Recent Labs Lab 09/27/15 1548 09/27/15 1713 09/27/15 1815 09/27/15 2026 09/28/15 0219 09/29/15 0222  NA 128*  --  128*  --  128* 132*  K 4.6  --  4.7  --  4.3 4.2  CL 93*  --  93*  --  98* 94*  CO2 27  --   --   --  23 30  BUN 13  --  14  --  13 14  CREATININE 0.81  --  0.50* 0.79 0.79 0.86  CALCIUM 8.6*  --   --   --  8.2* 8.4*  PROT  --  6.3*  --   --  5.4* 5.3*  BILITOT  --  1.9*  --   --  1.5* 1.9*  ALKPHOS  --  193*  --   --  149* 123  ALT  --  525*  --   --  424* 284*  AST  --  135*  --   --  104* 61*  GLUCOSE 256*  --  246*  --  174* 160*  Troponin: 0.07 > 0.06 BNP 1788  Imaging/Diagnostic Tests: US Abdomen:  1. Mild gallbladder wall thickening and a tiny amount of pericholecystic fluid identified. No stones, sludge, or Murphy's sign. If there is concern for acute cholecystitis, a HIDA scan could better evaluate. 2. Bilateral renal cysts.  ECHO 6/29:  LVEF 30-35%, mildly dilated with global hypokinesis, mild MR, normal biatrial size, moderate TR, RVSP 47 mmHg, dilated IVC, no pericardial effusion.  CTA Chest: 6/28: FINDINGS: Evaluation of this exam is limited due to respiratory motion artifact.  There is an area of  ground-glass on nodular airspace opacity involving the posterior apical segment of the left upper lobe most compatible with pneumonia. Right apical nodular and ground-glass density is also noted to a lesser degree. There is mild diffuse interstitial prominence which may represent mild edema. There is moderate left and small right pleural effusions. No pneumothorax. The central airways are patent.  There is minimal atherosclerotic calcification of the thoracic aorta.  There is apparent peripheral filling defect in the left pulmonary artery at the bifurcation to the upper and lower lobe (series 5 image 118 - 131). This may represent pulmonary artery embolus or mass effect and compression or invasion by a centrally located left hilar mass. An ill-defined 2.0  x 1.5 cm density in the left hilar region (series 5, image 107) may represent combination of adenopathy and consolidated lung or a centrally located/partially obstructing mass. Follow-up with CT is recommended to ensure resolution of the left lung opacity and exclude an underlying hilar mass. No other central pulmonary artery embolus identified. Evaluation of the distal branches of the pulmonary arteries is limited due to respiratory motion artifact and suboptimal visualization.  There is no cardiomegaly or pericardial effusion. Left hilar adenopathy. The esophagus is grossly unremarkable with there is a small left thyroid hypodense nodule. Ultrasound may provide better evaluation.  There is no axillary adenopathy. There is diffuse subcutaneous soft tissue stranding and edema the chest wall no fluid collection. There is degenerative changes of the spine. No acute fracture.  There is retrograde flow of contrast from the right atrium into the IVC suggestive of a degree of right cardiac dysfunction. A 1.9 cm left renal upper pole hypodense lesion is not well characterized. This is stable compared to the CT dated 11/18/2013 and  likely represents a cyst.  Review of the MIP images confirms the above findings.  IMPRESSION: Left upper lobe pneumonia. An ill-defined centrally located left hilar density may represent combination of adenopathy and consolidated portion of the lung versus a hilar mass. Follow-up is recommended to document resolution of the airspace opacity and exclude underlying hilar mass.  Apparent peripheral filling defect in the left pulmonary artery at the bifurcation of the left upper and left lower lobe branches likely represent extrinsic compression on the vessel by the hilar adenopathy/ mass or invasion of a left hilar lesion into the adjacent pulmonary artery. Acute pulmonary embolus is less likely, given peripheral orientation, but not entirely excluded.  Interstitial edema and bilateral pleural effusions, left greater right.  Bilateral LE Venous Dopplers 6/29:  - Findings consistent with acute deep vein thrombosis involving the  right femoral vein, right popliteal vein, right posterior tibial  veins, and right peroneal veins. - No evidence of deep vein or superficial thrombosis involving the  left lower extremity and right common femoral vein. - No evidence of Baker&'s cyst on the right or left.  Palma Holter, MD 09/30/2015, 6:17 AM PGY-1, [facility] FPTS Intern pager: [phone], text pages welcome

## 2015-10-01 LAB — BASIC METABOLIC PANEL
Anion gap: 9 (ref 5–15)
BUN: 16 mg/dL (ref 6–20)
CHLORIDE: 97 mmol/L — AB (ref 101–111)
CO2: 29 mmol/L (ref 22–32)
CREATININE: 0.77 mg/dL (ref 0.61–1.24)
Calcium: 8.3 mg/dL — ABNORMAL LOW (ref 8.9–10.3)
GFR calc non Af Amer: >60 mL/min (ref >60)
Glucose, Bld: 175 mg/dL — ABNORMAL HIGH (ref 65–99)
POTASSIUM: 3.2 mmol/L — AB (ref 3.5–5.1)
SODIUM: 135 mmol/L (ref 135–145)

## 2015-10-01 LAB — CBC
HCT: 46.8 % (ref 39.0–52.0)
HEMOGLOBIN: 15.3 g/dL (ref 13.0–17.0)
MCH: 31 pg (ref 26.0–34.0)
MCHC: 32.7 g/dL (ref 30.0–36.0)
MCV: 94.9 fL (ref 78.0–100.0)
Platelets: 274 10*3/uL (ref 150–400)
RBC: 4.93 MIL/uL (ref 4.22–5.81)
RDW: 14.5 % (ref 11.5–15.5)
WBC: 13.1 10*3/uL — AB (ref 4.0–10.5)

## 2015-10-01 LAB — GLUCOSE, CAPILLARY
GLUCOSE-CAPILLARY: 240 mg/dL — AB (ref 65–99)
GLUCOSE-CAPILLARY: 208 mg/dL — AB (ref 65–99)
Glucose-Capillary: 334 mg/dL — ABNORMAL HIGH (ref 65–99)
Glucose-Capillary: 151 mg/dL — ABNORMAL HIGH (ref 65–99)

## 2015-10-01 LAB — HEPARIN LEVEL (UNFRACTIONATED): HEPARIN UNFRACTIONATED: 0.3 [IU]/mL (ref 0.30–0.70)

## 2015-10-01 MED ORDER — POTASSIUM CHLORIDE CRYS ER 20 MEQ PO TBCR
30.0000 meq | EXTENDED_RELEASE_TABLET | Freq: Once | ORAL | Status: AC
  Administered 2015-10-01: 30 meq via ORAL
  Filled 2015-10-01: qty 1

## 2015-10-01 MED ORDER — POTASSIUM CHLORIDE CRYS ER 20 MEQ PO TBCR
20.0000 meq | EXTENDED_RELEASE_TABLET | Freq: Every day | ORAL | Status: DC
  Administered 2015-10-02 – 2015-10-07 (×6): 20 meq via ORAL
  Filled 2015-10-01 (×6): qty 1

## 2015-10-01 MED ORDER — DIPHENHYDRAMINE HCL 25 MG PO CAPS
25.0000 mg | ORAL_CAPSULE | Freq: Once | ORAL | Status: AC
  Administered 2015-10-01: 25 mg via ORAL
  Filled 2015-10-01: qty 1

## 2015-10-01 NOTE — Progress Notes (Signed)
Family Medicine Teaching Service Daily Progress Note Intern Pager: [phone]  Patient name: Robert Hampton Medical record number: [id number] Date of birth: 1955/09/07 Age: 60 y.o. Gender: male  Primary Care Provider: No PCP Per Patient Consultants: Pulmonology Code Status: FULL  Pt Overview and Major Events to Date:  6/28: Admit for SOB: CHF, PNA, DVT 6/30: Increased Lasix to TID  Assessment and Plan: Robert Hampton is a 60 y.o. male presenting with shortness of breath and cough with LE swelling and jaundice, concern for PNA and also possible new CHF and also found to have RLE DVT. PMH is significant for T2DM, tobacco abuse.  RLE DVT: Additionally unable to rule out PE on CTA chest. - Heparin per pharmacy  - Per cardiology, keep on heparin drip for know in anticipation of ischemic work up  - Will likely eventually transition to Coumadin in the setting of possible mass on CT chest   CAP: CTA chest with LUL PNA and L hilar density adenopathy vs consolidation vs hilar mass; peripheral filling defect in L pulm artery likely extrinsic compression by hilar adenopathy/mass or invasion of L hilar lesion into adjacent PA; shows some interstitial edema and bilateral pleural effusions L >R. Urine strep and Legionella neg. Leukocytosis continues to improve (20 >>15.4>13.1). Respiratory status improved as well, though still some wheezing after exertion. Maintaining sats in 90s on RA.  - Cont Levaquin 750mg  (6/30) for total 7 days of antibiotics (last dose 7/04)  - Blood cx: NGx3d   - Cardiac monitoring and continuous pulse ox  New Acute HFrEF with hepatic congestion:  BNP was 1788 in the ED. ECHO: with EF 30-35%, mildly dilated with global hypokinesis, RVSP 47mmHg, dilated IVC. Repeat BNP 1061.5 (7/01). UOP 4.45L yesterday, with neg output -9.3 since admission. Weight: 236lb >228lb. Creatinine stable at 0.77 (Baseline ~ 0.8)  - Cardiology consulted, appreciate recs: HF unclear in etiology.  - IV  Lasix 40mg  TID - Lisinopril 5mg  daily (increased 6/30) - Coreg 6.25 BID (increased 7/1) - Spironolactone 12.5mg  daily  - Plan for Lexiscan, hopefully tomorrow, per cards  Elevated Liver Enzymes / Bilirubin / Jaundice 2/2 hepatic congestion, improving: Due to hepatic congestion in the setting of acute CHF exacerbation. No abdominal pain. AST 135 >> 35, ALT 525 >>178. Total bilirubin 1.9 >> 1.2. Drinks 2-3 glasses of wine per day. Abdominal US unremarkable. Hepatitis panel negative. TSH (nml), Ammonia (32), PT/INR (22/1.93).  - Continue to monitor  Possible Left Hilar Mass on Imaging: Pulmonology consulted: will need follow up CT imaging with contrast if possible in 4-6 weeks to evaluate left hilar mass; f/u outpatient in 4-6 weeks.   Hyponatremia, resolved: Na 128 > 135. Likely secondary to fluid overloaded and/or due to alcohol use.  - IV Lasix - Continue to monitor  Type 2 DM: Diagnosed 1 week ago. A1c 10.4. CBG 240 this AM.  - Continue Lantus 7U - Moderate SSI - CBGs with meals and at bedtime  Hypertension: BP 113-137/68-97 - Lisinopril 5mg  - Coreg 6.25mg  BID - Spironolactone 12.5mg  daily  - Hydralazine prn  Tobacco abuse: 3/4 PPD for 45 years - Declines Nicotine patch at this time.  Alcohol use: Drinks 2-3 glasses of wine per day. CIWA scores 0. - Continue to monitor  FEN/GI: Heart healthy diet, Miralax prn, SLIV Prophylaxis: Heparin drip  Disposition: pending medical improvement.   Subjective:  Patient reports no change from yesterday. Is anxious for cardiac workup to begin. Denies pain in RLE. Endorses dyspnea on exertion but no respiratory complaints  otherwise.   Objective: Temp:  [98 F (36.7 C)-99.7 F (37.6 C)] 98 F (36.7 C) (07/02 1127) Pulse Rate:  [87-92] 91 (07/02 1127) Resp:  [18-148] 18 (07/02 1127) BP: (113-137)/(68-97) 134/85 mmHg (07/02 1127) SpO2:  [93 %-99 %] 98 % (07/02 1127) Weight:  [228 lb 6.4 oz (103.602 kg)] 228 lb 6.4 oz (103.602 kg)  (07/02 0657) Physical Exam: General: Lying in bed in NAD Neck: No JVD Cardiovascular: RRR, no murmurs appreciated Respiratory: Increased WOB with exertion but able to speak in full sentences without dyspnea at rest. Crackles bilaterally with some wheezing after exertion.   Abdomen: Soft, NT, distended Extremities: 2+ pitting edema to knee bilaterally lower extremity edema with R >L. No calf warmth, erythema, or TTP bilaterally.   Laboratory:  Recent Labs Lab 09/29/15 0222 09/30/15 0508 10/01/15 0345  WBC 18.3* 15.4* 13.1*  HGB 15.4 15.4 15.3  HCT 46.5 48.0 46.8  PLT 240 270 274    Recent Labs Lab 09/28/15 0219 09/29/15 0222 09/30/15 0508 10/01/15 0345  NA 128* 132* 132* 135  K 4.3 4.2 4.1 3.2*  CL 98* 94* 93* 97*  CO2 BUN CREATININE 0.79 0.86 0.88 0.77  CALCIUM 8.2* 8.4* 8.4* 8.3*  PROT 5.4* 5.3* 5.0*  --   BILITOT 1.5* 1.9* 1.2  --   ALKPHOS 149* 123 108  --   ALT 424* 284* 178*  --   AST 104* 61* 35  --   GLUCOSE 174* 160* 208* 175*  Troponin: 0.07 > 0.06 BNP 1788  Imaging/Diagnostic Tests: US Abdomen:  1. Mild gallbladder wall thickening and a tiny amount of pericholecystic fluid identified. No stones, sludge, or Murphy's sign. If there is concern for acute cholecystitis, a HIDA scan could better evaluate. 2. Bilateral renal cysts.  ECHO 6/29:  LVEF 30-35%, mildly dilated with global hypokinesis, mild MR, normal biatrial size, moderate TR, RVSP 47 mmHg, dilated IVC, no pericardial effusion.  CTA Chest: 6/28: FINDINGS: Evaluation of this exam is limited due to respiratory motion artifact.  There is an area of ground-glass on nodular airspace opacity involving the posterior apical segment of the left upper lobe most compatible with pneumonia. Right apical nodular and ground-glass density is also noted to a lesser degree. There is mild diffuse interstitial prominence which may represent mild edema. There is moderate left  and small right pleural effusions. No pneumothorax. The central airways are patent.  There is minimal atherosclerotic calcification of the thoracic aorta.  There is apparent peripheral filling defect in the left pulmonary artery at the bifurcation to the upper and lower lobe (series 5 image 118 - 131). This may represent pulmonary artery embolus or mass effect and compression or invasion by a centrally located left hilar mass. An ill-defined 2.0 x 1.5 cm density in the left hilar region (series 5, image 107) may represent combination of adenopathy and consolidated lung or a centrally located/partially obstructing mass. Follow-up with CT is recommended to ensure resolution of the left lung opacity and exclude an underlying hilar mass. No other central pulmonary artery embolus identified. Evaluation of the distal branches of the pulmonary arteries is limited due to respiratory motion artifact and suboptimal visualization.  There is no cardiomegaly or pericardial effusion. Left hilar adenopathy. The esophagus is grossly unremarkable with there is a small left thyroid hypodense nodule. Ultrasound may provide better evaluation.  There is no axillary adenopathy. There is diffuse subcutaneous soft tissue stranding and edema the chest  wall no fluid collection. There is degenerative changes of the spine. No acute fracture.  There is retrograde flow of contrast from the right atrium into the IVC suggestive of a degree of right cardiac dysfunction. A 1.9 cm left renal upper pole hypodense lesion is not well characterized. This is stable compared to the CT dated 11/18/2013 and likely represents a cyst.  Review of the MIP images confirms the above findings.  IMPRESSION: Left upper lobe pneumonia. An ill-defined centrally located left hilar density may represent combination of adenopathy and consolidated portion of the lung versus a hilar mass. Follow-up is recommended to document  resolution of the airspace opacity and exclude underlying hilar mass.  Apparent peripheral filling defect in the left pulmonary artery at the bifurcation of the left upper and left lower lobe branches likely represent extrinsic compression on the vessel by the hilar adenopathy/ mass or invasion of a left hilar lesion into the adjacent pulmonary artery. Acute pulmonary embolus is less likely, given peripheral orientation, but not entirely excluded.  Interstitial edema and bilateral pleural effusions, left greater right.  Bilateral LE Venous Dopplers 6/29:  - Findings consistent with acute deep vein thrombosis involving the  right femoral vein, right popliteal vein, right posterior tibial  veins, and right peroneal veins. - No evidence of deep vein or superficial thrombosis involving the  left lower extremity and right common femoral vein. - No evidence of Baker&'s cyst on the right or left.  Marquette SaaAbigail Joseph Lus Kriegel, MD 10/01/2015, 11:54 AM PGY-2, [facility] [facility] FPTS Intern pager: [phone], text pages welcome

## 2015-10-01 NOTE — Progress Notes (Addendum)
Subjective:  Additional 3L negative overnight, now 9L negative. Breathing better. Creatinine stable. BNP 1061. Leukocytosis is improving.  Objective:  Vital Signs in the last 24 hours: Temp:  [98.6 F (37 C)-99.7 F (37.6 C)] 98.7 F (37.1 C) (07/02 0657) Pulse Rate:  [87-92] 92 (07/02 0657) Resp:  [18-148] 20 (07/02 0657) BP: (120-137)/(75-97) 137/97 mmHg (07/02 0657) SpO2:  [93 %-99 %] 95 % (07/02 0657) Weight:  [228 lb 6.4 oz (103.602 kg)] 228 lb 6.4 oz (103.602 kg) (07/02 0657)  Intake/Output from previous day:  Intake/Output Summary (Last 24 hours) at 10/01/15 0943 Last data filed at 10/01/15 0900  Gross per 24 hour  Intake   1740 ml  Output   4550 ml  Net  -2810 ml    Physical Exam: General appearance: alert, cooperative, no distress and moderately obese Neck: no carotid bruit and no JVD Lungs: decreased at both bases Heart: regular rate and rhythm Extremities: 1+ RLE edema Skin: pale, cool, dry Neurologic: Grossly normal   Rate: 96  Rhythm: normal sinus rhythm and premature ventricular contractions (PVC)  Lab Results:  Recent Labs  09/30/15 0508 10/01/15 0345  WBC 15.4* 13.1*  HGB 15.4 15.3  PLT 270 274    Recent Labs  09/30/15 0508 10/01/15 0345  NA 132* 135  K 4.1 3.2*  CL 93* 97*  CO2 29 29  GLUCOSE 208* 175*  BUN 14 16  CREATININE 0.88 0.77    Recent Labs  09/28/15 2021 09/29/15 0222  TROPONINI 0.12* 0.08*   No results for input(s): INR in the last 72 hours. BNP (last 3 results)  Recent Labs  09/27/15 1622 09/30/15 0514  BNP 1788.2* 1061.5*    ProBNP (last 3 results) No results for input(s): PROBNP in the last 8760 hours.  Scheduled Meds: . aspirin EC  81 mg Oral Daily  . carvedilol  3.125 mg Oral BID WC  . folic acid  1 mg Oral Daily  . furosemide  40 mg Intravenous TID  . insulin aspart  0-15 Units Subcutaneous TID WC  . insulin aspart  0-5 Units Subcutaneous QHS  . insulin glargine  7 Units Subcutaneous Daily    . levofloxacin  750 mg Oral QAC lunch  . lisinopril  5 mg Oral Daily  . multivitamin with minerals  1 tablet Oral Daily  . sodium chloride flush  3 mL Intravenous Q12H  . sodium chloride flush  3 mL Intravenous Q12H  . spironolactone  12.5 mg Oral Daily  . thiamine  100 mg Oral Daily   Or  . thiamine  100 mg Intravenous Daily   Continuous Infusions: . heparin 2,500 Units/hr (09/30/15 0443)   PRN Meds:.sodium chloride, benzonatate, hydrALAZINE, LORazepam **OR** LORazepam, ondansetron **OR** ondansetron (ZOFRAN) IV, polyethylene glycol, sodium chloride flush   Imaging: No results found.  Cardiac Studies: Echo 09/28/15 Impressions:  - LVEF 30-35%, mildly dilated with global hypokinesis, mild MR,  normal biatrial size, moderate TR, RVSP 47 mmHg, dilated IVC, no  pericardial effusion.  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 He admits to several week history of increasing shortness of breath and right lower extremity discomfort and swelling. He was admitted 09/27/15 with acute systolic heart failure. He has a right lower extremity DVT and possible mass vs adenopathy from his pneumonia in his lung for which follow-up CT scan is planned. He is on heparin anticoagulation for his DVT. His BNP was significantly elevated at  [id number] consistent with acute heart failure. He is a diabetic with glucose over 300 and greater than 1000 mg of urinary glucose on admission. He has sinus tachycardia undoubtedly contributed by his CHF and volume overload. He has diuresed 9L since admission. Echo shows an EF of 30-35% with global HK.   Principal Problem:   Acute respiratory failure with hypoxia (HCC) Active Problems:   Uncontrolled type 2 DM- on oral agent    Acute systolic CHF    Anasarca   Elevated liver enzymes   CAP (community acquired pneumonia)-LUL   Cardiomyopathy- etiology not yet determined- appears to be NICM   ETOH  abuse-daily drinker   Smoker-quit "9 days ago"   DVT-Rt lower extremity (HCC)   Pulmonary hypertension (HCC)   Abnormal CT scan of lung   PLAN:  Continue IV diuretics today. Now able to lie flat. Will plan lexiscan myoview tomorrow. Please keep NPO p MN. If myoview is negative for ischemia tomorrow, heparin can then be discontinued.  Chrystie NoseKenneth C. Hilty, MD, [facility] Inc[facility]ACC Attending Cardiologist [facility]  10/01/2015, 9:43 AM

## 2015-10-01 NOTE — Progress Notes (Signed)
ANTICOAGULATION CONSULT NOTE - Follow Up Consult  Pharmacy Consult for Heparin Indication: RLE DVT  Allergies  Allergen Reactions  . Bee Venom Anaphylaxis    Patient Measurements: Height: 6' (182.9 cm) Weight: 228 lb 6.4 oz (103.602 kg) IBW/kg (Calculated) : 77.6 Heparin Dosing Weight: 101 kg  Labs:  Recent Labs  09/28/15 1401  09/28/15 2021  09/29/15 0222  09/29/15 1644 09/30/15 0508 10/01/15 0345  HGB  --   --   --   < > 15.4  --   --  15.4 15.3  HCT  --   --   --   --  46.5  --   --  48.0 46.8  PLT  --   --   --   --  240  --   --  270 274  HEPARINUNFRC  --   < >  --   < >  --   < > 0.41 0.43 0.30  CREATININE  --   --   --   --  0.86  --   --  0.88 0.77  TROPONINI 0.07*  --  0.12*  --  0.08*  --   --   --   --   < > = values in this interval not displayed.  Estimated Creatinine Clearance: 125.3 mL/min (by C-G formula based on Cr of 0.77).  Assessment: 60 yo m on heparin for new DVT. Heparin levels have been therapeutic x 3 on 2500 units/hr. CBC stable, no issues per RN.    Goal of Therapy:  Heparin level 0.3-0.7 units/ml Monitor platelets by anticoagulation protocol: Yes   Plan:  - Continue heparin infusion at 2500 units/hr - Daily HL, CBC - F/u transition to warfarin/OAC  Cassie L. Roseanne RenoStewart, PharmD Clinical Pharmacist Pager: [phone] 10/01/2015 10:52 AM

## 2015-10-02 ENCOUNTER — Inpatient Hospital Stay (HOSPITAL_COMMUNITY): Payer: MEDICAID

## 2015-10-02 ENCOUNTER — Inpatient Hospital Stay (HOSPITAL_COMMUNITY): Payer: Self-pay

## 2015-10-02 DIAGNOSIS — R9439 Abnormal result of other cardiovascular function study: Secondary | ICD-10-CM | POA: Insufficient documentation

## 2015-10-02 DIAGNOSIS — R931 Abnormal findings on diagnostic imaging of heart and coronary circulation: Secondary | ICD-10-CM

## 2015-10-02 DIAGNOSIS — I429 Cardiomyopathy, unspecified: Secondary | ICD-10-CM

## 2015-10-02 DIAGNOSIS — I272 Other secondary pulmonary hypertension: Secondary | ICD-10-CM

## 2015-10-02 DIAGNOSIS — I5020 Unspecified systolic (congestive) heart failure: Secondary | ICD-10-CM

## 2015-10-02 LAB — GLUCOSE, CAPILLARY
GLUCOSE-CAPILLARY: 137 mg/dL — AB (ref 65–99)
GLUCOSE-CAPILLARY: 265 mg/dL — AB (ref 65–99)
GLUCOSE-CAPILLARY: 187 mg/dL — AB (ref 65–99)
Glucose-Capillary: 269 mg/dL — ABNORMAL HIGH (ref 65–99)

## 2015-10-02 LAB — NM MYOCAR MULTI W/SPECT W/WALL MOTION / EF
CHL CUP MPHR: 162 {beats}/min
CHL CUP NUCLEAR SDS: 2
CHL CUP NUCLEAR SRS: 15
CHL CUP NUCLEAR SSS: 15
CSEPHR: 59 %
Estimated workload: 1 METS
Exercise duration (min): 0 min
Exercise duration (sec): 0 s
LHR: 0.43
LV sys vol: 161 mL
LVDIAVOL: 207 mL (ref 62–150)
NUC STRESS TID: 1.14
Peak HR: 96 {beats}/min
Rest HR: 88 {beats}/min

## 2015-10-02 LAB — CULTURE, BLOOD (ROUTINE X 2)
CULTURE: NO GROWTH
Culture: NO GROWTH

## 2015-10-02 LAB — BASIC METABOLIC PANEL
Anion gap: 7 (ref 5–15)
BUN: 14 mg/dL (ref 6–20)
CHLORIDE: 95 mmol/L — AB (ref 101–111)
CO2: 32 mmol/L (ref 22–32)
CREATININE: 0.98 mg/dL (ref 0.61–1.24)
Calcium: 8.8 mg/dL — ABNORMAL LOW (ref 8.9–10.3)
GFR calc Af Amer: >60 mL/min (ref >60)
GLUCOSE: 135 mg/dL — AB (ref 65–99)
POTASSIUM: 4.3 mmol/L (ref 3.5–5.1)
SODIUM: 134 mmol/L — AB (ref 135–145)

## 2015-10-02 LAB — PROTIME-INR
INR: 1.47 (ref 0.00–1.49)
PROTHROMBIN TIME: 17.9 s — AB (ref 11.6–15.2)

## 2015-10-02 LAB — PROCALCITONIN: Procalcitonin: 0.1 ng/mL

## 2015-10-02 LAB — HEPARIN LEVEL (UNFRACTIONATED): HEPARIN UNFRACTIONATED: 0.46 [IU]/mL (ref 0.30–0.70)

## 2015-10-02 MED ORDER — CARVEDILOL 3.125 MG PO TABS
6.2500 mg | ORAL_TABLET | Freq: Two times a day (BID) | ORAL | Status: DC
  Administered 2015-10-02 – 2015-10-07 (×10): 6.25 mg via ORAL
  Filled 2015-10-02: qty 1
  Filled 2015-10-02: qty 2
  Filled 2015-10-02 (×6): qty 1
  Filled 2015-10-02: qty 2
  Filled 2015-10-02: qty 1

## 2015-10-02 MED ORDER — TRAZODONE HCL 50 MG PO TABS
50.0000 mg | ORAL_TABLET | Freq: Once | ORAL | Status: AC
  Administered 2015-10-02: 50 mg via ORAL
  Filled 2015-10-02: qty 1

## 2015-10-02 MED ORDER — TECHNETIUM TC 99M TETROFOSMIN IV KIT
30.0000 | PACK | Freq: Once | INTRAVENOUS | Status: AC | PRN
  Administered 2015-10-02: 30 via INTRAVENOUS

## 2015-10-02 MED ORDER — REGADENOSON 0.4 MG/5ML IV SOLN
INTRAVENOUS | Status: AC
  Administered 2015-10-02: 10:00:00
  Filled 2015-10-02: qty 5

## 2015-10-02 MED ORDER — TECHNETIUM TC 99M TETROFOSMIN IV KIT
10.0000 | PACK | Freq: Once | INTRAVENOUS | Status: AC | PRN
  Administered 2015-10-02: 10 via INTRAVENOUS

## 2015-10-02 MED ORDER — REGADENOSON 0.4 MG/5ML IV SOLN
0.4000 mg | Freq: Once | INTRAVENOUS | Status: AC
  Administered 2015-10-02: 0.4 mg via INTRAVENOUS
  Filled 2015-10-02: qty 5

## 2015-10-02 NOTE — Progress Notes (Signed)
Pt was seen 09/29/2015; please see CM note; CM will continue to follow for DCP; B Shelba Flakehandler RN,MHA,BSN [phone]

## 2015-10-02 NOTE — Progress Notes (Signed)
Family Medicine Teaching Service Daily Progress Note Intern Pager: [phone]  Patient name: Robert GeneraJonathan Martinezgarcia Medical record number: [id number] Date of birth: 01/07/1956 Age: 60 y.o. Gender: male  Primary Care Provider: No PCP Per Patient Consultants: Pulmonology Code Status: FULL   Pt Overview and Major Events to Date:  6/28: Admit for SOB: CHF, PNA, DVT 6/30: Increased Lasix to TID  Assessment and Plan: Robert Hampton is a 60 y.o. male presenting with shortness of breath and cough with LE swelling and jaundice, concern for PNA and also possible new CHF and also found to have RLE DVT. PMH is significant for T2DM, tobacco abuse.  RLE DVT: Additionally unable to rule out PE on CTA chest. - Heparin per pharmacy  - Per cardiology, keep on heparin drip for now in anticipation of ischemic work up  - Will likely eventually transition to Coumadin in the setting of possible mass on CT chest   CAP: CTA chest with LUL PNA and L hilar density adenopathy vs consolidation vs hilar mass; peripheral filling defect in L pulm artery likely extrinsic compression by hilar adenopathy/mass or invasion of L hilar lesion into adjacent PA; shows some interstitial edema and bilateral pleural effusions L >R. Urine strep and Legionella neg. Leukocytosis continues to improve (20 >>15.4>13.1). Respiratory status improved as well, though still some wheezing after exertion. Maintaining sats in 90s on RA.  - Cont Levaquin 750mg  (6/30) for total 7 days of antibiotics (last dose 7/04)  - Blood cx: NGx3d   - Cardiac monitoring and continuous pulse ox  New Acute HFrEF with hepatic congestion:  BNP was 1788 in the ED. ECHO: with EF 30-35%, mildly dilated with global hypokinesis, RVSP 47mmHg, dilated IVC. Repeat BNP 1061.5 (7/01). UOP 4.45L yesterday, with neg output -9.3 since admission. Weight: 236lb >228lb. Creatinine stable at 0.77 (Baseline ~ 0.8)  - Cardiology consulted, appreciate recs: HF unclear in etiology.  - IV  Lasix 40mg  TID - Lisinopril 5mg  daily (increased 6/30) - Coreg 6.2 BID (increased 7/3) - Spironolactone 12.5mg  daily  - [facility], per cards  Elevated Liver Enzymes / Bilirubin / Jaundice 2/2 hepatic congestion, improving: Due to hepatic congestion in the setting of acute CHF exacerbation. No abdominal pain. AST 135 >> 35, ALT 525 >>178. Total bilirubin 1.9 >> 1.2. Drinks 2-3 glasses of wine per day. Abdominal US unremarkable. Hepatitis panel negative. TSH (nml), Ammonia (32), PT/INR (22/1.93).  - Continue to monitor  Possible Left Hilar Mass on Imaging: Pulmonology consulted: will need follow up CT imaging with contrast if possible in 4-6 weeks to evaluate left hilar mass; f/u outpatient in 4-6 weeks.   Hyponatremia, resolved: Na 128 > 135. Likely secondary to fluid overloaded and/or due to alcohol use.  - IV Lasix - Continue to monitor  Type 2 DM: Diagnosed 1 week ago. A1c 10.4. CBG 137 this AM.  - Continue Lantus 7U - Moderate SSI - CBGs with meals and at bedtime  Hypertension: BP 113-137/68-97 - Lisinopril 5mg  - Coreg 6.2 mg BID - Spironolactone 12.5mg  daily  - Hydralazine prn  Tobacco abuse: 3/4 PPD for 45 years - Declines Nicotine patch at this time.  Alcohol use: Drinks 2-3 glasses of wine per day. CIWA scores 0. - Continue to monitor  FEN/GI: Heart healthy diet, Miralax prn, SLIV Prophylaxis: Heparin drip  Disposition: pending medical improvement.   Subjective:  Patient reports no change from yesterday. Reports continues to cough when laying flat at night but otherwise SOB is improved. States can ambulate  well with slight dyspnea on exertion. Denies R LE pain but states has continued b/l LE swelling that he feels is improving throughout hospital stay. Has no other questions or complaints.   Objective: Temp:  [97.5 F (36.4 C)-98.2 F (36.8 C)] 97.5 F (36.4 C) (07/03 0759) Pulse Rate:  [82-95] 95 (07/03 0759) Resp:  [18] 18 (07/03  0759) BP: (113-137)/(68-111) 137/111 mmHg (07/03 0759) SpO2:  [93 %-100 %] 100 % (07/03 0759) Weight:  [226 lb 3.2 oz (102.604 kg)] 226 lb 3.2 oz (102.604 kg) (07/03 1610)   Physical Exam: General: sitting in chair in NAD Neck: No JVD Cardiovascular: RRR, no murmurs appreciated Respiratory: Increased WOB with exertion but able to speak in full sentences without dyspnea at rest. Crackles bilaterally with some coughing and wheezing after exertion.   Abdomen: Soft, NT, distended Extremities: 2+ pitting edema to midshin bilaterally lower extremity edema with R > L. No calf warmth, erythema, or TTP bilaterally.   Laboratory:  Recent Labs Lab 09/29/15 0222 09/30/15 0508 10/01/15 0345  WBC 18.3* 15.4* 13.1*  HGB 15.4 15.4 15.3  HCT 46.5 48.0 46.8  PLT 240 270 274    Recent Labs Lab 09/28/15 0219 09/29/15 0222 09/30/15 0508 10/01/15 0345 10/02/15 0520  NA 128* 132* 132* 135 134*  K 4.3 4.2 4.1 3.2* 4.3  CL 98* 94* 93* 97* 95*  CO2 32  BUN CREATININE 0.79 0.86 0.88 0.77 0.98  CALCIUM 8.2* 8.4* 8.4* 8.3* 8.8*  PROT 5.4* 5.3* 5.0*  --   --   BILITOT 1.5* 1.9* 1.2  --   --   ALKPHOS 149* 123 108  --   --   ALT 424* 284* 178*  --   --   AST 104* 61* 35  --   --   GLUCOSE 174* 160* 208* 175* 135*  Troponin: 0.07 > 0.06 BNP 1788  Imaging/Diagnostic Tests: US Abdomen:  1. Mild gallbladder wall thickening and a tiny amount of pericholecystic fluid identified. No stones, sludge, or Murphy's sign. If there is concern for acute cholecystitis, a HIDA scan could better evaluate. 2. Bilateral renal cysts.  ECHO 6/29:  LVEF 30-35%, mildly dilated with global hypokinesis, mild MR, normal biatrial size, moderate TR, RVSP 47 mmHg, dilated IVC, no pericardial effusion.  CTA Chest: 6/28: FINDINGS: Evaluation of this exam is limited due to respiratory motion artifact.  There is an area of ground-glass on nodular airspace opacity involving the  posterior apical segment of the left upper lobe most compatible with pneumonia. Right apical nodular and ground-glass density is also noted to a lesser degree. There is mild diffuse interstitial prominence which may represent mild edema. There is moderate left and small right pleural effusions. No pneumothorax. The central airways are patent.  There is minimal atherosclerotic calcification of the thoracic aorta.  There is apparent peripheral filling defect in the left pulmonary artery at the bifurcation to the upper and lower lobe (series 5 image 118 - 131). This may represent pulmonary artery embolus or mass effect and compression or invasion by a centrally located left hilar mass. An ill-defined 2.0 x 1.5 cm density in the left hilar region (series 5, image 107) may represent combination of adenopathy and consolidated lung or a centrally located/partially obstructing mass. Follow-up with CT is recommended to ensure resolution of the left lung opacity and exclude an underlying hilar mass. No other central pulmonary artery embolus identified. Evaluation of the distal branches of  the pulmonary arteries is limited due to respiratory motion artifact and suboptimal visualization.  There is no cardiomegaly or pericardial effusion. Left hilar adenopathy. The esophagus is grossly unremarkable with there is a small left thyroid hypodense nodule. Ultrasound may provide better evaluation.  There is no axillary adenopathy. There is diffuse subcutaneous soft tissue stranding and edema the chest wall no fluid collection. There is degenerative changes of the spine. No acute fracture.  There is retrograde flow of contrast from the right atrium into the IVC suggestive of a degree of right cardiac dysfunction. A 1.9 cm left renal upper pole hypodense lesion is not well characterized. This is stable compared to the CT dated 11/18/2013 and likely represents a cyst.  Review of the MIP images  confirms the above findings.  IMPRESSION: Left upper lobe pneumonia. An ill-defined centrally located left hilar density may represent combination of adenopathy and consolidated portion of the lung versus a hilar mass. Follow-up is recommended to document resolution of the airspace opacity and exclude underlying hilar mass.  Apparent peripheral filling defect in the left pulmonary artery at the bifurcation of the left upper and left lower lobe branches likely represent extrinsic compression on the vessel by the hilar adenopathy/ mass or invasion of a left hilar lesion into the adjacent pulmonary artery. Acute pulmonary embolus is less likely, given peripheral orientation, but not entirely excluded.  Interstitial edema and bilateral pleural effusions, left greater right.  Bilateral LE Venous Dopplers 6/29: - Findings consistent with acute deep vein thrombosis involving the  right femoral vein, right popliteal vein, right posterior tibial  veins, and right peroneal veins. - No evidence of deep vein or superficial thrombosis involving the  left lower extremity and right common femoral vein. - No evidence of Baker's cyst on the right or left.  Leland HerElsia J Yoo, DO 10/02/2015, 6:33 AM PGY-1, [place] [facility] FPTS Intern pager: [phone], text pages welcome

## 2015-10-02 NOTE — Progress Notes (Signed)
Hospital Problem List     Principal Problem:   Acute respiratory failure with hypoxia (HCC) Active Problems:   Uncontrolled type 2 DM- on oral agent    Acute systolic CHF    Anasarca   Elevated liver enzymes   CAP (community acquired pneumonia)-LUL   Cardiomyopathy- etiology not yet determined- appears to be NICM   ETOH abuse-daily drinker   Smoker-quit "9 days ago"   DVT-Rt lower extremity (HCC)   Pulmonary hypertension (HCC)   Abnormal CT scan of lung     Patient Profile:   Primary Cardiologist: New - Dr. Tresa Endo  60 yo male with PMH of DM II and HTN who presented with 3 weeks onset of increasing SOB. He was diagnosed with LUL PNA, possible mass in the lung, acute HF, elevated transaminase, R leg DVT and Echo shows new LV dysfunction.   Subjective   Chest pain overnight and this AM, most notable with coughing. Seen in Nuclear Medicine for 1-day NST.  Inpatient Medications    . aspirin EC  81 mg Oral Daily  . carvedilol  6.25 mg Oral BID WC  . folic acid  1 mg Oral Daily  . furosemide  40 mg Intravenous TID  . insulin aspart  0-15 Units Subcutaneous TID WC  . insulin aspart  0-5 Units Subcutaneous QHS  . insulin glargine  7 Units Subcutaneous Daily  . lisinopril  5 mg Oral Daily  . multivitamin with minerals  1 tablet Oral Daily  . potassium chloride  20 mEq Oral Daily  . regadenoson      . regadenoson  0.4 mg Intravenous Once  . sodium chloride flush  3 mL Intravenous Q12H  . sodium chloride flush  3 mL Intravenous Q12H  . spironolactone  12.5 mg Oral Daily  . thiamine  100 mg Oral Daily   Or  . thiamine  100 mg Intravenous Daily    Vital Signs    Filed Vitals:   10/01/15 1937 10/02/15 0335 10/02/15 0512 10/02/15 0759  BP: 124/85 137/87  137/111  Pulse: 82 95  95  Temp: 98.2 F (36.8 C) 98.1 F (36.7 C)  97.5 F (36.4 C)  TempSrc: Oral Oral  Oral  Resp: Height:      Weight:   226 lb 3.2 oz (102.604 kg)   SpO2: 98% 96%  100%     Intake/Output Summary (Last 24 hours) at 10/02/15 1019 Last data filed at 10/02/15 0916  Gross per 24 hour  Intake   1040 ml  Output   4150 ml  Net  -3110 ml   Filed Weights   09/30/15 0531 10/01/15 0657 10/02/15 0512  Weight: 236 lb 4.8 oz (107.185 kg) 228 lb 6.4 oz (103.602 kg) 226 lb 3.2 oz (102.604 kg)    Physical Exam    General: Well developed, well nourished, male appearing in no acute distress. Head: Normocephalic, atraumatic.  Neck: Supple without bruits, JVD not elevated. Lungs:  Resp regular and unlabored, decreased breath sounds at bases bilaterally. Heart: RRR, S1, S2, no S3, S4, or murmur; no rub. Abdomen: Soft, non-tender, non-distended with normoactive bowel sounds. No hepatomegaly. No rebound/guarding. No obvious abdominal masses. Extremities: No clubbing, cyanosis, 1+ edema along RLE. Distal pedal pulses are 2+ bilaterally. Neuro: Alert and oriented X 3. Moves all extremities spontaneously. Psych: Normal affect.  Labs    CBC  Recent Labs  09/30/15 0508 10/01/15 0345  WBC 15.4* 13.1*  HGB 15.4 15.3  HCT 48.0 46.8  MCV 97.2 94.9  PLT 270 274   Basic Metabolic Panel  Recent Labs  10/01/15 0345 10/02/15 0520  NA 135 134*  K 3.2* 4.3  CL 97* 95*  CO2 29 32  GLUCOSE 175* 135*  BUN 16 14  CREATININE 0.77 0.98  CALCIUM 8.3* 8.8*   Liver Function Tests  Recent Labs  09/30/15 0508  AST 35  ALT 178*  ALKPHOS 108  BILITOT 1.2  PROT 5.0*  ALBUMIN 2.0*    Telemetry    Not reviewed, seen in Nuclear Medicine   Cardiac Studies and Radiology    Dg Chest 2 View  09/27/2015  CLINICAL DATA:  Cough and shortness of breath, progressing EXAM: CHEST  2 VIEW COMPARISON:  September 19, 2015 FINDINGS: There is now airspace consolidation throughout the posterior segment of the left upper lobe. There is slight atelectasis in the left base. The right lung is clear. Heart size and pulmonary vascularity are within normal limits. No adenopathy. There is a  benign exostosis arising from the inferior anterior right first rib. There is degenerative change in the thoracic spine. IMPRESSION: Airspace consolidation throughout the posterior segment of the left upper lobe consistent with pneumonia. Mild left base atelectasis. Right lung clear. Stable cardiac silhouette. These results will be called to the ordering clinician or representative by the Radiologist Assistant, and communication documented in the PACS or zVision Dashboard. Electronically Signed   By: Bretta BangWilliam  Woodruff III M.D.   On: 09/27/2015 14:35   Dg Chest 2 View  09/19/2015  CLINICAL DATA:  Two days of dyspnea, shortness of breath, and fatigue EXAM: CHEST  2 VIEW COMPARISON:  None in PACs FINDINGS: The lungs are adequately inflated. The interstitial markings are increased bilaterally. Coarse lung markings in the infrahilar regions bilaterally are noted as well. The heart is top-normal in size. The pulmonary vascularity is normal. There is multilevel degenerative disc disease of the thoracic spine with calcification of portions of the anterior longitudinal ligament. There is calcification associated with the first costosternal junction. IMPRESSION: Probable chronic bronchitis with superimposed interstitial pneumonia. There is no alveolar infiltrate nor CHF. Followup PA and lateral chest X-ray is recommended in 3-4 weeks following trial of antibiotic therapy to ensure resolution and exclude underlying malignancy. Electronically Signed   By: David  SwazilandJordan M.D.   On: 09/19/2015 16:19   Ct Angio Chest Pe W Or Wo Contrast  09/28/2015  CLINICAL DATA:  60 year old male with shortness of breath. EXAM: CT ANGIOGRAPHY CHEST WITH CONTRAST TECHNIQUE: Multidetector CT imaging of the chest was performed using the standard protocol during bolus administration of intravenous contrast. Multiplanar CT image reconstructions and MIPs were obtained to evaluate the vascular anatomy. CONTRAST:  100 cc Isovue 370 COMPARISON:   Chest radiograph dated 09/27/2015 FINDINGS: Evaluation of this exam is limited due to respiratory motion artifact. There is an area of ground-glass on nodular airspace opacity involving the posterior apical segment of the left upper lobe most compatible with pneumonia. Right apical nodular and ground-glass density is also noted to a lesser degree. There is mild diffuse interstitial prominence which may represent mild edema. There is moderate left and small right pleural effusions. No pneumothorax. The central airways are patent. There is minimal atherosclerotic calcification of the thoracic aorta. There is apparent peripheral filling defect in the left pulmonary artery at the bifurcation to the upper and lower lobe (series 5 image 118 - 131). This may represent pulmonary artery embolus or mass effect and compression or  invasion by a centrally located left hilar mass. An ill-defined 2.0 x 1.5 cm density in the left hilar region (series 5, image 107) may represent combination of adenopathy and consolidated lung or a centrally located/partially obstructing mass. Follow-up with CT is recommended to ensure resolution of the left lung opacity and exclude an underlying hilar mass. No other central pulmonary artery embolus identified. Evaluation of the distal branches of the pulmonary arteries is limited due to respiratory motion artifact and suboptimal visualization. There is no cardiomegaly or pericardial effusion. Left hilar adenopathy. The esophagus is grossly unremarkable with there is a small left thyroid hypodense nodule. Ultrasound may provide better evaluation. There is no axillary adenopathy. There is diffuse subcutaneous soft tissue stranding and edema the chest wall no fluid collection. There is degenerative changes of the spine. No acute fracture. There is retrograde flow of contrast from the right atrium into the IVC suggestive of a degree of right cardiac dysfunction. A 1.9 cm left renal upper pole hypodense  lesion is not well characterized. This is stable compared to the CT dated 11/18/2013 and likely represents a cyst. Review of the MIP images confirms the above findings. IMPRESSION: Left upper lobe pneumonia. An ill-defined centrally located left hilar density may represent combination of adenopathy and consolidated portion of the lung versus a hilar mass. Follow-up is recommended to document resolution of the airspace opacity and exclude underlying hilar mass. Apparent peripheral filling defect in the left pulmonary artery at the bifurcation of the left upper and left lower lobe branches likely represent extrinsic compression on the vessel by the hilar adenopathy/ mass or invasion of a left hilar lesion into the adjacent pulmonary artery. Acute pulmonary embolus is less likely, given peripheral orientation, but not entirely excluded. Interstitial edema and bilateral pleural effusions, left greater right. These results were called by telephone at the time of interpretation on 09/28/2015 at 12:52 am to Dr. Wende MottMcKeag, who verbally acknowledged these results. Electronically Signed   By: Elgie CollardArash  Radparvar M.D.   On: 09/28/2015 00:56   [place] Abdomen Complete  09/28/2015  CLINICAL DATA:  Transaminitis EXAM: ABDOMEN ULTRASOUND COMPLETE COMPARISON:  CT scan November 18, 2013 FINDINGS: Gallbladder: The gallbladder wall is mildly thickened measuring 5 mm. There is slight pericholecystic fluid. No stones, sludge, or Murphy's sign. Common bile duct: Diameter: 3 mm Liver: No focal lesion identified. Within normal limits in parenchymal echogenicity. IVC: No abnormality visualized. Pancreas: The pancreas was not visualized. Spleen: Size and appearance within normal limits. Right Kidney: Length: 13.4 cm. Known renal cysts. The largest measures 2.3 cm, not changed since August 2015. Left Kidney: Length: 12.9 cm.  Known renal cysts. Abdominal aorta: No aneurysm visualized. Other findings: None. IMPRESSION: 1. Mild gallbladder wall thickening  and a tiny amount of pericholecystic fluid identified. No stones, sludge, or Murphy's sign. If there is concern for acute cholecystitis, a HIDA scan could better evaluate. 2. Bilateral renal cysts. Electronically Signed   By: Gerome Samavid  Williams III M.D   On: 09/28/2015 18:02   Assessment & Plan    1. Acute systolic HF with newly diagnosed LV dysfunction - Echo this admission shows an EF of 30-35% with diffuse HK.  - unclear cause, differential diagnosis include underlying CAD vs stress induced cardiomyopathy vs alcohol induced (although abdominal U/S shows no cirrhosis). He denies any exertional symptoms prior to 3 weeks ago, since then, he only has CP with coughing which does not sound cardiac in nature.  - currently on IV Lasix 40mg  TID. Net output  of -12.0L. Can hopefully switch to PO diuretics tomorrow. - continue Coreg, Lisinopril, and Spironolactone.  - seen in Nuclear Medicine for 1-day NST. Official results pending following stress images.  2. Mass in the lung - per PCCM, plan for followup CT in several month  3. RLE DVT - hold off on initiating oral anticoagulation until after ischemic workup. - Continue IV heparin  4. CAP - per admitting team  Signed, Ellsworth Lennox , PA-C 10:19 AM 10/02/2015   Agree with note by Reita May  Pt admitted with CHF. EF 30%. Diuresed. In addition he has extensive RLE  DVT on IV hep. Will ultimately require NOAC. MV shows large IL scar with peri - infarct ischemia.Will need R/L heart cath on Wed. In addition, there is a hilar mass which will need to be evaluated. Should repeat chest CT and get pulm involved.  Runell Gess, M.D., FACP, [facility], [facility] [address] [place], [place]  [address]  [phone] 10/02/2015 5:55 PM

## 2015-10-02 NOTE — Progress Notes (Signed)
ANTICOAGULATION CONSULT NOTE - Follow Up Consult  Pharmacy Consult for Heparin Indication: RLE DVT  Allergies  Allergen Reactions  . Bee Venom Anaphylaxis    Patient Measurements: Height: 6' (182.9 cm) Weight: 226 lb 3.2 oz (102.604 kg) IBW/kg (Calculated) : 77.6 Heparin Dosing Weight: 101 kg  Labs:  Recent Labs  09/30/15 0508 10/01/15 0345 10/02/15 0520  HGB 15.4 15.3  --   HCT 48.0 46.8  --   PLT 270 274  --   HEPARINUNFRC 0.43 0.30 0.46  CREATININE 0.88 0.77 0.98    Estimated Creatinine Clearance: 101.8 mL/min (by C-G formula based on Cr of 0.98).  Assessment: 60 yo m on heparin for new DVT. Heparin level remains therapeutic this morning at 0.46 on heparin infusion of 2500 units/hr. CBC from 7/52 is stable with no sxs of bleeding. Myoview planned for today.   Goal of Therapy:  Heparin level 0.3-0.7 units/ml Monitor platelets by anticoagulation protocol: Yes   Plan:  - Continue heparin infusion at 2500 units/hr - Daily HL, CBC - F/u transition to warfarin/OAC post Donzetta Mattersmyoview   Andy Danial Hlavac, PharmD, BCPS 10/02/2015, 7:51 AM Pager: [phone]

## 2015-10-03 ENCOUNTER — Inpatient Hospital Stay (HOSPITAL_COMMUNITY): Payer: MEDICAID

## 2015-10-03 DIAGNOSIS — I509 Heart failure, unspecified: Secondary | ICD-10-CM | POA: Insufficient documentation

## 2015-10-03 DIAGNOSIS — J189 Pneumonia, unspecified organism: Secondary | ICD-10-CM | POA: Insufficient documentation

## 2015-10-03 LAB — GLUCOSE, CAPILLARY
GLUCOSE-CAPILLARY: 287 mg/dL — AB (ref 65–99)
GLUCOSE-CAPILLARY: 234 mg/dL — AB (ref 65–99)
Glucose-Capillary: 177 mg/dL — ABNORMAL HIGH (ref 65–99)
Glucose-Capillary: 225 mg/dL — ABNORMAL HIGH (ref 65–99)

## 2015-10-03 LAB — HEPARIN LEVEL (UNFRACTIONATED): Heparin Unfractionated: 0.38 IU/mL (ref 0.30–0.70)

## 2015-10-03 LAB — PROTIME-INR
INR: 1.48 (ref 0.00–1.49)
Prothrombin Time: 18 seconds — ABNORMAL HIGH (ref 11.6–15.2)

## 2015-10-03 LAB — BASIC METABOLIC PANEL
Anion gap: 10 (ref 5–15)
BUN: 19 mg/dL (ref 6–20)
CALCIUM: 8.4 mg/dL — AB (ref 8.9–10.3)
CO2: 28 mmol/L (ref 22–32)
CREATININE: 0.8 mg/dL (ref 0.61–1.24)
Chloride: 95 mmol/L — ABNORMAL LOW (ref 101–111)
GFR calc non Af Amer: >60 mL/min (ref >60)
Glucose, Bld: 179 mg/dL — ABNORMAL HIGH (ref 65–99)
Potassium: 3.7 mmol/L (ref 3.5–5.1)
Sodium: 133 mmol/L — ABNORMAL LOW (ref 135–145)

## 2015-10-03 LAB — FERRITIN: FERRITIN: 252 ng/mL (ref 24–336)

## 2015-10-03 MED ORDER — ASPIRIN 81 MG PO CHEW
81.0000 mg | CHEWABLE_TABLET | ORAL | Status: AC
  Administered 2015-10-04: 81 mg via ORAL
  Filled 2015-10-03: qty 1

## 2015-10-03 MED ORDER — SODIUM CHLORIDE 0.9% FLUSH: 3.0000 mL | INTRAVENOUS | Status: DC | PRN

## 2015-10-03 MED ORDER — SODIUM CHLORIDE 0.9% FLUSH
3.0000 mL | Freq: Two times a day (BID) | INTRAVENOUS | Status: DC
  Administered 2015-10-04: 3 mL via INTRAVENOUS

## 2015-10-03 MED ORDER — SODIUM CHLORIDE 0.9 % WEIGHT BASED INFUSION
3.0000 mL/kg/h | INTRAVENOUS | Status: DC
  Administered 2015-10-04: 3 mL/kg/h via INTRAVENOUS

## 2015-10-03 MED ORDER — SODIUM CHLORIDE 0.9 % IV SOLN: 250.0000 mL | INTRAVENOUS | Status: DC | PRN

## 2015-10-03 MED ORDER — SODIUM CHLORIDE 0.9 % WEIGHT BASED INFUSION
1.0000 mL/kg/h | INTRAVENOUS | Status: DC
  Administered 2015-10-04: 1 mL/kg/h via INTRAVENOUS

## 2015-10-03 NOTE — Progress Notes (Signed)
Primary cardiologist: Dr. Nicki Guadalajara  Seen for followup: Cardiomyopathy  Subjective:    Intermittent cough, no chest pain. Appetite fair. No orthopnea.  Objective:   Temp:  [98.4 F (36.9 C)-98.6 F (37 C)] 98.6 F (37 C) (07/04 0514) Pulse Rate:  [88-94] 89 (07/04 0514) Resp:  [18] 18 (07/04 0514) BP: (120-144)/(83-93) 142/87 mmHg (07/04 0514) SpO2:  [97 %-100 %] 98 % (07/04 0514) Weight:  [220 lb 9.6 oz (100.064 kg)] 220 lb 9.6 oz (100.064 kg) (07/04 0514) Last BM Date: 10/02/15  Filed Weights   10/01/15 0657 10/02/15 0512 10/03/15 0514  Weight: 228 lb 6.4 oz (103.602 kg) 226 lb 3.2 oz (102.604 kg) 220 lb 9.6 oz (100.064 kg)    Intake/Output Summary (Last 24 hours) at 10/03/15 1610 Last data filed at 10/03/15 0600  Gross per 24 hour  Intake    960 ml  Output   3725 ml  Net  -2765 ml    Telemetry: Sinus rhythm.  Exam:  General: No distress.  Lungs: Coarse breath sounds, no wheezes.  Cardiac: RRR, no S3.  Abdomen: NABS.  Extremities: Mild right leg edema.  Lab Results:  Basic Metabolic Panel:  Recent Labs Lab 10/01/15 0345 10/02/15 0520 10/03/15 0216  NA 135 134* 133*  K 3.2* 4.3 3.7  CL 97* 95* 95*  CO2 29 32 28  GLUCOSE 175* 135* 179*  BUN CREATININE 0.77 0.98 0.80  CALCIUM 8.3* 8.8* 8.4*    Liver Function Tests:  Recent Labs Lab 09/28/15 0219 09/29/15 0222 09/30/15 0508  AST 104* 61* 35  ALT 424* 284* 178*  ALKPHOS 149* 123 108  BILITOT 1.5* 1.9* 1.2  PROT 5.4* 5.3* 5.0*  ALBUMIN 2.6* 2.3* 2.0*    CBC:  Recent Labs Lab 09/29/15 0222 09/30/15 0508 10/01/15 0345  WBC 18.3* 15.4* 13.1*  HGB 15.4 15.4 15.3  HCT 46.5 48.0 46.8  MCV 95.1 97.2 94.9  PLT 240 270 274    Cardiac Enzymes:  Recent Labs Lab 09/28/15 1401 09/28/15 2021 09/29/15 0222  TROPONINI 0.07* 0.12* 0.08*    Coagulation:  Recent Labs Lab 09/28/15 0219 10/02/15 0828 10/03/15 0216  INR 1.93* 1.47 1.48    Echocardiogram  09/28/2015: Study Conclusions  - Left ventricle: The cavity size was mildly dilated. Wall  thickness was normal. Systolic function was moderately to  severely reduced. The estimated ejection fraction was in the  range of 30% to 35%. Diffuse hypokinesis. The study is not  technically sufficient to allow evaluation of LV diastolic  function. - Mitral valve: Mildly thickened leaflets . There was mild  regurgitation. - Left atrium: The atrium was normal in size. - Right ventricle: Poorly visualized. - Right atrium: The atrium was normal in size. - Tricuspid valve: There was moderate regurgitation. - Pulmonary arteries: PA peak pressure: 47 mm Hg (S). - Inferior vena cava: The vessel was dilated. The respirophasic  diameter changes were blunted (< 50%), consistent with elevated  central venous pressure. - Pericardium, extracardiac: There was no pericardial effusion.  Impressions:  - LVEF 30-35%, mildly dilated with global hypokinesis, mild MR,  normal biatrial size, moderate TR, RVSP 47 mmHg, dilated IVC, no  pericardial effusion.  Chest CT 09/27/2015: FINDINGS: Evaluation of this exam is limited due to respiratory motion artifact.  There is an area of ground-glass on nodular airspace opacity involving the posterior apical segment of the left upper lobe most compatible with pneumonia. Right apical nodular and ground-glass density is also  noted to a lesser degree. There is mild diffuse interstitial prominence which may represent mild edema. There is moderate left and small right pleural effusions. No pneumothorax. The central airways are patent.  There is minimal atherosclerotic calcification of the thoracic aorta.  There is apparent peripheral filling defect in the left pulmonary artery at the bifurcation to the upper and lower lobe (series 5 image 118 - 131). This may represent pulmonary artery embolus or mass effect and compression or invasion by a centrally  located left hilar mass. An ill-defined 2.0 x 1.5 cm density in the left hilar region (series 5, image 107) may represent combination of adenopathy and consolidated lung or a centrally located/partially obstructing mass. Follow-up with CT is recommended to ensure resolution of the left lung opacity and exclude an underlying hilar mass. No other central pulmonary artery embolus identified. Evaluation of the distal branches of the pulmonary arteries is limited due to respiratory motion artifact and suboptimal visualization.  There is no cardiomegaly or pericardial effusion. Left hilar adenopathy. The esophagus is grossly unremarkable with there is a small left thyroid hypodense nodule. Ultrasound may provide better evaluation.  There is no axillary adenopathy. There is diffuse subcutaneous soft tissue stranding and edema the chest wall no fluid collection. There is degenerative changes of the spine. No acute fracture.  There is retrograde flow of contrast from the right atrium into the IVC suggestive of a degree of right cardiac dysfunction. A 1.9 cm left renal upper pole hypodense lesion is not well characterized. This is stable compared to the CT dated 11/18/2013 and likely represents a cyst.  Review of the MIP images confirms the above findings.  IMPRESSION: Left upper lobe pneumonia. An ill-defined centrally located left hilar density may represent combination of adenopathy and consolidated portion of the lung versus a hilar mass. Follow-up is recommended to document resolution of the airspace opacity and exclude underlying hilar mass.  Apparent peripheral filling defect in the left pulmonary artery at the bifurcation of the left upper and left lower lobe branches likely represent extrinsic compression on the vessel by the hilar adenopathy/ mass or invasion of a left hilar lesion into the adjacent pulmonary artery. Acute pulmonary embolus is less likely, given  peripheral orientation, but not entirely excluded.  Interstitial edema and bilateral pleural effusions, left greater right.  Medications:   Scheduled Medications: . aspirin EC  81 mg Oral Daily  . carvedilol  6.25 mg Oral BID WC  . folic acid  1 mg Oral Daily  . furosemide  40 mg Intravenous TID  . insulin aspart  0-15 Units Subcutaneous TID WC  . insulin aspart  0-5 Units Subcutaneous QHS  . insulin glargine  7 Units Subcutaneous Daily  . lisinopril  5 mg Oral Daily  . multivitamin with minerals  1 tablet Oral Daily  . potassium chloride  20 mEq Oral Daily  . sodium chloride flush  3 mL Intravenous Q12H  . sodium chloride flush  3 mL Intravenous Q12H  . spironolactone  12.5 mg Oral Daily  . thiamine  100 mg Oral Daily    Infusions: . heparin 2,500 Units/hr (10/03/15 0634)    PRN Medications: sodium chloride, benzonatate, hydrALAZINE, ondansetron **OR** ondansetron (ZOFRAN) IV, polyethylene glycol, sodium chloride flush   Assessment:   1. Acute systolic heart failure with newly diagnosed cardiomyopathy and LVEF 30-35%. Myoview indicates large region of inferolateral scar with peri-infarct ischemia per review of images. Echocardiogram showed diffuse LV hypokinesis and with history of regular alcohol  use, nonischemic etiology also possible. Plan for right and left heart catheterization tomorrow to assess coronary anatomy and hemodynamics. Continue Coreg, Lisinopril, Aldactone and IV Lasix for now. Has had substantial diuresis with stable creatinine.  2. Left hilar density by chest CT in setting of PNA, mass versus adenopathy/consolodation. Per PCCM plan for followup CT in several months (see note June 29).  3. RLE DVT. Continue IV Heparin for now with plan to convert to oral DOAC eventually after invasive testing complete.  4. CAP, per admitting team.   Plan/Discussion:    For right and left heart catheterization tomorrow, orders placed per Dr. Allyson SabalBerry. If PCI  considered need to keep in mind active RLE DVT and need for DOAC as well as potential for further workup of pulmonary mass in the future.   Jonelle SidleSamuel G. McDowell, M.D., F.A.C.C.

## 2015-10-03 NOTE — Progress Notes (Signed)
Family Medicine Teaching Service Daily Progress Note Intern Pager: [phone]  Patient name: Robert Hampton Medical record number: [id number] Date of birth: 08-22-1955 Age: 60 y.o. Gender: male  Primary Care Provider: No PCP Per Patient Consultants: Pulmonology, Cardiology Code Status: FULL   Pt Overview and Major Events to Date:  6/28: Admit for SOB: CHF, PNA, DVT 6/30: Increased Lasix to TID 7/3: High risk Myoview study  Assessment and Plan: Robert Hampton is a 60 y.o. male presenting with shortness of breath and cough with LE swelling and jaundice, concern for PNA and also possible new CHF and also found to have RLE DVT. PMH is significant for T2DM, tobacco abuse.  New Acute HFrEF with hepatic congestion:  BNP was 1788 in the ED. ECHO: with EF 30-35%, mildly dilated with global hypokinesis, RVSP , dilated IVC. Repeat BNP 1061.5 (7/01). UOP 3.7L yesterday, with neg output -14.7 since admission. Weight: 236lb >220lb. Creatinine stable at 0.77-0.88 (Baseline ~ 0.8). Myoview showed high risk study, EF 22%, large inferolateral scar with peri-infarct ischemia. - Cardiology following - IV Lasix  TID - Lisinopril  daily (increased 6/30) - Coreg 6.2 BID (increased 7/3) - Spironolactone 12.5mg  daily  - Plan for cath tomorrow 7/5 per cards - ferritin pending  RLE DVT: Additionally unable to rule out PE on CTA chest. - Heparin per pharmacy  - Per cardiology, keep on heparin drip for now in anticipation of ischemic work up  - Will likely eventually transition to NOAC vs coumadin in the setting of possible malignancy on CT chest   CAP: CTA chest with LUL PNA and L hilar density adenopathy vs consolidation vs hilar mass; peripheral filling defect in L pulm artery likely extrinsic compression by hilar adenopathy/mass or invasion of L hilar lesion into adjacent PA; shows some interstitial edema and bilateral pleural effusions L >R. Urine strep and Legionella neg. Leukocytosis  continues to improve (20 >>15.4>13.1). Respiratory status improved as well, though still some wheezing after exertion. Maintaining sats in 90s on RA. Repeat CXR shows worsened multifocal pneumonia on L and worsened L lower lob consolidation with no change in L upper lobe consolidation. - Levaquin  (6/30) for total 7 days of antibiotics (last dose 7/04), complete - Blood cx: NGx3d   - Cardiac monitoring and continuous pulse ox  Elevated Liver Enzymes / Bilirubin / Jaundice 2/2 hepatic congestion, improving: Due to hepatic congestion in the setting of acute CHF exacerbation. No abdominal pain. AST 135 >> 35, ALT 525 >>178. Total bilirubin 1.9 >> 1.2. Drinks 2-3 glasses of wine per day. Abdominal US unremarkable. Hepatitis panel negative. TSH (nml), Ammonia (32), PT/INR (22/1.93).  - Continue to monitor  Possible Left Hilar Mass on Imaging: Pulmonology consulted: will need follow up CT imaging with contrast if possible in 4-6 weeks to evaluate left hilar mass; f/u outpatient in 4-6 weeks.   Hyponatremia, improving: Na 128 > 135-133. Likely secondary to fluid overloaded and/or due to alcohol use.  - IV Lasix - Continue to monitor  Type 2 DM: Diagnosed 1 week ago. A1c 10.4. CBG 177 this AM.  - Continue Lantus 7U - Moderate SSI - CBGs with meals and at bedtime  Hypertension: BP 113-137/68-97 - Lisinopril  - Coreg 6.2 mg BID - Spironolactone 12.5mg  daily  - Hydralazine prn  Tobacco abuse: 3/4 PPD for 45 years - Declines Nicotine patch at this time.  Alcohol use: Drinks 2-3 glasses of wine per day. CIWA scores 0. - Continue to monitor  FEN/GI: Heart healthy diet, Miralax prn,  SLIV Prophylaxis: Heparin drip  Disposition: pending medical improvement.   Subjective:  Patient reports no change from yesterday. Denies SOB but continues to cough. States continues to ambulate well and has some  b/l LE swelling that he still feels is improving throughout hospital stay. Has no other  questions or complaints.   Objective: Temp:  [98.4 F (36.9 C)-98.6 F (37 C)] 98.6 F (37 C) (07/04 0514) Pulse Rate:  [88-94] 89 (07/04 0514) Resp:  [18] 18 (07/04 0514) BP: (120-144)/(83-93) 142/87 mmHg (07/04 0514) SpO2:  [97 %-100 %] 98 % (07/04 0514) Weight:  [220 lb 9.6 oz (100.064 kg)] 220 lb 9.6 oz (100.064 kg) (07/04 0514)   Physical Exam: General: sitting in chair in NAD Neck: No JVD Cardiovascular: RRR, no murmurs appreciated Respiratory: No increased WOB. Crackles at lung bases bilaterally with some coughing after exertion.   Abdomen: Soft, NT, distended Extremities: 2+ pitting edema to midshin bilaterally lower extremity edema with R > L. No calf warmth, erythema, or TTP bilaterally.   Laboratory:  Recent Labs Lab 09/29/15 0222 09/30/15 0508 10/01/15 0345  WBC 18.3* 15.4* 13.1*  HGB 15.4 15.4 15.3  HCT 46.5 48.0 46.8  PLT 240 270 274    Recent Labs Lab 09/28/15 0219 09/29/15 0222 09/30/15 0508 10/01/15 0345 10/02/15 0520 10/03/15 0216  NA 128* 132* 132* 135 134* 133*  K 4.3 4.2 4.1 3.2* 4.3 3.7  CL 98* 94* 93* 97* 95* 95*  CO2 23 30 29 29  32 28  BUN 13 14 14 16 14 19   CREATININE 0.79 0.86 0.88 0.77 0.98 0.80  CALCIUM 8.2* 8.4* 8.4* 8.3* 8.8* 8.4*  PROT 5.4* 5.3* 5.0*  --   --   --   BILITOT 1.5* 1.9* 1.2  --   --   --   ALKPHOS 149* 123 108  --   --   --   ALT 424* 284* 178*  --   --   --   AST 104* 61* 35  --   --   --   GLUCOSE 174* 160* 208* 175* 135* 179*  Troponin: 0.07 > 0.06 BNP 1788  Imaging/Diagnostic Tests: Dg Chest 2 View  10/03/2015  CLINICAL DATA:  I50.9 (ICD-10-CM) - CHF (congestive heart failure) (HCC) J18.9 (ICD-10-CM) - PNA (pneumonia)Cough  cp EXAM: CHEST  2 VIEW COMPARISON:  09/27/2015 FINDINGS: Since the prior exam, worsened consolidation has developed at the left lung base now obscuring the hemidiaphragm and portions of the left heart border. Consolidation in the left upper lobe is similar to the prior exam. The right lung  remains essentially clear. No convincing pleural effusion.  No pneumothorax. Cardiac silhouette is normal in size. No mediastinal or hilar masses or convincing adenopathy. IMPRESSION: 1. Worsened multifocal pneumonia on the left when compared with the prior study. There is increased consolidation in the left lower lobe, likely involving portions of the posterior left upper lobe lingula. Left upper lobe consolidation is without significant change. Electronically Signed   By: Amie Portland M.D.   On: 10/03/2015 08:58   Nm Myocar Multi W/spect W/wall Motion / Ef  10/02/2015   There was no ST segment deviation noted during stress.  T wave inversion was noted during stress in the V2 leads, beginning at 0 minutes of stress. T wave inversion persisted.  Defect 1: There is a large defect of severe severity present in the basal inferior, basal inferolateral, mid inferior, mid inferolateral, apical inferior and apical lateral location.  Findings consistent with prior  Inferior / Inferolateral myocardial infarction. There is no evidence of ischemia .  Nuclear stress EF: 22%.  This is a high risk study.  The left ventricular ejection fraction is severely decreased (<30%).    US Abdomen:  1. Mild gallbladder wall thickening and a tiny amount of pericholecystic fluid identified. No stones, sludge, or Murphy's sign. If there is concern for acute cholecystitis, a HIDA scan could better evaluate. 2. Bilateral renal cysts.  ECHO 6/29:  LVEF 30-35%, mildly dilated with global hypokinesis, mild MR, normal biatrial size, moderate TR, RVSP 47 mmHg, dilated IVC, no pericardial effusion.  CTA Chest: 6/28: FINDINGS: Evaluation of this exam is limited due to respiratory motion artifact.  There is an area of ground-glass on nodular airspace opacity involving the posterior apical segment of the left upper lobe most compatible with pneumonia. Right apical nodular and ground-glass density is also noted to a lesser  degree. There is mild diffuse interstitial prominence which may represent mild edema. There is moderate left and small right pleural effusions. No pneumothorax. The central airways are patent.  There is minimal atherosclerotic calcification of the thoracic aorta.  There is apparent peripheral filling defect in the left pulmonary artery at the bifurcation to the upper and lower lobe (series 5 image 118 - 131). This may represent pulmonary artery embolus or mass effect and compression or invasion by a centrally located left hilar mass. An ill-defined 2.0 x 1.5 cm density in the left hilar region (series 5, image 107) may represent combination of adenopathy and consolidated lung or a centrally located/partially obstructing mass. Follow-up with CT is recommended to ensure resolution of the left lung opacity and exclude an underlying hilar mass. No other central pulmonary artery embolus identified. Evaluation of the distal branches of the pulmonary arteries is limited due to respiratory motion artifact and suboptimal visualization.  There is no cardiomegaly or pericardial effusion. Left hilar adenopathy. The esophagus is grossly unremarkable with there is a small left thyroid hypodense nodule. Ultrasound may provide better evaluation.  There is no axillary adenopathy. There is diffuse subcutaneous soft tissue stranding and edema the chest wall no fluid collection. There is degenerative changes of the spine. No acute fracture.  There is retrograde flow of contrast from the right atrium into the IVC suggestive of a degree of right cardiac dysfunction. A 1.9 cm left renal upper pole hypodense lesion is not well characterized. This is stable compared to the CT dated 11/18/2013 and likely represents a cyst.  Review of the MIP images confirms the above findings.  IMPRESSION: Left upper lobe pneumonia. An ill-defined centrally located left hilar density may represent combination of  adenopathy and consolidated portion of the lung versus a hilar mass. Follow-up is recommended to document resolution of the airspace opacity and exclude underlying hilar mass.  Apparent peripheral filling defect in the left pulmonary artery at the bifurcation of the left upper and left lower lobe branches likely represent extrinsic compression on the vessel by the hilar adenopathy/ mass or invasion of a left hilar lesion into the adjacent pulmonary artery. Acute pulmonary embolus is less likely, given peripheral orientation, but not entirely excluded.  Interstitial edema and bilateral pleural effusions, left greater right.  Bilateral LE Venous Dopplers 6/29: - Findings consistent with acute deep vein thrombosis involving the  right femoral vein, right popliteal vein, right posterior tibial  veins, and right peroneal veins. - No evidence of deep vein or superficial thrombosis involving the  left lower extremity and right common femoral  vein. - No evidence of Baker's cyst on the right or left.  Leland HerElsia J Cerena Baine, DO 10/03/2015, 8:13 AM PGY-1,  Family Medicine FPTS Intern pager: [phone], text pages welcome

## 2015-10-03 NOTE — Progress Notes (Signed)
ANTICOAGULATION CONSULT NOTE - Follow Up Consult  Pharmacy Consult for heparin Indication: RLE DVT  Allergies  Allergen Reactions  . Bee Venom Anaphylaxis    Patient Measurements: Height: 6' (182.9 cm) Weight: 220 lb 9.6 oz (100.064 kg) (scale a) IBW/kg (Calculated) : 77.6 Heparin Dosing Weight: 98 kg  Vital Signs: Temp: 98.6 F (37 C) (07/04 0514) Temp Source: Oral (07/04 0514) BP: 142/87 mmHg (07/04 0514) Pulse Rate: 89 (07/04 0514)  Labs:  Recent Labs  10/01/15 0345 10/02/15 0520 10/02/15 0828 10/03/15 0216  HGB 15.3  --   --   --   HCT 46.8  --   --   --   PLT 274  --   --   --   LABPROT  --   --  17.9* 18.0*  INR  --   --  1.47 1.48  HEPARINUNFRC 0.30 0.46  --  0.38  CREATININE 0.77 0.98  --  0.80    Estimated Creatinine Clearance: 123.3 mL/min (by C-G formula based on Cr of 0.8).   Medications:  Prescriptions prior to admission  Medication Sig Dispense Refill Last Dose  . aspirin EC 81 MG tablet Take 81 mg by mouth daily.   09/26/2015 at pm  . glipiZIDE (GLUCOTROL) 5 MG tablet Take 1 tablet (5 mg total) by mouth 2 (two) times daily before a meal. 60 tablet 3 09/27/2015 at am  . ibuprofen (ADVIL,MOTRIN) 200 MG tablet Take 600 mg by mouth every 6 (six) hours as needed for moderate pain. Reported on 09/27/2015   Past Week at Unknown time  . OVER THE COUNTER MEDICATION Emergen-C packets: Mix and drink one packet by mouth daily   09/26/2015 at am    Assessment: 60 yo M admitted 09/27/2015 found to have new RLE DVT. Pharmacy consulted to dose heparin.  HL 0.38 (therapeutic), Hgb 15.3, Plt 274. No s/sx of bleeding noted.  Goal of Therapy:  Heparin level 0.3-0.7 units/ml Monitor platelets by anticoagulation protocol: Yes   Plan:  - Continue heparin at 2500 units/hr - Monitor daily HL, CBC and s/sx of bleeding  Casilda Carlsaylor Emmalou Hunger, PharmD. PGY-2 Pharmacy Resident Pager: [phone] 10/03/2015,8:47 AM

## 2015-10-04 ENCOUNTER — Encounter (HOSPITAL_COMMUNITY)
Admission: EM | Disposition: A | Discharge status: Home / Self Care | Payer: Self-pay | Source: Home / Self Care | Attending: Family Medicine

## 2015-10-04 ENCOUNTER — Encounter (HOSPITAL_COMMUNITY): Payer: Self-pay | Admitting: Cardiovascular Disease

## 2015-10-04 DIAGNOSIS — I25110 Atherosclerotic heart disease of native coronary artery with unstable angina pectoris: Secondary | ICD-10-CM

## 2015-10-04 HISTORY — PX: CARDIAC CATHETERIZATION: SHX172

## 2015-10-04 LAB — POCT I-STAT 3, VENOUS BLOOD GAS (G3P V)
ACID-BASE EXCESS: 4 mmol/L — AB (ref 0.0–2.0)
ACID-BASE EXCESS: 4 mmol/L — AB (ref 0.0–2.0)
BICARBONATE: 31 meq/L — AB (ref 20.0–24.0)
Bicarbonate: 30.8 mEq/L — ABNORMAL HIGH (ref 20.0–24.0)
O2 SAT: 61 %
O2 SAT: 63 %
PH VEN: 7.388 — AB (ref 7.250–7.300)
TCO2: 32 mmol/L (ref 0–100)
TCO2: 33 mmol/L (ref 0–100)
pCO2, Ven: 51.4 mmHg — ABNORMAL HIGH (ref 45.0–50.0)
pCO2, Ven: 52 mmHg — ABNORMAL HIGH (ref 45.0–50.0)
pH, Ven: 7.381 — ABNORMAL HIGH (ref 7.250–7.300)
pO2, Ven: 33 mmHg (ref 31.0–45.0)
pO2, Ven: 34 mmHg (ref 31.0–45.0)

## 2015-10-04 LAB — POCT I-STAT 3, ART BLOOD GAS (G3+)
ACID-BASE DEFICIT: 3 mmol/L — AB (ref 0.0–2.0)
Bicarbonate: 23.8 mEq/L (ref 20.0–24.0)
O2 Saturation: 90 %
TCO2: 25 mmol/L (ref 0–100)
pCO2 arterial: 48 mmHg — ABNORMAL HIGH (ref 35.0–45.0)
pH, Arterial: 7.303 — ABNORMAL LOW (ref 7.350–7.450)
pO2, Arterial: 66 mmHg — ABNORMAL LOW (ref 80.0–100.0)

## 2015-10-04 LAB — GLUCOSE, CAPILLARY
GLUCOSE-CAPILLARY: 212 mg/dL — AB (ref 65–99)
GLUCOSE-CAPILLARY: 290 mg/dL — AB (ref 65–99)
GLUCOSE-CAPILLARY: 215 mg/dL — AB (ref 65–99)

## 2015-10-04 LAB — BASIC METABOLIC PANEL
ANION GAP: 8 (ref 5–15)
ANION GAP: 9 (ref 5–15)
BUN: 16 mg/dL (ref 6–20)
BUN: 16 mg/dL (ref 6–20)
CHLORIDE: 95 mmol/L — AB (ref 101–111)
CO2: 29 mmol/L (ref 22–32)
CO2: 28 mmol/L (ref 22–32)
Calcium: 8.4 mg/dL — ABNORMAL LOW (ref 8.9–10.3)
Calcium: 8.6 mg/dL — ABNORMAL LOW (ref 8.9–10.3)
Chloride: 95 mmol/L — ABNORMAL LOW (ref 101–111)
Creatinine, Ser: 0.88 mg/dL (ref 0.61–1.24)
Creatinine, Ser: 0.77 mg/dL (ref 0.61–1.24)
GFR calc non Af Amer: >60 mL/min (ref >60)
GLUCOSE: 268 mg/dL — AB (ref 65–99)
Glucose, Bld: 283 mg/dL — ABNORMAL HIGH (ref 65–99)
POTASSIUM: 3.6 mmol/L (ref 3.5–5.1)
Potassium: 3.8 mmol/L (ref 3.5–5.1)
SODIUM: 132 mmol/L — AB (ref 135–145)
Sodium: 132 mmol/L — ABNORMAL LOW (ref 135–145)

## 2015-10-04 LAB — CBC
HCT: 46.5 % (ref 39.0–52.0)
Hemoglobin: 15.4 g/dL (ref 13.0–17.0)
MCH: 31.2 pg (ref 26.0–34.0)
MCHC: 33.1 g/dL (ref 30.0–36.0)
MCV: 94.3 fL (ref 78.0–100.0)
PLATELETS: 305 10*3/uL (ref 150–400)
RBC: 4.93 MIL/uL (ref 4.22–5.81)
RDW: 14.3 % (ref 11.5–15.5)
WBC: 11.8 10*3/uL — AB (ref 4.0–10.5)

## 2015-10-04 LAB — PROTIME-INR
INR: 1.4 (ref 0.00–1.49)
INR: 1.36 (ref 0.00–1.49)
PROTHROMBIN TIME: 17.2 s — AB (ref 11.6–15.2)
Prothrombin Time: 16.9 seconds — ABNORMAL HIGH (ref 11.6–15.2)

## 2015-10-04 LAB — HEPARIN LEVEL (UNFRACTIONATED): Heparin Unfractionated: 0.29 IU/mL — ABNORMAL LOW (ref 0.30–0.70)

## 2015-10-04 SURGERY — RIGHT/LEFT HEART CATH AND CORONARY ANGIOGRAPHY
Anesthesia: LOCAL

## 2015-10-04 MED ORDER — HEPARIN SODIUM (PORCINE) 1000 UNIT/ML IJ SOLN
INTRAMUSCULAR | Status: DC | PRN
  Administered 2015-10-04: 4000 [IU] via INTRAVENOUS

## 2015-10-04 MED ORDER — LIDOCAINE HCL (PF) 1 % IJ SOLN
INTRAMUSCULAR | Status: AC
  Filled 2015-10-04: qty 30

## 2015-10-04 MED ORDER — MIDAZOLAM HCL 2 MG/2ML IJ SOLN
INTRAMUSCULAR | Status: AC
  Filled 2015-10-04: qty 2

## 2015-10-04 MED ORDER — VERAPAMIL HCL 2.5 MG/ML IV SOLN
INTRAVENOUS | Status: DC | PRN
  Administered 2015-10-04: 8 mL via INTRA_ARTERIAL

## 2015-10-04 MED ORDER — DIPHENHYDRAMINE HCL 25 MG PO CAPS
25.0000 mg | ORAL_CAPSULE | Freq: Four times a day (QID) | ORAL | Status: DC | PRN
  Administered 2015-10-04: 25 mg via ORAL
  Filled 2015-10-04 (×2): qty 1

## 2015-10-04 MED ORDER — FENTANYL CITRATE (PF) 100 MCG/2ML IJ SOLN
INTRAMUSCULAR | Status: AC
  Filled 2015-10-04: qty 2

## 2015-10-04 MED ORDER — SODIUM CHLORIDE 0.9 % IV SOLN: 250.0000 mL | INTRAVENOUS | Status: DC | PRN

## 2015-10-04 MED ORDER — FENTANYL CITRATE (PF) 100 MCG/2ML IJ SOLN
INTRAMUSCULAR | Status: DC | PRN
  Administered 2015-10-04: 25 ug via INTRAVENOUS

## 2015-10-04 MED ORDER — IOPAMIDOL (ISOVUE-370) INJECTION 76%
INTRAVENOUS | Status: AC
  Filled 2015-10-04: qty 100

## 2015-10-04 MED ORDER — SODIUM CHLORIDE 0.9% FLUSH
3.0000 mL | Freq: Two times a day (BID) | INTRAVENOUS | Status: DC
  Administered 2015-10-04 – 2015-10-06 (×5): 3 mL via INTRAVENOUS

## 2015-10-04 MED ORDER — HEPARIN (PORCINE) IN NACL 2-0.9 UNIT/ML-% IJ SOLN
INTRAMUSCULAR | Status: AC
  Filled 2015-10-04: qty 1000

## 2015-10-04 MED ORDER — HEPARIN (PORCINE) IN NACL 2-0.9 UNIT/ML-% IJ SOLN
INTRAMUSCULAR | Status: DC | PRN
  Administered 2015-10-04: 1000 mL

## 2015-10-04 MED ORDER — VERAPAMIL HCL 2.5 MG/ML IV SOLN
INTRAVENOUS | Status: AC
  Filled 2015-10-04: qty 2

## 2015-10-04 MED ORDER — IOPAMIDOL (ISOVUE-370) INJECTION 76%
INTRAVENOUS | Status: DC | PRN
  Administered 2015-10-04: 50 mL via INTRAVENOUS

## 2015-10-04 MED ORDER — SODIUM CHLORIDE 0.9% FLUSH: 3.0000 mL | INTRAVENOUS | Status: DC | PRN

## 2015-10-04 MED ORDER — LIDOCAINE HCL (PF) 1 % IJ SOLN
INTRAMUSCULAR | Status: DC | PRN
  Administered 2015-10-04: 2 mL
  Administered 2015-10-04: 3 mL

## 2015-10-04 MED ORDER — HEPARIN (PORCINE) IN NACL 100-0.45 UNIT/ML-% IJ SOLN
2600.0000 [IU]/h | INTRAMUSCULAR | Status: DC
  Administered 2015-10-04 – 2015-10-05 (×2): 2700 [IU]/h via INTRAVENOUS
  Administered 2015-10-06: 2600 [IU]/h via INTRAVENOUS
  Filled 2015-10-04 (×2): qty 250

## 2015-10-04 MED ORDER — SODIUM CHLORIDE 0.9% FLUSH
3.0000 mL | Freq: Two times a day (BID) | INTRAVENOUS | Status: DC
  Administered 2015-10-04: 3 mL via INTRAVENOUS

## 2015-10-04 MED ORDER — MIDAZOLAM HCL 2 MG/2ML IJ SOLN
INTRAMUSCULAR | Status: DC | PRN
  Administered 2015-10-04: 2 mg via INTRAVENOUS

## 2015-10-04 MED ORDER — ACETAMINOPHEN 325 MG PO TABS: 650.0000 mg | ORAL_TABLET | ORAL | Status: DC | PRN

## 2015-10-04 MED ORDER — HEPARIN SODIUM (PORCINE) 1000 UNIT/ML IJ SOLN
INTRAMUSCULAR | Status: AC
  Filled 2015-10-04: qty 1

## 2015-10-04 SURGICAL SUPPLY — 12 items
CATH BALLN WEDGE 5F 110CM (CATHETERS) ×1 IMPLANT
CATH INFINITI 5 FR JL3.5 (CATHETERS) ×2 IMPLANT
CATH INFINITI 5FR ANG PIGTAIL (CATHETERS) ×1 IMPLANT
CATH INFINITI JR4 5F (CATHETERS) ×1 IMPLANT
DEVICE RAD COMP TR BAND LRG (VASCULAR PRODUCTS) ×1 IMPLANT
GLIDESHEATH SLEND SS 6F .021 (SHEATH) ×1 IMPLANT
KIT HEART LEFT (KITS) ×2 IMPLANT
PACK CARDIAC CATHETERIZATION (CUSTOM PROCEDURE TRAY) ×2 IMPLANT
SHEATH FAST CATH BRACH 5F 5CM (SHEATH) ×1 IMPLANT
TRANSDUCER W/STOPCOCK (MISCELLANEOUS) ×3 IMPLANT
TUBING CIL FLEX 10 FLL-RA (TUBING) ×2 IMPLANT
WIRE SAFE-T 1.5MM-J .035X260CM (WIRE) ×2 IMPLANT

## 2015-10-04 NOTE — H&P (View-Only) (Signed)
Patient Name: Robert GeneraJonathan Rzepka Date of Encounter: 10/04/2015   SUBJECTIVE  Fatigued. SOB stable. No chest pain.   CURRENT MEDS . aspirin EC  81 mg Oral Daily  . carvedilol  6.25 mg Oral BID WC  . folic acid  1 mg Oral Daily  . furosemide  40 mg Intravenous TID  . insulin aspart  0-15 Units Subcutaneous TID WC  . insulin aspart  0-5 Units Subcutaneous QHS  . insulin glargine  7 Units Subcutaneous Daily  . lisinopril  5 mg Oral Daily  . multivitamin with minerals  1 tablet Oral Daily  . potassium chloride  20 mEq Oral Daily  . sodium chloride flush  3 mL Intravenous Q12H  . sodium chloride flush  3 mL Intravenous Q12H  . sodium chloride flush  3 mL Intravenous Q12H  . spironolactone  12.5 mg Oral Daily  . thiamine  100 mg Oral Daily    OBJECTIVE  Filed Vitals:   10/03/15 0514 10/03/15 1131 10/03/15 1956 10/04/15 0612  BP: 142/87 119/70 123/87 135/84  Pulse: 89 81 86 93  Temp: 98.6 F (37 C) 97.5 F (36.4 C) 97.7 F (36.5 C) 98.5 F (36.9 C)  TempSrc: Oral Oral Oral Oral  Resp: 18 18 18 18   Height:      Weight: 220 lb 9.6 oz (100.064 kg)   218 lb 9.6 oz (99.156 kg)  SpO2: 98% 96% 98% 96%    Intake/Output Summary (Last 24 hours) at 10/04/15 0908 Last data filed at 10/04/15 0842  Gross per 24 hour  Intake 2249.21 ml  Output   3350 ml  Net -1100.79 ml   Filed Weights   10/02/15 0512 10/03/15 0514 10/04/15 0612  Weight: 226 lb 3.2 oz (102.604 kg) 220 lb 9.6 oz (100.064 kg) 218 lb 9.6 oz (99.156 kg)    PHYSICAL EXAM  General: Pleasant, NAD. Neuro: Alert and oriented X 3. Moves all extremities spontaneously. Psych: Normal affect. HEENT:  Normal  Neck: Supple without bruits or JVD. Lungs:  Resp regular and unlabored, CTA. Heart: RRR no s3, s4, or murmurs. Abdomen: Soft, non-tender, non-distended, BS + x 4.  Extremities: No clubbing, cyanosis. Mild R leg edema. DP/PT/Radials 2+ and equal bilaterally.  Accessory Clinical Findings  CBC  Recent Labs  10/04/15 0032  WBC 11.8*  HGB 15.4  HCT 46.5  MCV 94.3  PLT 305   Basic Metabolic Panel  Recent Labs  10/04/15 0032 10/04/15 0339  NA 132* 132*  K 3.6 3.8  CL 95* 95*  CO2 29 28  GLUCOSE 283* 268*  BUN 16 16  CREATININE 0.88 0.77  CALCIUM 8.4* 8.6*   Liver Function Tests No results for input(s): AST, ALT, ALKPHOS, BILITOT, PROT, ALBUMIN in the last 72 hours. No results for input(s): LIPASE, AMYLASE in the last 72 hours. Cardiac Enzymes No results for input(s): CKTOTAL, CKMB, CKMBINDEX, TROPONINI in the last 72 hours. BNP Invalid input(s): POCBNP D-Dimer No results for input(s): DDIMER in the last 72 hours. Hemoglobin A1C No results for input(s): HGBA1C in the last 72 hours. Fasting Lipid Panel No results for input(s): CHOL, HDL, LDLCALC, TRIG, CHOLHDL, LDLDIRECT in the last 72 hours. Thyroid Function Tests No results for input(s): TSH, T4TOTAL, T3FREE, THYROIDAB in the last 72 hours.  Invalid input(s): FREET3  TELE  Sinus rhythm   Radiology/Studies  Dg Chest 2 View  10/03/2015  CLINICAL DATA:  I50.9 (ICD-10-CM) - CHF (congestive heart failure) (HCC) J18.9 (ICD-10-CM) - PNA (pneumonia)Cough  cp EXAM: CHEST  2 VIEW COMPARISON:  09/27/2015 FINDINGS: Since the prior exam, worsened consolidation has developed at the left lung base now obscuring the hemidiaphragm and portions of the left heart border. Consolidation in the left upper lobe is similar to the prior exam. The right lung remains essentially clear. No convincing pleural effusion.  No pneumothorax. Cardiac silhouette is normal in size. No mediastinal or hilar masses or convincing adenopathy. IMPRESSION: 1. Worsened multifocal pneumonia on the left when compared with the prior study. There is increased consolidation in the left lower lobe, likely involving portions of the posterior left upper lobe lingula. Left upper lobe consolidation is without significant change. Electronically Signed   By: Amie Portland M.D.   On:  10/03/2015 08:58   Dg Chest 2 View  09/27/2015  CLINICAL DATA:  Cough and shortness of breath, progressing EXAM: CHEST  2 VIEW COMPARISON:  September 19, 2015 FINDINGS: There is now airspace consolidation throughout the posterior segment of the left upper lobe. There is slight atelectasis in the left base. The right lung is clear. Heart size and pulmonary vascularity are within normal limits. No adenopathy. There is a benign exostosis arising from the inferior anterior right first rib. There is degenerative change in the thoracic spine. IMPRESSION: Airspace consolidation throughout the posterior segment of the left upper lobe consistent with pneumonia. Mild left base atelectasis. Right lung clear. Stable cardiac silhouette. These results will be called to the ordering clinician or representative by the Radiologist Assistant, and communication documented in the PACS or zVision Dashboard. Electronically Signed   By: Bretta Bang III M.D.   On: 09/27/2015 14:35   Dg Chest 2 View  09/19/2015  CLINICAL DATA:  Two days of dyspnea, shortness of breath, and fatigue EXAM: CHEST  2 VIEW COMPARISON:  None in PACs FINDINGS: The lungs are adequately inflated. The interstitial markings are increased bilaterally. Coarse lung markings in the infrahilar regions bilaterally are noted as well. The heart is top-normal in size. The pulmonary vascularity is normal. There is multilevel degenerative disc disease of the thoracic spine with calcification of portions of the anterior longitudinal ligament. There is calcification associated with the first costosternal junction. IMPRESSION: Probable chronic bronchitis with superimposed interstitial pneumonia. There is no alveolar infiltrate nor CHF. Followup PA and lateral chest X-ray is recommended in 3-4 weeks following trial of antibiotic therapy to ensure resolution and exclude underlying malignancy. Electronically Signed   By: David  [place] M.D.   On: 09/19/2015 16:19   Ct Angio  Chest Pe W Or Wo Contrast  09/28/2015  CLINICAL DATA:  60 year old male with shortness of breath. EXAM: CT ANGIOGRAPHY CHEST WITH CONTRAST TECHNIQUE: Multidetector CT imaging of the chest was performed using the standard protocol during bolus administration of intravenous contrast. Multiplanar CT image reconstructions and MIPs were obtained to evaluate the vascular anatomy. CONTRAST:  100 cc Isovue 370 COMPARISON:  Chest radiograph dated 09/27/2015 FINDINGS: Evaluation of this exam is limited due to respiratory motion artifact. There is an area of ground-glass on nodular airspace opacity involving the posterior apical segment of the left upper lobe most compatible with pneumonia. Right apical nodular and ground-glass density is also noted to a lesser degree. There is mild diffuse interstitial prominence which may represent mild edema. There is moderate left and small right pleural effusions. No pneumothorax. The central airways are patent. There is minimal atherosclerotic calcification of the thoracic aorta. There is apparent peripheral filling defect in the left pulmonary artery at the bifurcation to the upper and lower  lobe (series 5 image 118 - 131). This may represent pulmonary artery embolus or mass effect and compression or invasion by a centrally located left hilar mass. An ill-defined 2.0 x 1.5 cm density in the left hilar region (series 5, image 107) may represent combination of adenopathy and consolidated lung or a centrally located/partially obstructing mass. Follow-up with CT is recommended to ensure resolution of the left lung opacity and exclude an underlying hilar mass. No other central pulmonary artery embolus identified. Evaluation of the distal branches of the pulmonary arteries is limited due to respiratory motion artifact and suboptimal visualization. There is no cardiomegaly or pericardial effusion. Left hilar adenopathy. The esophagus is grossly unremarkable with there is a small left thyroid  hypodense nodule. Ultrasound may provide better evaluation. There is no axillary adenopathy. There is diffuse subcutaneous soft tissue stranding and edema the chest wall no fluid collection. There is degenerative changes of the spine. No acute fracture. There is retrograde flow of contrast from the right atrium into the IVC suggestive of a degree of right cardiac dysfunction. A 1.9 cm left renal upper pole hypodense lesion is not well characterized. This is stable compared to the CT dated 11/18/2013 and likely represents a cyst. Review of the MIP images confirms the above findings. IMPRESSION: Left upper lobe pneumonia. An ill-defined centrally located left hilar density may represent combination of adenopathy and consolidated portion of the lung versus a hilar mass. Follow-up is recommended to document resolution of the airspace opacity and exclude underlying hilar mass. Apparent peripheral filling defect in the left pulmonary artery at the bifurcation of the left upper and left lower lobe branches likely represent extrinsic compression on the vessel by the hilar adenopathy/ mass or invasion of a left hilar lesion into the adjacent pulmonary artery. Acute pulmonary embolus is less likely, given peripheral orientation, but not entirely excluded. Interstitial edema and bilateral pleural effusions, left greater right. These results were called by telephone at the time of interpretation on 09/28/2015 at 12:52 am to Dr. Wende Mott, who verbally acknowledged these results. Electronically Signed   By: Elgie Collard M.D.   On: 09/28/2015 00:56   US Abdomen Complete  09/28/2015  CLINICAL DATA:  Transaminitis EXAM: ABDOMEN ULTRASOUND COMPLETE COMPARISON:  CT scan November 18, 2013 FINDINGS: Gallbladder: The gallbladder wall is mildly thickened measuring 5 mm. There is slight pericholecystic fluid. No stones, sludge, or Murphy's sign. Common bile duct: Diameter: 3 mm Liver: No focal lesion identified. Within normal limits in  parenchymal echogenicity. IVC: No abnormality visualized. Pancreas: The pancreas was not visualized. Spleen: Size and appearance within normal limits. Right Kidney: Length: 13.4 cm. Known renal cysts. The largest measures 2.3 cm, not changed since August 2015. Left Kidney: Length: 12.9 cm.  Known renal cysts. Abdominal aorta: No aneurysm visualized. Other findings: None. IMPRESSION: 1. Mild gallbladder wall thickening and a tiny amount of pericholecystic fluid identified. No stones, sludge, or Murphy's sign. If there is concern for acute cholecystitis, a HIDA scan could better evaluate. 2. Bilateral renal cysts. Electronically Signed   By: Gerome Sam III M.D   On: 09/28/2015 18:02   Nm Myocar Multi W/spect W/wall Motion / Ef  10/02/2015   There was no ST segment deviation noted during stress.  T wave inversion was noted during stress in the V2 leads, beginning at 0 minutes of stress. T wave inversion persisted.  Defect 1: There is a large defect of severe severity present in the basal inferior, basal inferolateral, mid inferior, mid inferolateral,  apical inferior and apical lateral location.  Findings consistent with prior Inferior / Inferolateral myocardial infarction. There is no evidence of ischemia .  Nuclear stress EF: 22%.  This is a high risk study.  The left ventricular ejection fraction is severely decreased (<30%).     ASSESSMENT AND PLAN   1. Acute systolic CHF - echo showed LV EF of 30-35%, mildly dilated with global hypokinesis, RVSP 47 mm Hg. Myoview high risk but no evidence of ischemia. Suspects non-ischemic cardiomyopathy. For L & R cath today. If need of PCI --> consider need of DOAC due to DVT and potential pulmonary workup in future.  - Diuresed negative 15.7 L. Weight down 32lb.  -  Continue ASA, BB, ACE, spironolactone. He is on IV lasix 40mg  TID. He appears euvolemic. Consider switch to po.   2. CAP and Left hilar density by chest CT - Mass vs adenopathy. CT follow up   In several months.   3. RLE DVT - On heparin. Plan to change to oral DOAC post cath.    Signed, Bhagat,Bhavinkumar PA-C Pager [phone]  Agree with note by Chelsea AusVin Bhagat PA-C  Pt for R/L heart cath today. No change in Sx. Will need ultimately to be on DOAC for DVT (BMS if intervention required), as well as pulm w/u for mass.   Runell GessJonathan J. Aaleyah Witherow, M.D., FACP, [facility] HospitalFACC, Earl LagosFAHA, [facility] PLLCFSCAI [facility] [address] [place], [place]  [id number]  [phone] 10/04/2015 9:53 AM

## 2015-10-04 NOTE — Progress Notes (Signed)
Family Medicine Teaching Service Daily Progress Note Intern Pager: [phone]  Patient name: Robert Hampton Medical record number: [id number] Date of birth: 1956/02/23 Age: 60 y.o. Gender: male  Primary Care Provider: No PCP Per Patient Consultants: Pulmonology, Cardiology Code Status: FULL   Pt Overview and Major Events to Date:  6/28: Admit for SOB: CHF, PNA, DVT 6/30: Increased Lasix to TID 7/3: High risk Myoview study  Assessment and Plan: Robert Hampton is a 60 y.o. male presenting with shortness of breath and cough with LE swelling and jaundice, concern for PNA and also possible new CHF and also found to have RLE DVT. PMH is significant for T2DM, tobacco abuse.  New Acute HFrEF with hepatic congestion:  BNP was 1788 in the ED. ECHO: with EF 30-35%, mildly dilated with global hypokinesis, RVSP 47mmHg, dilated IVC. Repeat BNP 1061.5 (7/01). UOP 3.7L yesterday, with neg output -14.7 since admission. Weight: 236lb >220lb. Creatinine stable at 0.77-0.88 (Baseline ~ 0.8). Myoview showed high risk study, EF 22%, large inferolateral scar with peri-infarct ischemia. Ferritin 252.  - Cardiology following - IV Lasix 40mg  TID - Lisinopril 5mg  daily (increased 6/30) - Coreg 6.2 BID (increased 7/3) - Spironolactone 12.5mg  daily  - Plan for cath today7/5 per cards  RLE DVT: Additionally unable to rule out PE on CTA chest. - Heparin per pharmacy  - Per cardiology, keep on heparin drip for now in anticipation of ischemic work up  - Will likely eventually transition to NOAC in the setting of possible malignancy on CT chest   CAP: CTA chest with LUL PNA and L hilar density adenopathy vs consolidation vs hilar mass; peripheral filling defect in L pulm artery likely extrinsic compression by hilar adenopathy/mass or invasion of L hilar lesion into adjacent PA; shows some interstitial edema and bilateral pleural effusions L >R. Urine strep and Legionella neg. Leukocytosis continues to improve (20  >>15.4>13.1). Respiratory status improved as well, though still some wheezing after exertion. Maintaining sats in 90s on RA. Repeat CXR shows worsened multifocal pneumonia on L and worsened L lower lob consolidation with no change in L upper lobe consolidation. - Levaquin 750mg  (6/30) for total 7 days of antibiotics (last dose 7/04), complete - Blood cx: NGx3d   - Cardiac monitoring and continuous pulse ox  Elevated Liver Enzymes / Bilirubin / Jaundice 2/2 hepatic congestion, improving: Due to hepatic congestion in the setting of acute CHF exacerbation. No abdominal pain. AST 135 >> 35, ALT 525 >>178. Total bilirubin 1.9 >> 1.2. Drinks 2-3 glasses of wine per day. Abdominal US unremarkable. Hepatitis panel negative. TSH (nml), Ammonia (32), PT/INR (22/1.93).  - Continue to monitor  Possible Left Hilar Mass on Imaging: Pulmonology consulted: will need follow up CT imaging with contrast if possible in 4-6 weeks to evaluate left hilar mass; f/u outpatient in 4-6 weeks.   Hyponatremia, improving: Na 128 > 135-133. Likely secondary to fluid overloaded and/or due to alcohol use.  - IV Lasix - Continue to monitor  Type 2 DM: Diagnosed 1 week ago. A1c 10.4. CBG 177 this AM.  - Continue Lantus 7U - Moderate SSI - CBGs with meals and at bedtime  Hypertension: BP 113-137/68-97 - Lisinopril 5mg  - Coreg 6.2 mg BID - Spironolactone 12.5mg  daily  - Hydralazine prn  Tobacco abuse: 3/4 PPD for 45 years - Declines Nicotine patch at this time.  Alcohol use: Drinks 2-3 glasses of wine per day. CIWA scores 0. - Continue to monitor  FEN/GI: Heart healthy diet, Miralax prn, SLIV Prophylaxis: Heparin  drip  Disposition: pending medical improvement.   Subjective:  Patient reports no change from yesterday. Denies SOB but continues to cough. States continues to ambulate well and has some b/l LE swelling that he still feels is improving throughout hospital stay. Is anxious about cath today and is tearful  about absence from work and difficulty with finances.  Objective: Temp:  [97.5 F (36.4 C)-98.5 F (36.9 C)] 98.5 F (36.9 C) (07/05 0612) Pulse Rate:  [81-93] 93 (07/05 0612) Resp:  [18] 18 (07/05 0612) BP: (119-135)/(70-87) 135/84 mmHg (07/05 0612) SpO2:  [96 %-98 %] 96 % (07/05 0612) Weight:  [218 lb 9.6 oz (99.156 kg)] 218 lb 9.6 oz (99.156 kg) (07/05 0612)   Physical Exam:  General: sitting in chair in NAD Neck: No JVD Cardiovascular: RRR, no murmurs appreciated Respiratory: No increased WOB. Crackles at lung bases bilaterally with some coughing after exertion.   Abdomen: Soft, NT, distended Extremities: 1+ pitting edema to midshin bilaterally lower extremity edema with R > L. No calf warmth, erythema, or TTP bilaterally.   Laboratory:  Recent Labs Lab 09/30/15 0508 10/01/15 0345 10/04/15 0032  WBC 15.4* 13.1* 11.8*  HGB 15.4 15.3 15.4  HCT 48.0 46.8 46.5  PLT 270 274 305    Recent Labs Lab 09/28/15 0219 09/29/15 0222 09/30/15 0508  10/03/15 0216 10/04/15 0032 10/04/15 0339  NA 128* 132* 132*  < > 133* 132* 132*  K 4.3 4.2 4.1  < > 3.7 3.6 3.8  CL 98* 94* 93*  < > 95* 95* 95*  CO2 < > BUN < > CREATININE 0.79 0.86 0.88  < > 0.80 0.88 0.77  CALCIUM 8.2* 8.4* 8.4*  < > 8.4* 8.4* 8.6*  PROT 5.4* 5.3* 5.0*  --   --   --   --   BILITOT 1.5* 1.9* 1.2  --   --   --   --   ALKPHOS 149* 123 108  --   --   --   --   ALT 424* 284* 178*  --   --   --   --   AST 104* 61* 35  --   --   --   --   GLUCOSE 174* 160* 208*  < > 179* 283* 268*  < > = values in this interval not displayed.Troponin: 0.07 > 0.06 BNP 1788  Imaging/Diagnostic Tests: Dg Chest 2 View  10/03/2015  CLINICAL DATA:  I50.9 (ICD-10-CM) - CHF (congestive heart failure) (HCC) J18.9 (ICD-10-CM) - PNA (pneumonia)Cough  cp EXAM: CHEST  2 VIEW COMPARISON:  09/27/2015 FINDINGS: Since the prior exam, worsened consolidation has developed at the left lung base now obscuring  the hemidiaphragm and portions of the left heart border. Consolidation in the left upper lobe is similar to the prior exam. The right lung remains essentially clear. No convincing pleural effusion.  No pneumothorax. Cardiac silhouette is normal in size. No mediastinal or hilar masses or convincing adenopathy. IMPRESSION: 1. Worsened multifocal pneumonia on the left when compared with the prior study. There is increased consolidation in the left lower lobe, likely involving portions of the posterior left upper lobe lingula. Left upper lobe consolidation is without significant change. Electronically Signed   By: Amie Portland M.D.   On: 10/03/2015 08:58   Nm Myocar Multi W/spect W/wall Motion / Ef  10/02/2015   There was no ST segment deviation noted during stress.  T wave inversion was noted during stress in the V2 leads, beginning at 0 minutes of stress. T wave inversion persisted.  Defect 1: There is a large defect of severe severity present in the basal inferior, basal inferolateral, mid inferior, mid inferolateral, apical inferior and apical lateral location.  Findings consistent with prior Inferior / Inferolateral myocardial infarction. There is no evidence of ischemia .  Nuclear stress EF: 22%.  This is a high risk study.  The left ventricular ejection fraction is severely decreased (<30%).    US Abdomen:  1. Mild gallbladder wall thickening and a tiny amount of pericholecystic fluid identified. No stones, sludge, or Murphy's sign. If there is concern for acute cholecystitis, a HIDA scan could better evaluate. 2. Bilateral renal cysts.  ECHO 6/29:  LVEF 30-35%, mildly dilated with global hypokinesis, mild MR, normal biatrial size, moderate TR, RVSP 47 mmHg, dilated IVC, no pericardial effusion.  CTA Chest: 6/28: FINDINGS: Evaluation of this exam is limited due to respiratory motion artifact.  There is an area of ground-glass on nodular airspace opacity involving the posterior  apical segment of the left upper lobe most compatible with pneumonia. Right apical nodular and ground-glass density is also noted to a lesser degree. There is mild diffuse interstitial prominence which may represent mild edema. There is moderate left and small right pleural effusions. No pneumothorax. The central airways are patent.  There is minimal atherosclerotic calcification of the thoracic aorta.  There is apparent peripheral filling defect in the left pulmonary artery at the bifurcation to the upper and lower lobe (series 5 image 118 - 131). This may represent pulmonary artery embolus or mass effect and compression or invasion by a centrally located left hilar mass. An ill-defined 2.0 x 1.5 cm density in the left hilar region (series 5, image 107) may represent combination of adenopathy and consolidated lung or a centrally located/partially obstructing mass. Follow-up with CT is recommended to ensure resolution of the left lung opacity and exclude an underlying hilar mass. No other central pulmonary artery embolus identified. Evaluation of the distal branches of the pulmonary arteries is limited due to respiratory motion artifact and suboptimal visualization.  There is no cardiomegaly or pericardial effusion. Left hilar adenopathy. The esophagus is grossly unremarkable with there is a small left thyroid hypodense nodule. Ultrasound may provide better evaluation.  There is no axillary adenopathy. There is diffuse subcutaneous soft tissue stranding and edema the chest wall no fluid collection. There is degenerative changes of the spine. No acute fracture.  There is retrograde flow of contrast from the right atrium into the IVC suggestive of a degree of right cardiac dysfunction. A 1.9 cm left renal upper pole hypodense lesion is not well characterized. This is stable compared to the CT dated 11/18/2013 and likely represents a cyst.  Review of the MIP images confirms  the above findings.  IMPRESSION: Left upper lobe pneumonia. An ill-defined centrally located left hilar density may represent combination of adenopathy and consolidated portion of the lung versus a hilar mass. Follow-up is recommended to document resolution of the airspace opacity and exclude underlying hilar mass.  Apparent peripheral filling defect in the left pulmonary artery at the bifurcation of the left upper and left lower lobe branches likely represent extrinsic compression on the vessel by the hilar adenopathy/ mass or invasion of a left hilar lesion into the adjacent pulmonary artery. Acute pulmonary embolus is less likely, given peripheral orientation, but not entirely excluded.  Interstitial edema and bilateral pleural effusions,  left greater right.  Bilateral LE Venous Dopplers 6/29: - Findings consistent with acute deep vein thrombosis involving the  right femoral vein, right popliteal vein, right posterior tibial  veins, and right peroneal veins. - No evidence of deep vein or superficial thrombosis involving the  left lower extremity and right common femoral vein. - No evidence of Baker's cyst on the right or left.  Leland Her, DO 10/04/2015, 7:41 AM PGY-1, [facility] FPTS Intern pager: [phone], text pages welcome

## 2015-10-04 NOTE — Consult Note (Signed)
[address]       [place] [address]             [phone]      Cardiothoracic Surgery Consultation  Reason for Consult: Severe multi-vessel coronary artery disease Referring Physician:  Dr. Sherren Mocha  Kaylin Schellenberg is an 60 y.o. male.  HPI:   The patient is a 60 year old smoker ( 1 ppd) who is a wine distributor and drinks ETOH daily who reports more pain in his right calf than usual for about a month. He had prior trauma to that leg in the past and some chronic pain related to that. A few weeks ago he developed subjective fever, non-productive cough and shortness of breath and was treated with Levaquin po for LUL infiltrate. He says that this helped a little but then he worsened with malaise, fatigue, DOE, orthopnea, PND and progressive swelling in his legs, abdomen and chest, neck and face. He has had some chest pain with coughing but no exertional chest discomfort. He was sent to the ER by PCP and CTA showed no PE but did show LUL segmental infiltrate suggesting pneumonia. There was some mild bilateral interstitial edema and left hilar fullness with some compression of pulmonary arterial branches. It is unclear if this is adenopathy or neoplasm. WBC was 20 K on admission. A lower extremity venous study showed an acute extensive DVT from the right femoral vein down to the posterior tibial and peroneal veins. 2D echo showed an EF of 30-35% with diffuse hypokinesis, mild LV dilation. There was moderate TR and a dilated IVC consistent with an elevated CVP. He had a cath showing 80% mid LAD stenosis, 100% D2 occlusion, 90% proximal to mid RCA, and 80% proximal to mid LCX. PAP was 58/28 with an RV EDP of 15 mm Hg.    Past Medical History  Diagnosis Date  . Diverticulosis     shown on CT    Past Surgical History  Procedure Laterality Date  . Knee surgery    . Cardiac catheterization N/A 10/04/2015    Procedure: Right/Left Heart Cath and Coronary Angiography;   Surgeon: Sherren Mocha, MD;  Location: [facility];  Service: Cardiovascular;  Laterality: N/A;    No family history on file.  Social History:  reports that he has been smoking.  He does not have any smokeless tobacco history on file. He reports that he drinks alcohol. He reports that he does not use illicit drugs.  Allergies:  Allergies  Allergen Reactions  . Bee Venom Anaphylaxis    Medications:  I have reviewed the patient's current medications. Prior to Admission:  Prescriptions prior to admission  Medication Sig Dispense Refill Last Dose  . aspirin EC 81 MG tablet Take 81 mg by mouth daily.   09/26/2015 at pm  . glipiZIDE (GLUCOTROL) 5 MG tablet Take 1 tablet (5 mg total) by mouth 2 (two) times daily before a meal. 60 tablet 3 09/27/2015 at am  . ibuprofen (ADVIL,MOTRIN) 200 MG tablet Take 600 mg by mouth every 6 (six) hours as needed for moderate pain. Reported on 09/27/2015   Past Week at Unknown time  . OVER THE COUNTER MEDICATION Emergen-C packets: Mix and drink one packet by mouth daily   09/26/2015 at am   Scheduled: . aspirin EC  81 mg Oral Daily  . carvedilol  6.25 mg Oral BID WC  . folic acid  1 mg Oral Daily  . furosemide  40 mg Intravenous TID  . insulin aspart  0-15 Units Subcutaneous TID WC  . insulin aspart  0-5 Units Subcutaneous QHS  . insulin glargine  7 Units Subcutaneous Daily  . lisinopril  5 mg Oral Daily  . multivitamin with minerals  1 tablet Oral Daily  . potassium chloride  20 mEq Oral Daily  . sodium chloride flush  3 mL Intravenous Q12H  . sodium chloride flush  3 mL Intravenous Q12H  . sodium chloride flush  3 mL Intravenous Q12H  . sodium chloride flush  3 mL Intravenous Q12H  . spironolactone  12.5 mg Oral Daily  . thiamine  100 mg Oral Daily   Continuous: . heparin 2,700 Units/hr (10/04/15 1800)   IRC:VELFYB chloride, sodium chloride, sodium chloride, acetaminophen, benzonatate, diphenhydrAMINE, hydrALAZINE, ondansetron **OR**  ondansetron (ZOFRAN) IV, polyethylene glycol, sodium chloride flush, sodium chloride flush, sodium chloride flush Anti-infectives    Start     Dose/Rate Route Frequency Ordered Stop   09/29/15 1200  levofloxacin (LEVAQUIN) tablet 750 mg     750 mg Oral Daily before lunch 09/29/15 1131 10/01/15 1700   09/28/15 0600  vancomycin (VANCOCIN) IVPB 1000 mg/200 mL premix  Status:  Discontinued     1,000 mg 200 mL/hr over 60 Minutes Intravenous Every 12 hours 09/27/15 2030 09/29/15 1130   09/28/15 0000  piperacillin-tazobactam (ZOSYN) IVPB 3.375 g  Status:  Discontinued     3.375 g 12.5 mL/hr over 240 Minutes Intravenous Every 8 hours 09/27/15 2030 09/29/15 1130   09/27/15 1800  vancomycin (VANCOCIN) 2,000 mg in sodium chloride 0.9 % 500 mL IVPB     2,000 mg 250 mL/hr over 120 Minutes Intravenous  Once 09/27/15 1731 09/27/15 2001   09/27/15 1745  piperacillin-tazobactam (ZOSYN) IVPB 3.375 g     3.375 g 100 mL/hr over 30 Minutes Intravenous  Once 09/27/15 1731 09/27/15 1833      Results for orders placed or performed during the hospital encounter of 09/27/15 (from the past 48 hour(s))  Glucose, capillary     Status: Abnormal   Collection Time: 10/02/15  8:48 PM  Result Value Ref Range   Glucose-Capillary 187 (H) 65 - 99 mg/dL  Heparin level (unfractionated)     Status: None   Collection Time: 10/03/15  2:16 AM  Result Value Ref Range   Heparin Unfractionated 0.38 0.30 - 0.70 IU/mL    Comment:        IF HEPARIN RESULTS ARE BELOW EXPECTED VALUES, AND PATIENT DOSAGE HAS BEEN CONFIRMED, SUGGEST FOLLOW UP TESTING OF ANTITHROMBIN III LEVELS.   Basic metabolic panel     Status: Abnormal   Collection Time: 10/03/15  2:16 AM  Result Value Ref Range   Sodium 133 (L) 135 - 145 mmol/L   Potassium 3.7 3.5 - 5.1 mmol/L   Chloride 95 (L) 101 - 111 mmol/L   CO2 28 22 - 32 mmol/L   Glucose, Bld 179 (H) 65 - 99 mg/dL   BUN 19 6 - 20 mg/dL   Creatinine, Ser 0.80 0.61 - 1.24 mg/dL   Calcium 8.4 (L)  8.9 - 10.3 mg/dL   GFR calc non Af Amer >60 >60 mL/min   GFR calc Af Amer >60 >60 mL/min    Comment: (NOTE) The eGFR has been calculated using the CKD EPI equation. This calculation has not been validated in all clinical situations. eGFR's persistently <60 mL/min signify possible Chronic Kidney Disease.    Anion gap 10 5 - 15  Protime-INR  Status: Abnormal   Collection Time: 10/03/15  2:16 AM  Result Value Ref Range   Prothrombin Time 18.0 (H) 11.6 - 15.2 seconds   INR 1.48 0.00 - 1.49  Glucose, capillary     Status: Abnormal   Collection Time: 10/03/15  6:36 AM  Result Value Ref Range   Glucose-Capillary 177 (H) 65 - 99 mg/dL  Ferritin     Status: None   Collection Time: 10/03/15  9:01 AM  Result Value Ref Range   Ferritin 252 24 - 336 ng/mL  Glucose, capillary     Status: Abnormal   Collection Time: 10/03/15 12:04 PM  Result Value Ref Range   Glucose-Capillary 287 (H) 65 - 99 mg/dL   Comment 1 Notify RN   Glucose, capillary     Status: Abnormal   Collection Time: 10/03/15  4:37 PM  Result Value Ref Range   Glucose-Capillary 234 (H) 65 - 99 mg/dL   Comment 1 Notify RN   Glucose, capillary     Status: Abnormal   Collection Time: 10/03/15  9:29 PM  Result Value Ref Range   Glucose-Capillary 225 (H) 65 - 99 mg/dL  Basic metabolic panel     Status: Abnormal   Collection Time: 10/04/15 12:32 AM  Result Value Ref Range   Sodium 132 (L) 135 - 145 mmol/L   Potassium 3.6 3.5 - 5.1 mmol/L   Chloride 95 (L) 101 - 111 mmol/L   CO2 29 22 - 32 mmol/L   Glucose, Bld 283 (H) 65 - 99 mg/dL   BUN 16 6 - 20 mg/dL   Creatinine, Ser 0.88 0.61 - 1.24 mg/dL   Calcium 8.4 (L) 8.9 - 10.3 mg/dL   GFR calc non Af Amer >60 >60 mL/min   GFR calc Af Amer >60 >60 mL/min    Comment: (NOTE) The eGFR has been calculated using the CKD EPI equation. This calculation has not been validated in all clinical situations. eGFR's persistently <60 mL/min signify possible Chronic Kidney Disease.     Anion gap 8 5 - 15  Protime-INR     Status: Abnormal   Collection Time: 10/04/15 12:32 AM  Result Value Ref Range   Prothrombin Time 17.2 (H) 11.6 - 15.2 seconds   INR 1.40 0.00 - 1.49  CBC     Status: Abnormal   Collection Time: 10/04/15 12:32 AM  Result Value Ref Range   WBC 11.8 (H) 4.0 - 10.5 K/uL   RBC 4.93 4.22 - 5.81 MIL/uL   Hemoglobin 15.4 13.0 - 17.0 g/dL   HCT 46.5 39.0 - 52.0 %   MCV 94.3 78.0 - 100.0 fL   MCH 31.2 26.0 - 34.0 pg   MCHC 33.1 30.0 - 36.0 g/dL   RDW 14.3 11.5 - 15.5 %   Platelets 305 150 - 400 K/uL  Heparin level (unfractionated)     Status: Abnormal   Collection Time: 10/04/15  3:39 AM  Result Value Ref Range   Heparin Unfractionated 0.29 (L) 0.30 - 0.70 IU/mL    Comment:        IF HEPARIN RESULTS ARE BELOW EXPECTED VALUES, AND PATIENT DOSAGE HAS BEEN CONFIRMED, SUGGEST FOLLOW UP TESTING OF ANTITHROMBIN III LEVELS.   Protime-INR     Status: Abnormal   Collection Time: 10/04/15  3:39 AM  Result Value Ref Range   Prothrombin Time 16.9 (H) 11.6 - 15.2 seconds   INR 1.36 0.00 - 9.16  Basic metabolic panel     Status: Abnormal   Collection  Time: 10/04/15  3:39 AM  Result Value Ref Range   Sodium 132 (L) 135 - 145 mmol/L   Potassium 3.8 3.5 - 5.1 mmol/L   Chloride 95 (L) 101 - 111 mmol/L   CO2 28 22 - 32 mmol/L   Glucose, Bld 268 (H) 65 - 99 mg/dL   BUN 16 6 - 20 mg/dL   Creatinine, Ser 0.77 0.61 - 1.24 mg/dL   Calcium 8.6 (L) 8.9 - 10.3 mg/dL   GFR calc non Af Amer >60 >60 mL/min   GFR calc Af Amer >60 >60 mL/min    Comment: (NOTE) The eGFR has been calculated using the CKD EPI equation. This calculation has not been validated in all clinical situations. eGFR's persistently <60 mL/min signify possible Chronic Kidney Disease.    Anion gap 9 5 - 15  Glucose, capillary     Status: Abnormal   Collection Time: 10/04/15  6:53 AM  Result Value Ref Range   Glucose-Capillary 212 (H) 65 - 99 mg/dL  I-STAT 3, venous blood gas (G3P V)     Status:  Abnormal   Collection Time: 10/04/15 11:35 AM  Result Value Ref Range   pH, Ven 7.381 (H) 7.250 - 7.300   pCO2, Ven 52.0 (H) 45.0 - 50.0 mmHg   pO2, Ven 33.0 31.0 - 45.0 mmHg   Bicarbonate 30.8 (H) 20.0 - 24.0 mEq/L   TCO2 32 0 - 100 mmol/L   O2 Saturation 61.0 %   Acid-Base Excess 4.0 (H) 0.0 - 2.0 mmol/L   Patient temperature HIDE    Sample type VENOUS    Comment NOTIFIED PHYSICIAN   I-STAT 3, venous blood gas (G3P V)     Status: Abnormal   Collection Time: 10/04/15 11:37 AM  Result Value Ref Range   pH, Ven 7.388 (H) 7.250 - 7.300   pCO2, Ven 51.4 (H) 45.0 - 50.0 mmHg   pO2, Ven 34.0 31.0 - 45.0 mmHg   Bicarbonate 31.0 (H) 20.0 - 24.0 mEq/L   TCO2 33 0 - 100 mmol/L   O2 Saturation 63.0 %   Acid-Base Excess 4.0 (H) 0.0 - 2.0 mmol/L   Patient temperature HIDE    Sample type VENOUS    Comment NOTIFIED PHYSICIAN   I-STAT 3, arterial blood gas (G3+)     Status: Abnormal   Collection Time: 10/04/15 11:45 AM  Result Value Ref Range   pH, Arterial 7.303 (L) 7.350 - 7.450   pCO2 arterial 48.0 (H) 35.0 - 45.0 mmHg   pO2, Arterial 66.0 (L) 80.0 - 100.0 mmHg   Bicarbonate 23.8 20.0 - 24.0 mEq/L   TCO2 25 0 - 100 mmol/L   O2 Saturation 90.0 %   Acid-base deficit 3.0 (H) 0.0 - 2.0 mmol/L   Patient temperature HIDE    Sample type ARTERIAL   Glucose, capillary     Status: Abnormal   Collection Time: 10/04/15  4:56 PM  Result Value Ref Range   Glucose-Capillary 290 (H) 65 - 99 mg/dL   Comment 1 Notify RN     Dg Chest 2 View  10/03/2015  CLINICAL DATA:  I50.9 (ICD-10-CM) - CHF (congestive heart failure) (HCC) J18.9 (ICD-10-CM) - PNA (pneumonia)Cough  cp EXAM: CHEST  2 VIEW COMPARISON:  09/27/2015 FINDINGS: Since the prior exam, worsened consolidation has developed at the left lung base now obscuring the hemidiaphragm and portions of the left heart border. Consolidation in the left upper lobe is similar to the prior exam. The right lung remains essentially clear. No  convincing pleural  effusion.  No pneumothorax. Cardiac silhouette is normal in size. No mediastinal or hilar masses or convincing adenopathy. IMPRESSION: 1. Worsened multifocal pneumonia on the left when compared with the prior study. There is increased consolidation in the left lower lobe, likely involving portions of the posterior left upper lobe lingula. Left upper lobe consolidation is without significant change. Electronically Signed   By: Lajean Manes M.D.   On: 10/03/2015 08:58    Review of Systems  Constitutional: Positive for fever and malaise/fatigue. Negative for chills and weight loss.  HENT: Negative.   Eyes: Negative.   Respiratory: Positive for cough and shortness of breath. Negative for hemoptysis and sputum production.        Chest pain with cough  Cardiovascular: Positive for orthopnea, leg swelling and PND.  Gastrointestinal: Negative.   Genitourinary: Negative.   Musculoskeletal: Negative.   Skin: Negative.   Neurological: Negative.   Endo/Heme/Allergies: Negative.   Psychiatric/Behavioral: Negative.    Blood pressure 128/81, pulse 86, temperature 97.6 F (36.4 C), temperature source Oral, resp. rate 18, height 6' (1.829 m), weight 99.156 kg (218 lb 9.6 oz), SpO2 98 %. Physical Exam  Constitutional: He is oriented to person, place, and time. He appears well-developed and well-nourished. No distress.  HENT:  Head: Normocephalic and atraumatic.  Mouth/Throat: Oropharynx is clear and moist.  Eyes: Conjunctivae and EOM are normal. Pupils are equal, round, and reactive to light.  Neck: Normal range of motion. Neck supple. JVD present. No thyromegaly present.  Cardiovascular: Normal rate, regular rhythm and normal heart sounds.  Exam reveals no gallop and no friction rub.   No murmur heard. Respiratory: Effort normal. He has no wheezes. He exhibits no tenderness.  Rales over LUL  GI: Soft. Bowel sounds are normal. He exhibits no distension and no mass. There is no tenderness.    Musculoskeletal: Normal range of motion.  Bilateral leg edema is worse on the right. Old scar right lower leg. Some varicosities in both lower legs.  Lymphadenopathy:    He has no cervical adenopathy.  Neurological: He is alert and oriented to person, place, and time. He has normal strength. No cranial nerve deficit or sensory deficit.  Skin: Skin is warm and dry.  Psychiatric: He has a normal mood and affect.   Sherren Mocha, MD (Primary)      Procedures    Right/Left Heart Cath and Coronary Angiography    Conclusion     Ost 2nd Diag to 2nd Diag lesion, 100% stenosed.  Mid LAD lesion, 80% stenosed.  Prox RCA to Mid RCA lesion, 90% stenosed.  Ost RPDA to RPDA lesion, 75% stenosed.  Prox Cx lesion, 50% stenosed.  Prox Cx to Mid Cx lesion, 80% stenosed.  1. Severe 3 vessel CAD involving high-grade stenoses of the mid-RCA, mid-LAD, and mid-circumflex, and total occlusion of the second diagonal 2. Known severe LV systolic dysfunction  Complex situation in patient with acute DVT, possible lung malignancy, acute heart failure, and pneumonia. He also has newly diagnosed diabetes with HgB A1C > 10 mg/dL. I discussed his case with the primary service, pulmonary service, and placed a consultation with TCTS for consideration of CABG. Plans for FU CT imaging after completed treatment for pneumonia per pulmonary service. Will resume IV heparin post-cath until we decide on best revascularization strategy. If he is not a candidate for CABG, could do PCI with drug-eluting stents or with Biofreedom stents (throught the Leaders-Free trial requiring 30 days of DAPT per protocol).  Indications    Acute systolic heart failure (HCC) [I50.21 (ICD-10-CM)]    Technique and Indications    INDICATION: Newly diagnosed cardiomyopathy. Complex presentation with acute DVT, respiratory failure with compression/invasion of the left PA and pneumonia. Also with acute systolic heart failure.    PROCEDURAL DETAILS: There was an indwelling IV in a right antecubital vein. Using normal sterile technique, the IV was changed out for a 5 Fr brachial sheath over a 0.018 inch wire. The right wrist was then prepped, draped, and anesthetized with 1% lidocaine. Using the modified Seldinger technique a 5/6 French Slender sheath was placed in the right radial artery. Intra-arterial verapamil was administered through the radial artery sheath. IV heparin was administered after a JR4 catheter was advanced into the central aorta. A Swan-Ganz catheter was used for the right heart catheterization. Standard protocol was followed for recording of right heart pressures and sampling of oxygen saturations. Fick cardiac output was calculated. Standard Judkins catheters were used for selective coronary angiography. LV pressure is recorded with a pigtail catheter. There were no immediate procedural complications. The patient was transferred to the post catheterization recovery area for further monitoring.  During this procedure the patient is administered a total of Versed 2 mg and Fentanyl 25 mg to achieve and maintain moderate conscious sedation. The patient's heart rate, blood pressure, and oxygen saturation are monitored continuously during the procedure. The period of conscious sedation is 35 minutes, of which I was present face-to-face 100% of this time.   Estimated blood loss <50 mL. There were no immediate complications during the procedure.    Coronary Findings    Dominance: Right   Left Anterior Descending   . Mid LAD lesion, 80% stenosed. The LAD has a long diffuse mid-vessel lesion, estimated at 75-80%   . Second Diagonal Branch   The second diagonal is occluded and fills late from left-to-left collaterals. 2nd Diag filled by collaterals from Lat 3rd Diag.   Colon Flattery 2nd Diag to 2nd Diag lesion, 100% stenosed.     Left Circumflex   . Prox Cx lesion, 50% stenosed.   . Prox Cx to Mid Cx lesion, 80%  stenosed. Diffuse.     Right Coronary Artery   . Prox RCA to Mid RCA lesion, 90% stenosed. Eccentric.   . Right Posterior Descending Artery   . Ost RPDA to RPDA lesion, 75% stenosed. Diffuse eccentric.      Coronary Diagrams    Diagnostic Diagram            Implants     No implant documentation for this case.    PACS Images    Show images for Cardiac catheterization     Link to Procedure Log    Procedure Log      Hemo Data       Most Recent Value   Fick Cardiac Output  4.86 L/min   Fick Cardiac Output Index  2.19 (L/min)/BSA   RA A Wave  15 mmHg   RA V Wave  11 mmHg   RA Mean  9 mmHg   RV Systolic Pressure  56 mmHg   RV Diastolic Pressure  2 mmHg   RV EDP  15 mmHg   PA Systolic Pressure  58 mmHg   PA Diastolic Pressure  28 mmHg   PA Mean  41 mmHg   PW A Wave  20 mmHg   PW V Wave  26 mmHg   PW Mean  21 mmHg   AO Systolic Pressure  115 mmHg   AO Diastolic Pressure  78 mmHg   AO Mean  95 mmHg   LV Systolic Pressure  841 mmHg   LV Diastolic Pressure  8 mmHg   LV EDP  22 mmHg   Arterial Occlusion Pressure Extended Systolic Pressure  324 mmHg   Arterial Occlusion Pressure Extended Diastolic Pressure  71 mmHg   Arterial Occlusion Pressure Extended Mean Pressure  91 mmHg   Left Ventricular Apex Extended Systolic Pressure  401 mmHg   Left Ventricular Apex Extended Diastolic Pressure  70 mmHg   Left Ventricular Apex Extended EDP Pressure  73 mmHg   QP/QS  1   TPVR Index  18.73 HRUI   TSVR Index  43.39 HRUI   PVR SVR Ratio  0.23   TPVR/TSVR Ratio  0.43    Result status: Final result      *[facility]*  [address], [place] [address]  [phone]  ------------------------------------------------------------------- Transthoracic  Echocardiography  Patient: Hannibal, Skalla MR #: [id number] Study Date: 09/28/2015 Gender: M Age: 50 Height: 182.9 cm Weight: 111.8 kg BSA: 2.42 m^2 Pt. Status: Room: 3E02C  ADMITTING Walden, Ovando, Inpatient SONOGRAPHER Madelin Rear, RDCS  cc:  ------------------------------------------------------------------- LV EF: 30% - 35%  ------------------------------------------------------------------- Indications: Dyspnea 786.09.  ------------------------------------------------------------------- History: PMH: No prior cardiac history.  ------------------------------------------------------------------- Study Conclusions  - Left ventricle: The cavity size was mildly dilated. Wall  thickness was normal. Systolic function was moderately to  severely reduced. The estimated ejection fraction was in the  range of 30% to 35%. Diffuse hypokinesis. The study is not  technically sufficient to allow evaluation of LV diastolic  function. - Mitral valve: Mildly thickened leaflets . There was mild  regurgitation. - Left atrium: The atrium was normal in size. - Right ventricle: Poorly visualized. - Right atrium: The atrium was normal in size. - Tricuspid valve: There was moderate regurgitation. - Pulmonary arteries: PA peak pressure: 47 mm Hg (S). - Inferior vena cava: The vessel was dilated. The respirophasic  diameter changes were blunted (< 50%), consistent with elevated  central venous pressure. - Pericardium, extracardiac: There was no pericardial effusion.  Impressions:  - LVEF 30-35%, mildly dilated with global hypokinesis, mild MR,  normal biatrial size, moderate TR, RVSP 47 mmHg, dilated IVC, no  pericardial effusion.  Transthoracic echocardiography. M-mode, complete 2D,  spectral Doppler, and color Doppler. Birthdate: Patient birthdate: [birth year]. Age: Patient is 60 yr old. Sex: Gender: male. BMI: 33.4 kg/m^2. Blood pressure: 148/95 Patient status: Inpatient. Study date: Study date: 09/28/2015. Study time: 10:15 AM. Location: Echo laboratory.  -------------------------------------------------------------------  ------------------------------------------------------------------- Left ventricle: The cavity size was mildly dilated. Wall thickness was normal. Systolic function was moderately to severely reduced. The estimated ejection fraction was in the range of 30% to 35%. Diffuse hypokinesis. The study is not technically sufficient to allow evaluation of LV diastolic function.  ------------------------------------------------------------------- Aortic valve: Poorly visualized. Doppler: There was no stenosis. There was no significant regurgitation.  ------------------------------------------------------------------- Aorta: Aortic root: The aortic root was normal in size. Ascending aorta: The ascending aorta was normal in size.  ------------------------------------------------------------------- Mitral valve: Mildly thickened leaflets . Doppler: There was mild regurgitation. Peak gradient (D): 4 mm Hg.  ------------------------------------------------------------------- Left atrium: The atrium was normal in size.  ------------------------------------------------------------------- Right ventricle: Poorly visualized.  ------------------------------------------------------------------- Tricuspid valve: Doppler: There was moderate regurgitation.  ------------------------------------------------------------------- Pulmonary artery: The main pulmonary artery was normal-sized.  -------------------------------------------------------------------  Right atrium: The atrium was normal in  size.  ------------------------------------------------------------------- Pericardium: There was no pericardial effusion.  ------------------------------------------------------------------- Systemic veins: Inferior vena cava: The vessel was dilated. The respirophasic diameter changes were blunted (< 50%), consistent with elevated central venous pressure.  ------------------------------------------------------------------- Measurements  Left ventricle Value Reference LV ID, ED, PLAX chordal (H) 57.5 mm 43 - 52 LV ID, ES, PLAX chordal (H) 49.2 mm 23 - 38 LV fx shortening, PLAX chordal (L) 14 % >=29 LV PW thickness, ED 7.67 mm --------- IVS/LV PW ratio, ED 0.93 <=1.3 LV ejection fraction, 1-p A4C 29 % --------- LV end-diastolic volume, 2-p 885 ml --------- LV end-systolic volume, 2-p 027 ml --------- LV ejection fraction, 2-p 31 % --------- Stroke volume, 2-p 58 ml --------- LV end-diastolic volume/bsa, 2-p 78 ml/m^2 --------- LV end-systolic volume/bsa, 2-p 54 ml/m^2 --------- Stroke volume/bsa, 2-p 24 ml/m^2 --------- LV e&', lateral 11.7 cm/s --------- LV E/e&', lateral 8.48 --------- LV e&', medial 4.79 cm/s --------- LV E/e&', medial 20.71 --------- LV e&', average 8.25 cm/s --------- LV E/e&', average 12.03 ---------  Ventricular septum Value Reference IVS thickness, ED 7.11 mm  ---------  LVOT Value Reference LVOT ID, S 19 mm --------- LVOT area 2.84 cm^2 ---------  Aorta Value Reference Aortic root ID, ED 34 mm --------- Ascending aorta ID, A-P, S 31 mm ---------  Left atrium Value Reference LA ID, A-P, ES 39 mm --------- LA ID/bsa, A-P 1.61 cm/m^2 <=2.2 LA volume, S 68.5 ml --------- LA volume/bsa, S 28.3 ml/m^2 --------- LA volume, ES, 1-p A4C 61.8 ml --------- LA volume/bsa, ES, 1-p A4C 25.6 ml/m^2 --------- LA volume, ES, 1-p A2C 72.5 ml --------- LA volume/bsa, ES, 1-p A2C 30 ml/m^2 ---------  Mitral valve Value Reference Mitral E-wave peak velocity 99.2 cm/s --------- Mitral deceleration time (L) 98 ms 150 - 230 Mitral peak gradient, D 4 mm Hg ---------  Pulmonary arteries Value Reference PA pressure, S, DP (H) 47 mm Hg <=30  Tricuspid valve Value Reference Tricuspid regurg peak velocity 323 cm/s --------- Tricuspid peak RV-RA gradient 42 mm Hg ---------  Systemic veins Value Reference Estimated CVP 5 mm Hg ---------  Right ventricle Value Reference TAPSE 14.5 mm --------- RV pressure, S, DP (H) 47 mm Hg  <=30  Legend: (L) and (H) mark values outside specified reference range.  ------------------------------------------------------------------- Prepared and Electronically Authenticated by  Lyman Bishop MD 2017-06-29T14:49:27     Assessment/Plan:  He has severe 3-vessel coronary disease with moderate LV dysfunction and moderate pulmonary hypertension presenting with progressive shortness of breath, fatigue and edema and extensive right lower extremity DVT. It is not clear why he developed a DVT but he was apparently fairly ill at home and at bedrest for while.  His course prior to development of these symptoms sounded more like pneumonia with a LUL infiltrate on CXR. Chest CTA clearly shows an extensive segmental LUL infiltrate with probable hilar adenopathy that may be reactive. There was also mild interstitial edema on CT consistent with CHF. It is always possible that the LUL infiltrate and hilar fullness could be related to a neoplastic process but pneumonia is more likely. Neoplasm would increase his risk of DVT. His coronary artery disease is amenable to CABG but I would not do CABG while he has an acute untreated DVT and presumed pneumonia. He would need to have at least 6 weeks of treatment for his DVT and complete resolution of his LUL infiltrate before going through CABG to minimize his risk. I think PCI would be a reasonable  option since these lesions look suitable for that and it would allow immediate revascularization. It is not possible to tell how much of his LV dysfunction is due to ischemia and how much may be non-ischemic. His presenting symptoms suggest acute on chronic systolic heart failure and could be expect to improve with revascularization. I reviewed the echo, cath and CT finding with the patient and his wife and answered their questions. He will need a repeat CT after treatment for pneumonia to be sure that this pulmonary process clears up and if it doesn't he will need  further workup to rule out neoplasm which sometimes can look just like pneumonia.  Gaye Pollack 10/04/2015, 6:51 PM

## 2015-10-04 NOTE — Progress Notes (Signed)
ANTICOAGULATION CONSULT NOTE - Follow Up Consult  Pharmacy Consult for heparin Indication: DVT  Labs:  Recent Labs  10/02/15 0520 10/02/15 0828 10/03/15 0216 10/04/15 0032 10/04/15 0339  HGB  --   --   --  15.4  --   HCT  --   --   --  46.5  --   PLT  --   --   --  305  --   LABPROT  --  17.9* 18.0* 17.2*  --   INR  --  1.47 1.48 1.40  --   HEPARINUNFRC 0.46  --  0.38  --  0.29*  CREATININE 0.98  --  0.80 0.88 0.77    Assessment: 60yo male now slightly subtherapeutic on heparin after several levels at goal though had been trending down.  Goal of Therapy:  Heparin level 0.3-0.7 units/ml   Plan:  Will increase heparin gtt by <10% to 2700 units/hr and check level in 6hr.  Vernard GamblesVeronda Arvine Clayburn, PharmD, BCPS  10/04/2015,4:39 AM

## 2015-10-04 NOTE — Interval H&P Note (Signed)
Cath Lab Visit (complete for each Cath Lab visit)  Clinical Evaluation Leading to the Procedure:   ACS: No.  Non-ACS:    Anginal Classification: No Symptoms  Anti-ischemic medical therapy: Minimal Therapy (1 class of medications)  Non-Invasive Test Results: No non-invasive testing performed  Prior CABG: No previous CABG      History and Physical Interval Note:  10/04/2015 10:55 AM  Robert Hampton  has presented today for surgery, with the diagnosis of cp  The various methods of treatment have been discussed with the patient and family. After consideration of risks, benefits and other options for treatment, the patient has consented to  Procedure(s): Right/Left Heart Cath and Coronary Angiography (N/A) as a surgical intervention .  The patient's history has been reviewed, patient examined, no change in status, stable for surgery.  I have reviewed the patient's chart and labs.  Questions were answered to the patient's satisfaction.     Tonny Bollmanooper, Zykeriah Mathia

## 2015-10-04 NOTE — Progress Notes (Signed)
TR band removed at 1600x2 guaze with tegaderm was applied. Site remains level 0. R pulses remain 2+, extremity is warm to touch, normal color for ethnicity. Vital signs stable. Pt instructed to not put any pressure or use arm. Pt instructed to call if arm begins to bleed. Pt verbalized understanding. Will continue to monitor pt.  Jilda PandaBethany Ashle Stief RN

## 2015-10-04 NOTE — Progress Notes (Signed)
Inpatient Diabetes Program Recommendations  AACE/ADA: New Consensus Statement on Inpatient Glycemic Control (2015)  Target Ranges:  Prepandial:   less than 140 mg/dL      Peak postprandial:   less than 180 mg/dL (1-2 hours)      Critically ill patients:  140 - 180 mg/dL   Lab Results  Component Value Date   GLUCAP 212* 10/04/2015   HGBA1C 10.4* 09/27/2015  Results for Jesus GeneraSCHAUER, Kayton (MRN [id number]) as of 10/04/2015 12:59  Ref. Range 10/03/2015 12:04 10/03/2015 16:37 10/03/2015 21:29 10/04/2015 06:53  Glucose-Capillary Latest Ref Range: 65-99 mg/dL 409287 (H) 811234 (H) 914225 (H) 212 (H)    Review of Glycemic Control  Needs insulin adjustment.  Inpatient Diabetes Program Recommendations:    Increase Lantus to 10 units QHS Increase Novolog to resistant tidwc and hs.  Will need affordable insulin at discharge. Continue to follow.  Thank you. Ailene Ardshonda Eve Rey, RD, LDN, CDE Inpatient Diabetes Coordinator [phone]

## 2015-10-04 NOTE — Progress Notes (Signed)
Patient Name: Jesus GeneraJonathan Rzepka Date of Encounter: 10/04/2015   SUBJECTIVE  Fatigued. SOB stable. No chest pain.   CURRENT MEDS . aspirin EC  81 mg Oral Daily  . carvedilol  6.25 mg Oral BID WC  . folic acid  1 mg Oral Daily  . furosemide  40 mg Intravenous TID  . insulin aspart  0-15 Units Subcutaneous TID WC  . insulin aspart  0-5 Units Subcutaneous QHS  . insulin glargine  7 Units Subcutaneous Daily  . lisinopril  5 mg Oral Daily  . multivitamin with minerals  1 tablet Oral Daily  . potassium chloride  20 mEq Oral Daily  . sodium chloride flush  3 mL Intravenous Q12H  . sodium chloride flush  3 mL Intravenous Q12H  . sodium chloride flush  3 mL Intravenous Q12H  . spironolactone  12.5 mg Oral Daily  . thiamine  100 mg Oral Daily    OBJECTIVE  Filed Vitals:   10/03/15 0514 10/03/15 1131 10/03/15 1956 10/04/15 0612  BP: 142/87 119/70 123/87 135/84  Pulse: 89 81 86 93  Temp: 98.6 F (37 C) 97.5 F (36.4 C) 97.7 F (36.5 C) 98.5 F (36.9 C)  TempSrc: Oral Oral Oral Oral  Resp: 18 18 18 18   Height:      Weight: 220 lb 9.6 oz (100.064 kg)   218 lb 9.6 oz (99.156 kg)  SpO2: 98% 96% 98% 96%    Intake/Output Summary (Last 24 hours) at 10/04/15 0908 Last data filed at 10/04/15 0842  Gross per 24 hour  Intake 2249.21 ml  Output   3350 ml  Net -1100.79 ml   Filed Weights   10/02/15 0512 10/03/15 0514 10/04/15 0612  Weight: 226 lb 3.2 oz (102.604 kg) 220 lb 9.6 oz (100.064 kg) 218 lb 9.6 oz (99.156 kg)    PHYSICAL EXAM  General: Pleasant, NAD. Neuro: Alert and oriented X 3. Moves all extremities spontaneously. Psych: Normal affect. HEENT:  Normal  Neck: Supple without bruits or JVD. Lungs:  Resp regular and unlabored, CTA. Heart: RRR no s3, s4, or murmurs. Abdomen: Soft, non-tender, non-distended, BS + x 4.  Extremities: No clubbing, cyanosis. Mild R leg edema. DP/PT/Radials 2+ and equal bilaterally.  Accessory Clinical Findings  CBC  Recent Labs  10/04/15 0032  WBC 11.8*  HGB 15.4  HCT 46.5  MCV 94.3  PLT 305   Basic Metabolic Panel  Recent Labs  10/04/15 0032 10/04/15 0339  NA 132* 132*  K 3.6 3.8  CL 95* 95*  CO2 29 28  GLUCOSE 283* 268*  BUN 16 16  CREATININE 0.88 0.77  CALCIUM 8.4* 8.6*   Liver Function Tests No results for input(s): AST, ALT, ALKPHOS, BILITOT, PROT, ALBUMIN in the last 72 hours. No results for input(s): LIPASE, AMYLASE in the last 72 hours. Cardiac Enzymes No results for input(s): CKTOTAL, CKMB, CKMBINDEX, TROPONINI in the last 72 hours. BNP Invalid input(s): POCBNP D-Dimer No results for input(s): DDIMER in the last 72 hours. Hemoglobin A1C No results for input(s): HGBA1C in the last 72 hours. Fasting Lipid Panel No results for input(s): CHOL, HDL, LDLCALC, TRIG, CHOLHDL, LDLDIRECT in the last 72 hours. Thyroid Function Tests No results for input(s): TSH, T4TOTAL, T3FREE, THYROIDAB in the last 72 hours.  Invalid input(s): FREET3  TELE  Sinus rhythm   Radiology/Studies  Dg Chest 2 View  10/03/2015  CLINICAL DATA:  I50.9 (ICD-10-CM) - CHF (congestive heart failure) (HCC) J18.9 (ICD-10-CM) - PNA (pneumonia)Cough  cp EXAM: CHEST  2 VIEW COMPARISON:  09/27/2015 FINDINGS: Since the prior exam, worsened consolidation has developed at the left lung base now obscuring the hemidiaphragm and portions of the left heart border. Consolidation in the left upper lobe is similar to the prior exam. The right lung remains essentially clear. No convincing pleural effusion.  No pneumothorax. Cardiac silhouette is normal in size. No mediastinal or hilar masses or convincing adenopathy. IMPRESSION: 1. Worsened multifocal pneumonia on the left when compared with the prior study. There is increased consolidation in the left lower lobe, likely involving portions of the posterior left upper lobe lingula. Left upper lobe consolidation is without significant change. Electronically Signed   By: Amie Portland M.D.   On:  10/03/2015 08:58   Dg Chest 2 View  09/27/2015  CLINICAL DATA:  Cough and shortness of breath, progressing EXAM: CHEST  2 VIEW COMPARISON:  September 19, 2015 FINDINGS: There is now airspace consolidation throughout the posterior segment of the left upper lobe. There is slight atelectasis in the left base. The right lung is clear. Heart size and pulmonary vascularity are within normal limits. No adenopathy. There is a benign exostosis arising from the inferior anterior right first rib. There is degenerative change in the thoracic spine. IMPRESSION: Airspace consolidation throughout the posterior segment of the left upper lobe consistent with pneumonia. Mild left base atelectasis. Right lung clear. Stable cardiac silhouette. These results will be called to the ordering clinician or representative by the Radiologist Assistant, and communication documented in the PACS or zVision Dashboard. Electronically Signed   By: Bretta Bang III M.D.   On: 09/27/2015 14:35   Dg Chest 2 View  09/19/2015  CLINICAL DATA:  Two days of dyspnea, shortness of breath, and fatigue EXAM: CHEST  2 VIEW COMPARISON:  None in PACs FINDINGS: The lungs are adequately inflated. The interstitial markings are increased bilaterally. Coarse lung markings in the infrahilar regions bilaterally are noted as well. The heart is top-normal in size. The pulmonary vascularity is normal. There is multilevel degenerative disc disease of the thoracic spine with calcification of portions of the anterior longitudinal ligament. There is calcification associated with the first costosternal junction. IMPRESSION: Probable chronic bronchitis with superimposed interstitial pneumonia. There is no alveolar infiltrate nor CHF. Followup PA and lateral chest X-ray is recommended in 3-4 weeks following trial of antibiotic therapy to ensure resolution and exclude underlying malignancy. Electronically Signed   By: David  [place] M.D.   On: 09/19/2015 16:19   Ct Angio  Chest Pe W Or Wo Contrast  09/28/2015  CLINICAL DATA:  60 year old male with shortness of breath. EXAM: CT ANGIOGRAPHY CHEST WITH CONTRAST TECHNIQUE: Multidetector CT imaging of the chest was performed using the standard protocol during bolus administration of intravenous contrast. Multiplanar CT image reconstructions and MIPs were obtained to evaluate the vascular anatomy. CONTRAST:  100 cc Isovue 370 COMPARISON:  Chest radiograph dated 09/27/2015 FINDINGS: Evaluation of this exam is limited due to respiratory motion artifact. There is an area of ground-glass on nodular airspace opacity involving the posterior apical segment of the left upper lobe most compatible with pneumonia. Right apical nodular and ground-glass density is also noted to a lesser degree. There is mild diffuse interstitial prominence which may represent mild edema. There is moderate left and small right pleural effusions. No pneumothorax. The central airways are patent. There is minimal atherosclerotic calcification of the thoracic aorta. There is apparent peripheral filling defect in the left pulmonary artery at the bifurcation to the upper and lower  lobe (series 5 image 118 - 131). This may represent pulmonary artery embolus or mass effect and compression or invasion by a centrally located left hilar mass. An ill-defined 2.0 x 1.5 cm density in the left hilar region (series 5, image 107) may represent combination of adenopathy and consolidated lung or a centrally located/partially obstructing mass. Follow-up with CT is recommended to ensure resolution of the left lung opacity and exclude an underlying hilar mass. No other central pulmonary artery embolus identified. Evaluation of the distal branches of the pulmonary arteries is limited due to respiratory motion artifact and suboptimal visualization. There is no cardiomegaly or pericardial effusion. Left hilar adenopathy. The esophagus is grossly unremarkable with there is a small left thyroid  hypodense nodule. Ultrasound may provide better evaluation. There is no axillary adenopathy. There is diffuse subcutaneous soft tissue stranding and edema the chest wall no fluid collection. There is degenerative changes of the spine. No acute fracture. There is retrograde flow of contrast from the right atrium into the IVC suggestive of a degree of right cardiac dysfunction. A 1.9 cm left renal upper pole hypodense lesion is not well characterized. This is stable compared to the CT dated 11/18/2013 and likely represents a cyst. Review of the MIP images confirms the above findings. IMPRESSION: Left upper lobe pneumonia. An ill-defined centrally located left hilar density may represent combination of adenopathy and consolidated portion of the lung versus a hilar mass. Follow-up is recommended to document resolution of the airspace opacity and exclude underlying hilar mass. Apparent peripheral filling defect in the left pulmonary artery at the bifurcation of the left upper and left lower lobe branches likely represent extrinsic compression on the vessel by the hilar adenopathy/ mass or invasion of a left hilar lesion into the adjacent pulmonary artery. Acute pulmonary embolus is less likely, given peripheral orientation, but not entirely excluded. Interstitial edema and bilateral pleural effusions, left greater right. These results were called by telephone at the time of interpretation on 09/28/2015 at 12:52 am to Dr. Wende Mott, who verbally acknowledged these results. Electronically Signed   By: Elgie Collard M.D.   On: 09/28/2015 00:56   US Abdomen Complete  09/28/2015  CLINICAL DATA:  Transaminitis EXAM: ABDOMEN ULTRASOUND COMPLETE COMPARISON:  CT scan November 18, 2013 FINDINGS: Gallbladder: The gallbladder wall is mildly thickened measuring 5 mm. There is slight pericholecystic fluid. No stones, sludge, or Murphy's sign. Common bile duct: Diameter: 3 mm Liver: No focal lesion identified. Within normal limits in  parenchymal echogenicity. IVC: No abnormality visualized. Pancreas: The pancreas was not visualized. Spleen: Size and appearance within normal limits. Right Kidney: Length: 13.4 cm. Known renal cysts. The largest measures 2.3 cm, not changed since August 2015. Left Kidney: Length: 12.9 cm.  Known renal cysts. Abdominal aorta: No aneurysm visualized. Other findings: None. IMPRESSION: 1. Mild gallbladder wall thickening and a tiny amount of pericholecystic fluid identified. No stones, sludge, or Murphy's sign. If there is concern for acute cholecystitis, a HIDA scan could better evaluate. 2. Bilateral renal cysts. Electronically Signed   By: Gerome Sam III M.D   On: 09/28/2015 18:02   Nm Myocar Multi W/spect W/wall Motion / Ef  10/02/2015   There was no ST segment deviation noted during stress.  T wave inversion was noted during stress in the V2 leads, beginning at 0 minutes of stress. T wave inversion persisted.  Defect 1: There is a large defect of severe severity present in the basal inferior, basal inferolateral, mid inferior, mid inferolateral,  apical inferior and apical lateral location.  Findings consistent with prior Inferior / Inferolateral myocardial infarction. There is no evidence of ischemia .  Nuclear stress EF: 22%.  This is a high risk study.  The left ventricular ejection fraction is severely decreased (<30%).     ASSESSMENT AND PLAN   1. Acute systolic CHF - echo showed LV EF of 30-35%, mildly dilated with global hypokinesis, RVSP 47 mm Hg. Myoview high risk but no evidence of ischemia. Suspects non-ischemic cardiomyopathy. For L & R cath today. If need of PCI --> consider need of DOAC due to DVT and potential pulmonary workup in future.  - Diuresed negative 15.7 L. Weight down 32lb.  -  Continue ASA, BB, ACE, spironolactone. He is on IV lasix 40mg  TID. He appears euvolemic. Consider switch to po.   2. CAP and Left hilar density by chest CT - Mass vs adenopathy. CT follow up   In several months.   3. RLE DVT - On heparin. Plan to change to oral DOAC post cath.    Signed, Bhagat,Bhavinkumar PA-C Pager [phone]  Agree with note by Chelsea AusVin Bhagat PA-C  Pt for R/L heart cath today. No change in Sx. Will need ultimately to be on DOAC for DVT (BMS if intervention required), as well as pulm w/u for mass.   Runell GessJonathan J. Berry, M.D., FACP, [facility] HospitalFACC, Earl LagosFAHA, [facility] PLLCFSCAI [facility] [address] [place], [place]  [id number]  [phone] 10/04/2015 9:53 AM

## 2015-10-05 DIAGNOSIS — I519 Heart disease, unspecified: Secondary | ICD-10-CM

## 2015-10-05 DIAGNOSIS — I2583 Coronary atherosclerosis due to lipid rich plaque: Secondary | ICD-10-CM

## 2015-10-05 DIAGNOSIS — I2510 Atherosclerotic heart disease of native coronary artery without angina pectoris: Secondary | ICD-10-CM | POA: Insufficient documentation

## 2015-10-05 LAB — GLUCOSE, CAPILLARY
GLUCOSE-CAPILLARY: 227 mg/dL — AB (ref 65–99)
GLUCOSE-CAPILLARY: 206 mg/dL — AB (ref 65–99)
Glucose-Capillary: 227 mg/dL — ABNORMAL HIGH (ref 65–99)
Glucose-Capillary: 269 mg/dL — ABNORMAL HIGH (ref 65–99)

## 2015-10-05 LAB — CBC
HEMATOCRIT: 36.2 % — AB (ref 39.0–52.0)
HEMOGLOBIN: 12.1 g/dL — AB (ref 13.0–17.0)
MCH: 31.7 pg (ref 26.0–34.0)
MCHC: 33.4 g/dL (ref 30.0–36.0)
MCV: 94.8 fL (ref 78.0–100.0)
PLATELETS: 267 10*3/uL (ref 150–400)
RBC: 3.82 MIL/uL — AB (ref 4.22–5.81)
RDW: 14.1 % (ref 11.5–15.5)
WBC: 8.6 10*3/uL (ref 4.0–10.5)

## 2015-10-05 LAB — BASIC METABOLIC PANEL
ANION GAP: 8 (ref 5–15)
ANION GAP: 9 (ref 5–15)
BUN: 19 mg/dL (ref 6–20)
BUN: 14 mg/dL (ref 6–20)
CALCIUM: 8.9 mg/dL (ref 8.9–10.3)
CHLORIDE: 95 mmol/L — AB (ref 101–111)
CO2: 28 mmol/L (ref 22–32)
CO2: 27 mmol/L (ref 22–32)
Calcium: 8.8 mg/dL — ABNORMAL LOW (ref 8.9–10.3)
Chloride: 97 mmol/L — ABNORMAL LOW (ref 101–111)
Creatinine, Ser: 0.84 mg/dL (ref 0.61–1.24)
Creatinine, Ser: 0.82 mg/dL (ref 0.61–1.24)
GFR calc Af Amer: >60 mL/min (ref >60)
GFR calc Af Amer: >60 mL/min (ref >60)
GFR calc non Af Amer: >60 mL/min (ref >60)
GLUCOSE: 214 mg/dL — AB (ref 65–99)
GLUCOSE: 236 mg/dL — AB (ref 65–99)
POTASSIUM: 3.7 mmol/L (ref 3.5–5.1)
Potassium: 3.9 mmol/L (ref 3.5–5.1)
Sodium: 133 mmol/L — ABNORMAL LOW (ref 135–145)
Sodium: 131 mmol/L — ABNORMAL LOW (ref 135–145)

## 2015-10-05 LAB — PROTIME-INR
INR: 1.25 (ref 0.00–1.49)
Prothrombin Time: 15.9 seconds — ABNORMAL HIGH (ref 11.6–15.2)

## 2015-10-05 LAB — HEPARIN LEVEL (UNFRACTIONATED)
HEPARIN UNFRACTIONATED: 0.75 [IU]/mL — AB (ref 0.30–0.70)
Heparin Unfractionated: 0.34 IU/mL (ref 0.30–0.70)

## 2015-10-05 MED ORDER — SODIUM CHLORIDE 0.9% FLUSH
3.0000 mL | Freq: Two times a day (BID) | INTRAVENOUS | Status: DC
  Administered 2015-10-06: 3 mL via INTRAVENOUS

## 2015-10-05 MED ORDER — ASPIRIN 81 MG PO CHEW
81.0000 mg | CHEWABLE_TABLET | ORAL | Status: AC
  Administered 2015-10-06: 81 mg via ORAL
  Filled 2015-10-05: qty 1

## 2015-10-05 MED ORDER — SODIUM CHLORIDE 0.9% FLUSH: 3.0000 mL | INTRAVENOUS | Status: DC | PRN

## 2015-10-05 MED ORDER — SODIUM CHLORIDE 0.9 % WEIGHT BASED INFUSION: 1.0000 mL/kg/h | INTRAVENOUS | Status: DC

## 2015-10-05 MED ORDER — SODIUM CHLORIDE 0.9 % IV SOLN: 250.0000 mL | INTRAVENOUS | Status: DC | PRN

## 2015-10-05 MED ORDER — SODIUM CHLORIDE 0.9 % WEIGHT BASED INFUSION
3.0000 mL/kg/h | INTRAVENOUS | Status: DC
  Administered 2015-10-06: 3 mL/kg/h via INTRAVENOUS

## 2015-10-05 NOTE — Research (Signed)
[facility] FREE Research Study Informed Consent   Subject Name: Robert Hampton  Subject met inclusion and exclusion criteria.  The informed consent form, study requirements and expectations were reviewed with the subject and questions and concerns were addressed prior to the signing of the consent form.  The subject verbalized understanding of the trial requirements.  The subject agreed to participate in the trial and signed the informed consent at 1300 on 10/05/2015.  The informed consent was obtained prior to performance of any protocol-specific procedures for the subject.  A copy of the signed informed consent was given to the subject and a copy was placed in the subject's medical record.  [name] 10/05/2015, 1:50 PM

## 2015-10-05 NOTE — Progress Notes (Signed)
ANTICOAGULATION CONSULT NOTE - Follow Up Consult  Pharmacy Consult for Heparin  Indication: DVT, severe 3 vessel disease s/p cath  Allergies  Allergen Reactions  . Bee Venom Anaphylaxis   Patient Measurements: Height: 6' (182.9 cm) Weight: 214 lb 4.8 oz (97.206 kg) (Scale A) IBW/kg (Calculated) : 77.6  Vital Signs: Temp: 98.2 F (36.8 C) (07/06 0500) Temp Source: Oral (07/06 0500) BP: 127/80 mmHg (07/06 1122) Pulse Rate: 83 (07/06 1122)  Labs:  Recent Labs  10/03/15 0216 10/04/15 0032 10/04/15 0339 10/05/15 0231 10/05/15 1204  HGB  --  15.4  --   --   --   HCT  --  46.5  --   --   --   PLT  --  305  --   --   --   LABPROT 18.0* 17.2* 16.9*  --   --   INR 1.48 1.40 1.36  --   --   HEPARINUNFRC 0.38  --  0.29* 0.34 0.75*  CREATININE 0.80 0.88 0.77 0.84  --     Estimated Creatinine Clearance: 115.8 mL/min (by C-G formula based on Cr of 0.84).   Assessment: [age] yoM admitted 09/27/2015 found to have new RLE DVT and severe 3-vessel coronary disease that has been maintained on heparin. Confirmation HL obtained this afternoon was 0.75 after 0.34 on the same rate 8 hours earlier.  RN believes that it was drawn correctly but unable to confirm. CBC has remains stable.    Goal of Therapy:  Heparin level 0.3-0.7 units/ml Monitor platelets by anticoagulation protocol: Yes   Plan:  -Decrease heparin to 2600 units/hr -2100 HL  -Daily HL and CBC  Pollyann SamplesAndy Rylie Knierim, PharmD, BCPS 10/05/2015, 1:38 PM Pager: [phone]

## 2015-10-05 NOTE — Progress Notes (Signed)
ANTICOAGULATION CONSULT NOTE - Follow Up Consult  Pharmacy Consult for Heparin  Indication: DVT, severe 3 vessel disease s/p cath  Allergies  Allergen Reactions  . Bee Venom Anaphylaxis   Patient Measurements: Height: 6' (182.9 cm) Weight: 218 lb 9.6 oz (99.156 kg) IBW/kg (Calculated) : 77.6  Vital Signs: Temp: 98.9 F (37.2 C) (07/06 0006) Temp Source: Oral (07/06 0006) BP: 141/90 mmHg (07/06 0006) Pulse Rate: 92 (07/06 0006)  Labs:  Recent Labs  10/03/15 0216 10/04/15 0032 10/04/15 0339 10/05/15 0231  HGB  --  15.4  --   --   HCT  --  46.5  --   --   PLT  --  305  --   --   LABPROT 18.0* 17.2* 16.9*  --   INR 1.48 1.40 1.36  --   HEPARINUNFRC 0.38  --  0.29* 0.34  CREATININE 0.80 0.88 0.77 0.84    Estimated Creatinine Clearance: 116.9 mL/min (by C-G formula based on Cr of 0.84).    Assessment: Heparin level is therapeutic after re-start s/p cath, pt has severe 3 vessel disease along with new DVT  Goal of Therapy:  Heparin level 0.3-0.7 units/ml Monitor platelets by anticoagulation protocol: Yes   Plan:  -Cont heparin at 2700 units/hr -1200 HL  Abran DukeLedford, Grant Henkes 10/05/2015,3:46 AM

## 2015-10-05 NOTE — Progress Notes (Signed)
Family Medicine Teaching Service Daily Progress Note Intern Pager: [phone]  Patient name: Robert Hampton Medical record number: [id number] Date of birth: May 08, 1955 Age: 60 y.o. Gender: male  Primary Care Provider: No PCP Per Patient Consultants: Pulmonology, Cardiology, CTS Code Status: FULL   Pt Overview and Major Events to Date:  6/28: Admit for SOB: CHF, PNA, DVT 6/30: Increased Lasix to TID 7/3: High risk Myoview study 7/5: cath showing severe 3 vessel CAD  Assessment and Plan: Robert Hampton is a 60 y.o. male presenting with shortness of breath and cough with LE swelling and jaundice, concern for PNA and also possible new CHF and also found to have RLE DVT. PMH is significant for T2DM, tobacco abuse.  New CAD. Found on cath (7/5) after high risk myoview study after patient presented with new acute CHF. Was found to have severe 3 vessel CAD involving high grade stenoses of mid-RCA, mid-LAD, mid-circumflex and total occlusion of the second diagonal.  - Per CTS, patient is not a candidate for CABG in the setting of acute DVT and hilar mass that is possible lung malignancy. - Per cardiology, PCI with Biofreedom stents planned for tomorrow (7/7) - Will need to be placed on ASA81 and Plavix for 1 month following stent placement  New Acute HFrEF with hepatic congestion:  BNP was 1788 in the ED. ECHO: with EF 30-35%, mildly dilated with global hypokinesis, RVSP 47mmHg, dilated IVC. Repeat BNP 1061.5 (7/01). UOP 3.7L yesterday, with neg output -14.7 since admission. Weight: 236lb >220lb. Creatinine stable at 0.77-0.88 (Baseline ~ 0.8). Myoview showed high risk study, EF 22%, large inferolateral scar with peri-infarct ischemia. Ferritin 252.  - Cardiology following - IV Lasix 40mg  TID - Lisinopril 5mg  daily (increased 6/30) - Coreg 6.2 BID (increased 7/3) - Spironolactone 12.5mg  daily   RLE DVT: Additionally unable to rule out PE on CTA chest. - Heparin per pharmacy  - Per  cardiology, keep on heparin drip for now in anticipation of ischemic work up  - Will eventually transition to DOAC in the setting of possible malignancy on CT chest   CAP: CTA chest with LUL PNA and L hilar density adenopathy vs consolidation vs hilar mass; peripheral filling defect in L pulm artery likely extrinsic compression by hilar adenopathy/mass or invasion of L hilar lesion into adjacent PA; shows some interstitial edema and bilateral pleural effusions L >R. Urine strep and Legionella neg. Leukocytosis continues to improve (20 >>15.4>13.1). Respiratory status improved as well, though still some wheezing after exertion. Maintaining sats in 90s on RA. Repeat CXR shows worsened multifocal pneumonia on L and worsened L lower lob consolidation with no change in L upper lobe consolidation. - Levaquin 750mg  (6/30) for total 7 days of antibiotics (last dose 7/04), complete - Blood cx: NGx3d   - Cardiac monitoring and continuous pulse ox  Elevated Liver Enzymes / Bilirubin / Jaundice 2/2 hepatic congestion, improving: Due to hepatic congestion in the setting of acute CHF exacerbation. No abdominal pain. AST 135 >> 35, ALT 525 >>178. Total bilirubin 1.9 >> 1.2. Drinks 2-3 glasses of wine per day. Abdominal US unremarkable. Hepatitis panel negative. TSH (nml), Ammonia (32), PT/INR (22/1.93).  - Continue to monitor  Possible Left Hilar Mass on Imaging: possible malignancy. Pulmonology consulted: will need follow up CT imaging with contrast if possible in 4-6 weeks to evaluate left hilar mass; f/u outpatient in 4-6 weeks.   Hyponatremia, improving: Na 128 > 135-133. Likely secondary to fluid overloaded and/or due to alcohol use.  - IV  Lasix - Continue to monitor  Type 2 DM: Diagnosed 1 week ago. A1c 10.4. CBG 177 this AM.  - Continue Lantus 7U - Moderate SSI - CBGs with meals and at bedtime  Hypertension: BP 113-137/68-97 - Lisinopril 5mg  - Coreg 6.2 mg BID - Spironolactone 12.5mg  daily  -  Hydralazine prn  Tobacco abuse: 3/4 PPD for 45 years - Declines Nicotine patch at this time.  Alcohol use: Drinks 2-3 glasses of wine per day. CIWA scores 0. - Continue to monitor  FEN/GI: Heart healthy diet, Miralax prn, SLIV Prophylaxis: Heparin drip  Disposition: pending medical improvement.   Subjective: Patient reports no change from yesterday with minimal SOB. States is anxious about his multiple medical conditions and wants to have PCI intervention done quickly. States continues to ambulate well and has some b/l LE swelling that he still feels is improving throughout hospital stay.  Objective: Temp:  [97.5 F (36.4 C)-98.9 F (37.2 C)] 98.2 F (36.8 C) (07/06 0500) Pulse Rate:  [0-187] 95 (07/06 0500) Resp:  [0-31] 18 (07/06 0500) BP: (112-147)/(70-106) 142/82 mmHg (07/06 0500) SpO2:  [0 %-99 %] 97 % (07/06 0500) Weight:  [214 lb 4.8 oz (97.206 kg)] 214 lb 4.8 oz (97.206 kg) (07/06 0500)   Physical Exam:  General: sitting in chair in NAD Neck: No JVD Cardiovascular: RRR, no murmurs appreciated Respiratory: No increased WOB. Crackles at lung base on R. Abdomen: Soft, NT, distended Extremities: 1+ pitting edema to midshin on R lower extremity and to ankle on L. No calf warmth, erythema, or TTP bilaterally.   Laboratory:  Recent Labs Lab 09/30/15 0508 10/01/15 0345 10/04/15 0032  WBC 15.4* 13.1* 11.8*  HGB 15.4 15.3 15.4  HCT 48.0 46.8 46.5  PLT 270 274 305    Recent Labs Lab 09/29/15 0222 09/30/15 0508  10/04/15 0032 10/04/15 0339 10/05/15 0231  NA 132* 132*  < > 132* 132* 133*  K 4.2 4.1  < > 3.6 3.8 3.9  CL 94* 93*  < > 95* 95* 97*  CO2 30 29  < > 29 28 28   BUN 14 14  < > 16 16 19   CREATININE 0.86 0.88  < > 0.88 0.77 0.84  CALCIUM 8.4* 8.4*  < > 8.4* 8.6* 8.9  PROT 5.3* 5.0*  --   --   --   --   BILITOT 1.9* 1.2  --   --   --   --   ALKPHOS 123 108  --   --   --   --   ALT 284* 178*  --   --   --   --   AST 61* 35  --   --   --   --   GLUCOSE  160* 208*  < > 283* 268* 214*  < > = values in this interval not displayed.Troponin: 0.07 > 0.06 BNP 1788  Imaging/Diagnostic Tests:  Right/Left Heart Cath and Coronary Angiography    Conclusion     Ost 2nd Diag to 2nd Diag lesion, 100% stenosed.  Mid LAD lesion, 80% stenosed.  Prox RCA to Mid RCA lesion, 90% stenosed.  Ost RPDA to RPDA lesion, 75% stenosed.  Prox Cx lesion, 50% stenosed.  Prox Cx to Mid Cx lesion, 80% stenosed.  1. Severe 3 vessel CAD involving high-grade stenoses of the mid-RCA, mid-LAD, and mid-circumflex, and total occlusion of the second diagonal 2. Known severe LV systolic dysfunction  Complex situation in patient with acute DVT, possible lung malignancy, acute heart failure,  and pneumonia. He also has newly diagnosed diabetes with HgB A1C > 10 mg/dL. I discussed his case with the primary service, pulmonary service, and placed a consultation with TCTS for consideration of CABG. Plans for FU CT imaging after completed treatment for pneumonia per pulmonary service. Will resume IV heparin post-cath until we decide on best revascularization strategy. If he is not a candidate for CABG, could do PCI with drug-eluting stents or with Biofreedom stents (throught the [employer]-Free trial requiring 30 days of DAPT per protocol).   Coronary Findings    Dominance: Right   Left Anterior Descending   . Mid LAD lesion, 80% stenosed. The LAD has a long diffuse mid-vessel lesion, estimated at 75-80%   . Second Diagonal Branch   The second diagonal is occluded and fills late from left-to-left collaterals. 2nd Diag filled by collaterals from Lat 3rd Diag.    Ost 2nd Diag to 2nd Diag lesion, 100% stenosed.     Left Circumflex   . Prox Cx lesion, 50% stenosed.   . Prox Cx to Mid Cx lesion, 80% stenosed. Diffuse.     Right Coronary Artery   . Prox RCA to Mid RCA lesion, 90% stenosed. Eccentric.   . Right Posterior Descending Artery   . Ost RPDA to RPDA lesion, 75%  stenosed. Diffuse eccentric.          Dg Chest 2 View  10/03/2015  CLINICAL DATA:  I50.9 (ICD-10-CM) - CHF (congestive heart failure) (HCC) J18.9 (ICD-10-CM) - PNA (pneumonia)Cough  cp EXAM: CHEST  2 VIEW COMPARISON:  09/27/2015 FINDINGS: Since the prior exam, worsened consolidation has developed at the left lung base now obscuring the hemidiaphragm and portions of the left heart border. Consolidation in the left upper lobe is similar to the prior exam. The right lung remains essentially clear. No convincing pleural effusion.  No pneumothorax. Cardiac silhouette is normal in size. No mediastinal or hilar masses or convincing adenopathy. IMPRESSION: 1. Worsened multifocal pneumonia on the left when compared with the prior study. There is increased consolidation in the left lower lobe, likely involving portions of the posterior left upper lobe lingula. Left upper lobe consolidation is without significant change. Electronically Signed   By: Amie Portland M.D.   On: 10/03/2015 08:58   Nm Myocar Multi W/spect W/wall Motion / Ef  10/02/2015   There was no ST segment deviation noted during stress.  T wave inversion was noted during stress in the V2 leads, beginning at 0 minutes of stress. T wave inversion persisted.  Defect 1: There is a large defect of severe severity present in the basal inferior, basal inferolateral, mid inferior, mid inferolateral, apical inferior and apical lateral location.  Findings consistent with prior Inferior / Inferolateral myocardial infarction. There is no evidence of ischemia .  Nuclear stress EF: 22%.  This is a high risk study.  The left ventricular ejection fraction is severely decreased (<30%).    US Abdomen:  1. Mild gallbladder wall thickening and a tiny amount of pericholecystic fluid identified. No stones, sludge, or Murphy's sign. If there is concern for acute cholecystitis, a HIDA scan could better evaluate. 2. Bilateral renal cysts.  ECHO 6/29:  LVEF  30-35%, mildly dilated with global hypokinesis, mild MR, normal biatrial size, moderate TR, RVSP 47 mmHg, dilated IVC, no pericardial effusion.  CTA Chest: 6/28: FINDINGS: Evaluation of this exam is limited due to respiratory motion artifact.  There is an area of ground-glass on nodular airspace opacity involving the posterior apical segment  of the left upper lobe most compatible with pneumonia. Right apical nodular and ground-glass density is also noted to a lesser degree. There is mild diffuse interstitial prominence which may represent mild edema. There is moderate left and small right pleural effusions. No pneumothorax. The central airways are patent.  There is minimal atherosclerotic calcification of the thoracic aorta.  There is apparent peripheral filling defect in the left pulmonary artery at the bifurcation to the upper and lower lobe (series 5 image 118 - 131). This may represent pulmonary artery embolus or mass effect and compression or invasion by a centrally located left hilar mass. An ill-defined 2.0 x 1.5 cm density in the left hilar region (series 5, image 107) may represent combination of adenopathy and consolidated lung or a centrally located/partially obstructing mass. Follow-up with CT is recommended to ensure resolution of the left lung opacity and exclude an underlying hilar mass. No other central pulmonary artery embolus identified. Evaluation of the distal branches of the pulmonary arteries is limited due to respiratory motion artifact and suboptimal visualization.  There is no cardiomegaly or pericardial effusion. Left hilar adenopathy. The esophagus is grossly unremarkable with there is a small left thyroid hypodense nodule. Ultrasound may provide better evaluation.  There is no axillary adenopathy. There is diffuse subcutaneous soft tissue stranding and edema the chest wall no fluid collection. There is degenerative changes of the spine. No  acute fracture.  There is retrograde flow of contrast from the right atrium into the IVC suggestive of a degree of right cardiac dysfunction. A 1.9 cm left renal upper pole hypodense lesion is not well characterized. This is stable compared to the CT dated 11/18/2013 and likely represents a cyst.  Review of the MIP images confirms the above findings.  IMPRESSION: Left upper lobe pneumonia. An ill-defined centrally located left hilar density may represent combination of adenopathy and consolidated portion of the lung versus a hilar mass. Follow-up is recommended to document resolution of the airspace opacity and exclude underlying hilar mass.  Apparent peripheral filling defect in the left pulmonary artery at the bifurcation of the left upper and left lower lobe branches likely represent extrinsic compression on the vessel by the hilar adenopathy/ mass or invasion of a left hilar lesion into the adjacent pulmonary artery. Acute pulmonary embolus is less likely, given peripheral orientation, but not entirely excluded.  Interstitial edema and bilateral pleural effusions, left greater right.  Bilateral LE Venous Dopplers 6/29: - Findings consistent with acute deep vein thrombosis involving the  right femoral vein, right popliteal vein, right posterior tibial  veins, and right peroneal veins. - No evidence of deep vein or superficial thrombosis involving the  left lower extremity and right common femoral vein. - No evidence of Baker's cyst on the right or left.  Leland HerElsia J Yoo, DO 10/05/2015, 6:36 AM PGY-1, [facility] FPTS Intern pager: [phone], text pages welcome

## 2015-10-05 NOTE — Progress Notes (Signed)
Subjective:  No CP. Continues to be SOB  Objective:  Temp:  [97.5 F (36.4 C)-98.9 F (37.2 C)] 98.2 F (36.8 C) (07/06 0500) Pulse Rate:  [0-187] 95 (07/06 0500) Resp:  [0-31] 18 (07/06 0500) BP: (112-147)/(70-106) 142/82 mmHg (07/06 0500) SpO2:  [0 %-99 %] 97 % (07/06 0500) Weight:  [214 lb 4.8 oz (97.206 kg)] 214 lb 4.8 oz (97.206 kg) (07/06 0500) Weight change: -4 lb 4.8 oz (-1.95 kg)  Intake/Output from previous day: 07/05 0701 - 07/06 0700 In: 1344.5 [P.O.:610; I.V.:734.5] Out: 4525 [Urine:4525]  Intake/Output from this shift: Total I/O In: 240 [P.O.:240] Out: 200 [Urine:200]  Physical Exam: General appearance: alert and no distress Neck: no adenopathy, no carotid bruit, no JVD, supple, symmetrical, trachea midline and thyroid not enlarged, symmetric, no tenderness/mass/nodules Lungs: Scattered crackles right base Heart: regular rate and rhythm, S1, S2 normal, no murmur, click, rub or gallop Extremities: extremities normal, atraumatic, no cyanosis or edema  Lab Results: Results for orders placed or performed during the hospital encounter of 09/27/15 (from the past 48 hour(s))  Glucose, capillary     Status: Abnormal   Collection Time: 10/03/15 12:04 PM  Result Value Ref Range   Glucose-Capillary 287 (H) 65 - 99 mg/dL   Comment 1 Notify RN   Glucose, capillary     Status: Abnormal   Collection Time: 10/03/15  4:37 PM  Result Value Ref Range   Glucose-Capillary 234 (H) 65 - 99 mg/dL   Comment 1 Notify RN   Glucose, capillary     Status: Abnormal   Collection Time: 10/03/15  9:29 PM  Result Value Ref Range   Glucose-Capillary 225 (H) 65 - 99 mg/dL  Basic metabolic panel     Status: Abnormal   Collection Time: 10/04/15 12:32 AM  Result Value Ref Range   Sodium 132 (L) 135 - 145 mmol/L   Potassium 3.6 3.5 - 5.1 mmol/L   Chloride 95 (L) 101 - 111 mmol/L   CO2 29 22 - 32 mmol/L   Glucose, Bld 283 (H) 65 - 99 mg/dL   BUN 16 6 - 20 mg/dL   Creatinine,  Ser 0.88 0.61 - 1.24 mg/dL   Calcium 8.4 (L) 8.9 - 10.3 mg/dL   GFR calc non Af Amer >60 >60 mL/min   GFR calc Af Amer >60 >60 mL/min    Comment: (NOTE) The eGFR has been calculated using the CKD EPI equation. This calculation has not been validated in all clinical situations. eGFR's persistently <60 mL/min signify possible Chronic Kidney Disease.    Anion gap 8 5 - 15  Protime-INR     Status: Abnormal   Collection Time: 10/04/15 12:32 AM  Result Value Ref Range   Prothrombin Time 17.2 (H) 11.6 - 15.2 seconds   INR 1.40 0.00 - 1.49  CBC     Status: Abnormal   Collection Time: 10/04/15 12:32 AM  Result Value Ref Range   WBC 11.8 (H) 4.0 - 10.5 K/uL   RBC 4.93 4.22 - 5.81 MIL/uL   Hemoglobin 15.4 13.0 - 17.0 g/dL   HCT 46.5 39.0 - 52.0 %   MCV 94.3 78.0 - 100.0 fL   MCH 31.2 26.0 - 34.0 pg   MCHC 33.1 30.0 - 36.0 g/dL   RDW 14.3 11.5 - 15.5 %   Platelets 305 150 - 400 K/uL  Heparin level (unfractionated)     Status: Abnormal   Collection Time: 10/04/15  3:39 AM  Result Value Ref Range  Heparin Unfractionated 0.29 (L) 0.30 - 0.70 IU/mL    Comment:        IF HEPARIN RESULTS ARE BELOW EXPECTED VALUES, AND PATIENT DOSAGE HAS BEEN CONFIRMED, SUGGEST FOLLOW UP TESTING OF ANTITHROMBIN III LEVELS.   Protime-INR     Status: Abnormal   Collection Time: 10/04/15  3:39 AM  Result Value Ref Range   Prothrombin Time 16.9 (H) 11.6 - 15.2 seconds   INR 1.36 0.00 - 4.19  Basic metabolic panel     Status: Abnormal   Collection Time: 10/04/15  3:39 AM  Result Value Ref Range   Sodium 132 (L) 135 - 145 mmol/L   Potassium 3.8 3.5 - 5.1 mmol/L   Chloride 95 (L) 101 - 111 mmol/L   CO2 28 22 - 32 mmol/L   Glucose, Bld 268 (H) 65 - 99 mg/dL   BUN 16 6 - 20 mg/dL   Creatinine, Ser 0.77 0.61 - 1.24 mg/dL   Calcium 8.6 (L) 8.9 - 10.3 mg/dL   GFR calc non Af Amer >60 >60 mL/min   GFR calc Af Amer >60 >60 mL/min    Comment: (NOTE) The eGFR has been calculated using the CKD EPI  equation. This calculation has not been validated in all clinical situations. eGFR's persistently <60 mL/min signify possible Chronic Kidney Disease.    Anion gap 9 5 - 15  Glucose, capillary     Status: Abnormal   Collection Time: 10/04/15  6:53 AM  Result Value Ref Range   Glucose-Capillary 212 (H) 65 - 99 mg/dL  I-STAT 3, venous blood gas (G3P V)     Status: Abnormal   Collection Time: 10/04/15 11:35 AM  Result Value Ref Range   pH, Ven 7.381 (H) 7.250 - 7.300   pCO2, Ven 52.0 (H) 45.0 - 50.0 mmHg   pO2, Ven 33.0 31.0 - 45.0 mmHg   Bicarbonate 30.8 (H) 20.0 - 24.0 mEq/L   TCO2 32 0 - 100 mmol/L   O2 Saturation 61.0 %   Acid-Base Excess 4.0 (H) 0.0 - 2.0 mmol/L   Patient temperature HIDE    Sample type VENOUS    Comment NOTIFIED PHYSICIAN   I-STAT 3, venous blood gas (G3P V)     Status: Abnormal   Collection Time: 10/04/15 11:37 AM  Result Value Ref Range   pH, Ven 7.388 (H) 7.250 - 7.300   pCO2, Ven 51.4 (H) 45.0 - 50.0 mmHg   pO2, Ven 34.0 31.0 - 45.0 mmHg   Bicarbonate 31.0 (H) 20.0 - 24.0 mEq/L   TCO2 33 0 - 100 mmol/L   O2 Saturation 63.0 %   Acid-Base Excess 4.0 (H) 0.0 - 2.0 mmol/L   Patient temperature HIDE    Sample type VENOUS    Comment NOTIFIED PHYSICIAN   I-STAT 3, arterial blood gas (G3+)     Status: Abnormal   Collection Time: 10/04/15 11:45 AM  Result Value Ref Range   pH, Arterial 7.303 (L) 7.350 - 7.450   pCO2 arterial 48.0 (H) 35.0 - 45.0 mmHg   pO2, Arterial 66.0 (L) 80.0 - 100.0 mmHg   Bicarbonate 23.8 20.0 - 24.0 mEq/L   TCO2 25 0 - 100 mmol/L   O2 Saturation 90.0 %   Acid-base deficit 3.0 (H) 0.0 - 2.0 mmol/L   Patient temperature HIDE    Sample type ARTERIAL   Glucose, capillary     Status: Abnormal   Collection Time: 10/04/15  4:56 PM  Result Value Ref Range   Glucose-Capillary 290 (  H) 65 - 99 mg/dL   Comment 1 Notify RN   Glucose, capillary     Status: Abnormal   Collection Time: 10/04/15  9:30 PM  Result Value Ref Range    Glucose-Capillary 215 (H) 65 - 99 mg/dL   Comment 1 Notify RN    Comment 2 Document in Chart   Heparin level (unfractionated)     Status: None   Collection Time: 10/05/15  2:31 AM  Result Value Ref Range   Heparin Unfractionated 0.34 0.30 - 0.70 IU/mL    Comment:        IF HEPARIN RESULTS ARE BELOW EXPECTED VALUES, AND PATIENT DOSAGE HAS BEEN CONFIRMED, SUGGEST FOLLOW UP TESTING OF ANTITHROMBIN III LEVELS.   Basic metabolic panel     Status: Abnormal   Collection Time: 10/05/15  2:31 AM  Result Value Ref Range   Sodium 133 (L) 135 - 145 mmol/L   Potassium 3.9 3.5 - 5.1 mmol/L   Chloride 97 (L) 101 - 111 mmol/L   CO2 28 22 - 32 mmol/L   Glucose, Bld 214 (H) 65 - 99 mg/dL   BUN 19 6 - 20 mg/dL   Creatinine, Ser 0.84 0.61 - 1.24 mg/dL   Calcium 8.9 8.9 - 10.3 mg/dL   GFR calc non Af Amer >60 >60 mL/min   GFR calc Af Amer >60 >60 mL/min    Comment: (NOTE) The eGFR has been calculated using the CKD EPI equation. This calculation has not been validated in all clinical situations. eGFR's persistently <60 mL/min signify possible Chronic Kidney Disease.    Anion gap 8 5 - 15  Glucose, capillary     Status: Abnormal   Collection Time: 10/05/15  5:37 AM  Result Value Ref Range   Glucose-Capillary 227 (H) 65 - 99 mg/dL   Comment 1 Notify RN    Comment 2 Document in Chart     Imaging: Imaging results have been reviewed  Assessment/Plan:   1. Principal Problem: 2.   Acute respiratory failure with hypoxia (HCC) 3. Active Problems: 4.   Uncontrolled type 2 DM- on oral agent  5.   Acute systolic CHF  6.   Anasarca 7.   Elevated liver enzymes 8.   CAP (community acquired pneumonia)-LUL 9.   Cardiomyopathy- etiology not yet determined- appears to be NICM 10.   ETOH abuse-daily drinker 11.   Smoker-quit "9 days ago" 12.   DVT-Rt lower extremity ([place]) 13.   Pulmonary hypertension ([place]) 14.   Abnormal CT scan of lung 15.   Abnormal nuclear stress test 16.   CHF (congestive  heart failure) ([place]) 17.   PNA (pneumonia) 18.   Time Spent Directly with Patient:  20 minutes  Length of Stay:  LOS: 8 days   Results of cath noted and films reviewed with DR. Cooper. I agree that given pts age and focality of lesions can probably be treated with DES (Biofreedom stent with 30 days of DAPT). He will need to be on a DOAC post cath and will need OP Pulm F/U of the lung mass.   Mousa, Prout 10/05/2015, 9:39 AM

## 2015-10-06 ENCOUNTER — Encounter (HOSPITAL_COMMUNITY)
Admission: EM | Disposition: A | Discharge status: Home / Self Care | Payer: Self-pay | Source: Home / Self Care | Attending: Family Medicine

## 2015-10-06 ENCOUNTER — Encounter (HOSPITAL_COMMUNITY): Payer: Self-pay | Admitting: Cardiovascular Disease

## 2015-10-06 DIAGNOSIS — I82409 Acute embolism and thrombosis of unspecified deep veins of unspecified lower extremity: Secondary | ICD-10-CM

## 2015-10-06 HISTORY — DX: Acute embolism and thrombosis of unspecified deep veins of unspecified lower extremity: I82.409

## 2015-10-06 HISTORY — PX: CORONARY STENT PLACEMENT: SHX1402

## 2015-10-06 HISTORY — PX: CARDIAC CATHETERIZATION: SHX172

## 2015-10-06 LAB — CBC
HEMATOCRIT: 50.2 % (ref 39.0–52.0)
HEMOGLOBIN: 16.5 g/dL (ref 13.0–17.0)
MCH: 31.2 pg (ref 26.0–34.0)
MCHC: 33.1 g/dL (ref 30.0–36.0)
MCV: 94.4 fL (ref 78.0–100.0)
Platelets: 353 10*3/uL (ref 150–400)
RBC: 5.32 MIL/uL (ref 4.22–5.81)
RDW: 14.3 % (ref 11.5–15.5)
WBC: 13 10*3/uL — ABNORMAL HIGH (ref 4.0–10.5)

## 2015-10-06 LAB — BASIC METABOLIC PANEL
Anion gap: 10 (ref 5–15)
BUN: 15 mg/dL (ref 6–20)
CHLORIDE: 96 mmol/L — AB (ref 101–111)
CO2: 28 mmol/L (ref 22–32)
CREATININE: 0.85 mg/dL (ref 0.61–1.24)
Calcium: 8.8 mg/dL — ABNORMAL LOW (ref 8.9–10.3)
GFR calc non Af Amer: >60 mL/min (ref >60)
Glucose, Bld: 139 mg/dL — ABNORMAL HIGH (ref 65–99)
POTASSIUM: 3.9 mmol/L (ref 3.5–5.1)
SODIUM: 134 mmol/L — AB (ref 135–145)

## 2015-10-06 LAB — GLUCOSE, CAPILLARY
GLUCOSE-CAPILLARY: 172 mg/dL — AB (ref 65–99)
GLUCOSE-CAPILLARY: 167 mg/dL — AB (ref 65–99)
GLUCOSE-CAPILLARY: 184 mg/dL — AB (ref 65–99)
GLUCOSE-CAPILLARY: 192 mg/dL — AB (ref 65–99)
Glucose-Capillary: 211 mg/dL — ABNORMAL HIGH (ref 65–99)

## 2015-10-06 LAB — HEPARIN LEVEL (UNFRACTIONATED): HEPARIN UNFRACTIONATED: 0.44 [IU]/mL (ref 0.30–0.70)

## 2015-10-06 LAB — POCT ACTIVATED CLOTTING TIME: Activated Clotting Time: 351 seconds

## 2015-10-06 LAB — TROPONIN I: Troponin I: 0.03 ng/mL (ref <0.03)

## 2015-10-06 SURGERY — CORONARY STENT INTERVENTION
Anesthesia: LOCAL

## 2015-10-06 MED ORDER — FENTANYL CITRATE (PF) 100 MCG/2ML IJ SOLN
INTRAMUSCULAR | Status: AC
  Filled 2015-10-06: qty 2

## 2015-10-06 MED ORDER — ANGIOPLASTY BOOK
Freq: Once | Status: AC
  Administered 2015-10-06: 21:00:00
  Filled 2015-10-06: qty 1

## 2015-10-06 MED ORDER — LIDOCAINE HCL (PF) 1 % IJ SOLN
INTRAMUSCULAR | Status: AC
  Filled 2015-10-06: qty 30

## 2015-10-06 MED ORDER — ACETAMINOPHEN 325 MG PO TABS: 650.0000 mg | ORAL_TABLET | ORAL | Status: DC | PRN

## 2015-10-06 MED ORDER — CLOPIDOGREL BISULFATE 75 MG PO TABS
75.0000 mg | ORAL_TABLET | Freq: Every day | ORAL | Status: DC
  Administered 2015-10-07: 10:00:00 75 mg via ORAL
  Filled 2015-10-06: qty 1

## 2015-10-06 MED ORDER — VERAPAMIL HCL 2.5 MG/ML IV SOLN
INTRAVENOUS | Status: DC | PRN
  Administered 2015-10-06: 10 mL via INTRA_ARTERIAL

## 2015-10-06 MED ORDER — SODIUM CHLORIDE 0.9 % IV SOLN
250.0000 mg | INTRAVENOUS | Status: DC | PRN
  Administered 2015-10-06 (×2): 1.75 mg/kg/h via INTRAVENOUS

## 2015-10-06 MED ORDER — CLOPIDOGREL BISULFATE 300 MG PO TABS
ORAL_TABLET | ORAL | Status: DC | PRN
  Administered 2015-10-06: 600 mg via ORAL

## 2015-10-06 MED ORDER — SODIUM CHLORIDE 0.9% FLUSH
3.0000 mL | Freq: Two times a day (BID) | INTRAVENOUS | Status: DC
  Administered 2015-10-06: 17:00:00 3 mL via INTRAVENOUS

## 2015-10-06 MED ORDER — FUROSEMIDE 40 MG PO TABS
40.0000 mg | ORAL_TABLET | Freq: Every day | ORAL | Status: DC
  Administered 2015-10-06 – 2015-10-07 (×2): 40 mg via ORAL
  Filled 2015-10-06 (×2): qty 1

## 2015-10-06 MED ORDER — RIVAROXABAN 20 MG PO TABS
20.0000 mg | ORAL_TABLET | Freq: Every day | ORAL | Status: DC
  Administered 2015-10-06: 22:00:00 20 mg via ORAL
  Filled 2015-10-06: qty 1

## 2015-10-06 MED ORDER — FENTANYL CITRATE (PF) 100 MCG/2ML IJ SOLN
INTRAMUSCULAR | Status: DC | PRN
  Administered 2015-10-06: 50 ug via INTRAVENOUS
  Administered 2015-10-06: 25 ug via INTRAVENOUS

## 2015-10-06 MED ORDER — IOPAMIDOL (ISOVUE-370) INJECTION 76%
INTRAVENOUS | Status: AC
  Filled 2015-10-06: qty 125

## 2015-10-06 MED ORDER — SODIUM CHLORIDE 0.9 % IV SOLN
INTRAVENOUS | Status: DC | PRN
  Administered 2015-10-06: 96 mL via INTRAVENOUS

## 2015-10-06 MED ORDER — CLOPIDOGREL BISULFATE 300 MG PO TABS
ORAL_TABLET | ORAL | Status: AC
  Filled 2015-10-06: qty 2

## 2015-10-06 MED ORDER — BIVALIRUDIN BOLUS VIA INFUSION - CUPID
INTRAVENOUS | Status: DC | PRN
  Administered 2015-10-06: 71.7 mg via INTRAVENOUS

## 2015-10-06 MED ORDER — VERAPAMIL HCL 2.5 MG/ML IV SOLN
INTRAVENOUS | Status: AC
  Filled 2015-10-06: qty 2

## 2015-10-06 MED ORDER — MIDAZOLAM HCL 2 MG/2ML IJ SOLN
INTRAMUSCULAR | Status: AC
  Filled 2015-10-06: qty 2

## 2015-10-06 MED ORDER — NITROGLYCERIN 1 MG/10 ML FOR IR/CATH LAB
INTRA_ARTERIAL | Status: AC
  Filled 2015-10-06: qty 10

## 2015-10-06 MED ORDER — HEPARIN (PORCINE) IN NACL 2-0.9 UNIT/ML-% IJ SOLN
INTRAMUSCULAR | Status: AC
  Filled 2015-10-06: qty 1000

## 2015-10-06 MED ORDER — SODIUM CHLORIDE 0.9% FLUSH: 3.0000 mL | INTRAVENOUS | Status: DC | PRN

## 2015-10-06 MED ORDER — ACTIVE PARTNERSHIP FOR HEALTH OF YOUR HEART BOOK
Freq: Once | Status: AC
  Administered 2015-10-06: 21:00:00
  Filled 2015-10-06: qty 1

## 2015-10-06 MED ORDER — MIDAZOLAM HCL 2 MG/2ML IJ SOLN
INTRAMUSCULAR | Status: DC | PRN
  Administered 2015-10-06: 2 mg via INTRAVENOUS
  Administered 2015-10-06: 1 mg via INTRAVENOUS

## 2015-10-06 MED ORDER — SODIUM CHLORIDE 0.9 % IV SOLN: INTRAVENOUS | Status: AC

## 2015-10-06 MED ORDER — SODIUM CHLORIDE 0.9 % IV SOLN: 250.0000 mL | INTRAVENOUS | Status: DC | PRN

## 2015-10-06 MED ORDER — BIVALIRUDIN 250 MG IV SOLR
INTRAVENOUS | Status: AC
  Filled 2015-10-06: qty 250

## 2015-10-06 MED ORDER — RIVAROXABAN 20 MG PO TABS: 20.0000 mg | ORAL_TABLET | Freq: Every day | ORAL | Status: DC

## 2015-10-06 MED ORDER — NITROGLYCERIN 1 MG/10 ML FOR IR/CATH LAB
INTRA_ARTERIAL | Status: DC | PRN
  Administered 2015-10-06 (×2): 150 ug
  Administered 2015-10-06: 150 ug via INTRACORONARY

## 2015-10-06 MED ORDER — OXYCODONE-ACETAMINOPHEN 5-325 MG PO TABS: 1.0000 | ORAL_TABLET | ORAL | Status: DC | PRN

## 2015-10-06 SURGICAL SUPPLY — 18 items
BALLN EUPHORA RX 2.5X15 (BALLOONS) ×2
BALLN ~~LOC~~ EUPHORA RX 3.5X12 (BALLOONS) ×2
BALLOON EUPHORA RX 2.5X15 (BALLOONS) IMPLANT
BALLOON ~~LOC~~ EUPHORA RX 3.5X12 (BALLOONS) IMPLANT
BIOFREEDOM 3.0X18 (Permanent Stent) ×1 IMPLANT
BIOFREEDOM 3.0X24 (Permanent Stent) ×1 IMPLANT
BIOFREEDOM 3.0X28 (Permanent Stent) ×2 IMPLANT
CATH VISTA GUIDE 6FR JR4 (CATHETERS) ×1 IMPLANT
CATH VISTA GUIDE 6FR XBLAD3.5 (CATHETERS) ×1 IMPLANT
DEVICE RAD COMP TR BAND LRG (VASCULAR PRODUCTS) ×1 IMPLANT
GLIDESHEATH SLEND SS 6F .021 (SHEATH) ×1 IMPLANT
KIT ENCORE 26 ADVANTAGE (KITS) ×2 IMPLANT
KIT HEART LEFT (KITS) ×2 IMPLANT
PACK CARDIAC CATHETERIZATION (CUSTOM PROCEDURE TRAY) ×2 IMPLANT
TRANSDUCER W/STOPCOCK (MISCELLANEOUS) ×2 IMPLANT
TUBING CIL FLEX 10 FLL-RA (TUBING) ×4 IMPLANT
WIRE COUGAR XT STRL 190CM (WIRE) ×1 IMPLANT
WIRE SAFE-T 1.5MM-J .035X260CM (WIRE) ×2 IMPLANT

## 2015-10-06 NOTE — Progress Notes (Signed)
Family Medicine Teaching Service Daily Progress Note Intern Pager: [phone]  Patient name: Robert Hampton Medical record number: [id number] Date of birth: Nov 16, 1955 Age: 60 y.o. Gender: male  Primary Care Provider: No PCP Per Patient Consultants: Pulmonology, Cardiology, CTS Code Status: FULL   Pt Overview and Major Events to Date:  6/28: Admit for SOB: CHF, PNA, DVT 6/30: Increased Lasix to TID 7/3: High risk Myoview study 7/5: cath showing severe 3 vessel CAD  Assessment and Plan: Brailon Don is a 60 y.o. male presenting with shortness of breath and cough with LE swelling and jaundice, concern for PNA and also possible new CHF and also found to have RLE DVT. PMH is significant for T2DM, tobacco abuse.  New CAD. Found on cath (7/5) after high risk myoview study after patient presented with new acute CHF. Was found to have severe 3 vessel CAD involving high grade stenoses of mid-RCA, mid-LAD, mid-circumflex and total occlusion of the second diagonal.  - Per CTS, patient is not a candidate for CABG in the setting of acute DVT and hilar mass that is possible lung malignancy. - Per cardiology, PCI with Biofreedom stents today (7/7) - Will need to be placed on ASA81 and Plavix for 1 month following stent placement  New Acute HFrEF with hepatic congestion:  BNP was 1788 in the ED. ECHO: with EF 30-35%, mildly dilated with global hypokinesis, RVSP , dilated IVC. Repeat BNP 1061.5 (7/01). UOP 3.7L yesterday, with neg output -14.7 since admission. Weight: 236lb >220lb. Creatinine stable at 0.77-0.88 (Baseline ~ 0.8). Myoview showed high risk study, EF 22%, large inferolateral scar with peri-infarct ischemia. Ferritin 252.  - Cardiology following - DC IV Lasix  TID and start Lasix  po qd - Lisinopril  daily (increased 6/30) - Coreg 6.2 BID (increased 7/3) - Spironolactone 12.5mg  daily   RLE DVT: Additionally unable to rule out PE on CTA chest. - Heparin per pharmacy   - Per cardiology, keep on heparin drip for now in anticipation of ischemic work up  - Needs DOAC on discharge   CAP: CTA chest with LUL PNA and L hilar density adenopathy vs consolidation vs hilar mass; peripheral filling defect in L pulm artery likely extrinsic compression by hilar adenopathy/mass or invasion of L hilar lesion into adjacent PA; shows some interstitial edema and bilateral pleural effusions L >R. Urine strep and Legionella neg. Leukocytosis continues to improve (20 >>15.4>13.1). Respiratory status improved as well, though still some wheezing after exertion. Maintaining sats in 90s on RA. Repeat CXR shows worsened multifocal pneumonia on L and worsened L lower lob consolidation with no change in L upper lobe consolidation. - Levaquin  (6/30) for total 7 days of antibiotics (last dose 7/04), complete - Blood cx: NGx3d   - Cardiac monitoring and continuous pulse ox  Elevated Liver Enzymes / Bilirubin / Jaundice 2/2 hepatic congestion, improving: Due to hepatic congestion in the setting of acute CHF exacerbation. No abdominal pain. AST 135 >> 35, ALT 525 >>178. Total bilirubin 1.9 >> 1.2. Drinks 2-3 glasses of wine per day. Abdominal US unremarkable. Hepatitis panel negative. TSH (nml), Ammonia (32), PT/INR (22/1.93).  - Continue to monitor  Possible Left Hilar Mass on Imaging: possible malignancy. Pulmonology consulted: will need follow up CT imaging with contrast if possible in 4-6 weeks to evaluate left hilar mass; f/u outpatient in 4-6 weeks.   Hyponatremia, improving: Na 128 > 135-133. Likely secondary to fluid overloaded and/or due to alcohol use.  - IV Lasix - anticipate deescalate to  po - Continue to monitor  Type 2 DM: Diagnosed 1 week ago. A1c 10.4. CBG 177 this AM.  - Continue Lantus 7U - Moderate SSI - CBGs with meals and at bedtime  Hypertension: BP 113-137/68-97 - Lisinopril 5mg  - Coreg 6.2 mg BID - Spironolactone 12.5mg  daily  - Hydralazine prn  Tobacco  abuse: 3/4 PPD for 45 years - Declines Nicotine patch at this time.  Alcohol use: Drinks 2-3 glasses of wine per day. CIWA scores 0. - Continue to monitor  FEN/GI: Heart healthy diet, Miralax prn, SLIV Prophylaxis: Heparin drip  Disposition: pending medical improvement.   Subjective:  Patient reports no change from yesterday with minimal SOB. States is anxious about PCI today and wants to go home soon.  Objective: Temp:  [98.2 F (36.8 C)-98.8 F (37.1 C)] 98.6 F (37 C) (07/07 0419) Pulse Rate:  [78-83] 78 (07/07 0419) Resp:  [18] 18 (07/07 0419) BP: (103-127)/(55-82) 117/75 mmHg (07/07 0419) SpO2:  [95 %-98 %] 95 % (07/07 0419) Weight:  [210 lb 11.2 oz (95.573 kg)] 210 lb 11.2 oz (95.573 kg) (07/07 0419)   Physical Exam: General: sitting in bed in NAD Neck: No JVD Cardiovascular: RRR, no murmurs appreciated Respiratory: No increased WOB. CTAB. Abdomen: Soft, NT, distended Extremities: 1+ pitting edema to midshin on R lower extremity. No L LE edema. No calf warmth, erythema, or TTP bilaterally.   Laboratory:  Recent Labs Lab 10/04/15 0032 10/05/15 2136 10/06/15 0353  WBC 11.8* 8.6 13.0*  HGB 15.4 12.1* 16.5  HCT 46.5 36.2* 50.2  PLT 305 267 353    Recent Labs Lab 09/30/15 0508  10/05/15 0231 10/05/15 2136 10/06/15 0353  NA 132*  < > 133* 131* 134*  K 4.1  < > 3.9 3.7 3.9  CL 93*  < > 97* 95* 96*  CO2 29  < > 28 27 28   BUN 14  < > 19 14 15   CREATININE 0.88  < > 0.84 0.82 0.85  CALCIUM 8.4*  < > 8.9 8.8* 8.8*  PROT 5.0*  --   --   --   --   BILITOT 1.2  --   --   --   --   ALKPHOS 108  --   --   --   --   ALT 178*  --   --   --   --   AST 35  --   --   --   --   GLUCOSE 208*  < > 214* 236* 139*  < > = values in this interval not displayed.Troponin: 0.07 > 0.06 BNP 1788  Imaging/Diagnostic Tests:  Right/Left Heart Cath and Coronary Angiography    Conclusion     Ost 2nd Diag to 2nd Diag lesion, 100% stenosed.  Mid LAD lesion, 80%  stenosed.  Prox RCA to Mid RCA lesion, 90% stenosed.  Ost RPDA to RPDA lesion, 75% stenosed.  Prox Cx lesion, 50% stenosed.  Prox Cx to Mid Cx lesion, 80% stenosed.  1. Severe 3 vessel CAD involving high-grade stenoses of the mid-RCA, mid-LAD, and mid-circumflex, and total occlusion of the second diagonal 2. Known severe LV systolic dysfunction  Complex situation in patient with acute DVT, possible lung malignancy, acute heart failure, and pneumonia. He also has newly diagnosed diabetes with HgB A1C > 10 mg/dL. I discussed his case with the primary service, pulmonary service, and placed a consultation with TCTS for consideration of CABG. Plans for FU CT imaging after completed treatment for pneumonia per  pulmonary service. Will resume IV heparin post-cath until we decide on best revascularization strategy. If he is not a candidate for CABG, could do PCI with drug-eluting stents or with Biofreedom stents (throught the [facility] trial requiring 30 days of DAPT per protocol).   Coronary Findings    Dominance: Right   Left Anterior Descending   . Mid LAD lesion, 80% stenosed. The LAD has a long diffuse mid-vessel lesion, estimated at 75-80%   . Second Diagonal Branch   The second diagonal is occluded and fills late from left-to-left collaterals. 2nd Diag filled by collaterals from Lat 3rd Diag.    Ost 2nd Diag to 2nd Diag lesion, 100% stenosed.     Left Circumflex   . Prox Cx lesion, 50% stenosed.   . Prox Cx to Mid Cx lesion, 80% stenosed. Diffuse.     Right Coronary Artery   . Prox RCA to Mid RCA lesion, 90% stenosed. Eccentric.   . Right Posterior Descending Artery   . Ost RPDA to RPDA lesion, 75% stenosed. Diffuse eccentric.          Dg Chest 2 View  10/03/2015  CLINICAL DATA:  I50.9 (ICD-10-CM) - CHF (congestive heart failure) (HCC) J18.9 (ICD-10-CM) - PNA (pneumonia)Cough  cp EXAM: CHEST  2 VIEW COMPARISON:  09/27/2015 FINDINGS: Since the prior exam, worsened  consolidation has developed at the left lung base now obscuring the hemidiaphragm and portions of the left heart border. Consolidation in the left upper lobe is similar to the prior exam. The right lung remains essentially clear. No convincing pleural effusion.  No pneumothorax. Cardiac silhouette is normal in size. No mediastinal or hilar masses or convincing adenopathy. IMPRESSION: 1. Worsened multifocal pneumonia on the left when compared with the prior study. There is increased consolidation in the left lower lobe, likely involving portions of the posterior left upper lobe lingula. Left upper lobe consolidation is without significant change. Electronically Signed   By: Amie Portland M.D.   On: 10/03/2015 08:58   Nm Myocar Multi W/spect W/wall Motion / Ef  10/02/2015   There was no ST segment deviation noted during stress.  T wave inversion was noted during stress in the V2 leads, beginning at 0 minutes of stress. T wave inversion persisted.  Defect 1: There is a large defect of severe severity present in the basal inferior, basal inferolateral, mid inferior, mid inferolateral, apical inferior and apical lateral location.  Findings consistent with prior Inferior / Inferolateral myocardial infarction. There is no evidence of ischemia .  Nuclear stress EF: 22%.  This is a high risk study.  The left ventricular ejection fraction is severely decreased (<30%).    US Abdomen:  1. Mild gallbladder wall thickening and a tiny amount of pericholecystic fluid identified. No stones, sludge, or Murphy's sign. If there is concern for acute cholecystitis, a HIDA scan could better evaluate. 2. Bilateral renal cysts.  ECHO 6/29:  LVEF 30-35%, mildly dilated with global hypokinesis, mild MR, normal biatrial size, moderate TR, RVSP 47 mmHg, dilated IVC, no pericardial effusion.  CTA Chest: 6/28: FINDINGS: Evaluation of this exam is limited due to respiratory motion artifact.  There is an area of  ground-glass on nodular airspace opacity involving the posterior apical segment of the left upper lobe most compatible with pneumonia. Right apical nodular and ground-glass density is also noted to a lesser degree. There is mild diffuse interstitial prominence which may represent mild edema. There is moderate left and small right pleural effusions. No pneumothorax. The  central airways are patent.  There is minimal atherosclerotic calcification of the thoracic aorta.  There is apparent peripheral filling defect in the left pulmonary artery at the bifurcation to the upper and lower lobe (series 5 image 118 - 131). This may represent pulmonary artery embolus or mass effect and compression or invasion by a centrally located left hilar mass. An ill-defined 2.0 x 1.5 cm density in the left hilar region (series 5, image 107) may represent combination of adenopathy and consolidated lung or a centrally located/partially obstructing mass. Follow-up with CT is recommended to ensure resolution of the left lung opacity and exclude an underlying hilar mass. No other central pulmonary artery embolus identified. Evaluation of the distal branches of the pulmonary arteries is limited due to respiratory motion artifact and suboptimal visualization.  There is no cardiomegaly or pericardial effusion. Left hilar adenopathy. The esophagus is grossly unremarkable with there is a small left thyroid hypodense nodule. Ultrasound may provide better evaluation.  There is no axillary adenopathy. There is diffuse subcutaneous soft tissue stranding and edema the chest wall no fluid collection. There is degenerative changes of the spine. No acute fracture.  There is retrograde flow of contrast from the right atrium into the IVC suggestive of a degree of right cardiac dysfunction. A 1.9 cm left renal upper pole hypodense lesion is not well characterized. This is stable compared to the CT dated 11/18/2013 and  likely represents a cyst.  Review of the MIP images confirms the above findings.  IMPRESSION: Left upper lobe pneumonia. An ill-defined centrally located left hilar density may represent combination of adenopathy and consolidated portion of the lung versus a hilar mass. Follow-up is recommended to document resolution of the airspace opacity and exclude underlying hilar mass.  Apparent peripheral filling defect in the left pulmonary artery at the bifurcation of the left upper and left lower lobe branches likely represent extrinsic compression on the vessel by the hilar adenopathy/ mass or invasion of a left hilar lesion into the adjacent pulmonary artery. Acute pulmonary embolus is less likely, given peripheral orientation, but not entirely excluded.  Interstitial edema and bilateral pleural effusions, left greater right.  Bilateral LE Venous Dopplers 6/29: - Findings consistent with acute deep vein thrombosis involving the  right femoral vein, right popliteal vein, right posterior tibial  veins, and right peroneal veins. - No evidence of deep vein or superficial thrombosis involving the  left lower extremity and right common femoral vein. - No evidence of Baker's cyst on the right or left.  Leland HerElsia J Malek Skog, DO 10/06/2015, 6:26 AM PGY-1, [place] [facility] FPTS Intern pager: [phone], text pages welcome

## 2015-10-06 NOTE — Progress Notes (Signed)
ANTICOAGULATION CONSULT NOTE - Follow Up Consult  Pharmacy Consult for Heparin  Indication: DVT, severe 3 vessel disease s/p cath  Allergies  Allergen Reactions  . Bee Venom Anaphylaxis   Patient Measurements: Height: 6' (182.9 cm) Weight: 210 lb 11.2 oz (95.573 kg) (Scale A) IBW/kg (Calculated) : 77.6  Vital Signs: Temp: 98.6 F (37 C) (07/07 0419) Temp Source: Oral (07/07 0419) BP: 117/75 mmHg (07/07 0419) Pulse Rate: 78 (07/07 0419)  Labs:  Recent Labs  10/04/15 0032  10/04/15 [id number] 10/05/15 0231 10/05/15 1204 10/05/15 2136 10/06/15 0351 10/06/15 0353  HGB 15.4  --   --   --   --  12.1*  --  16.5  HCT 46.5  --   --   --   --  36.2*  --  50.2  PLT 305  --   --   --   --  267  --  353  LABPROT 17.2*  --  16.9*  --   --  15.9*  --   --   INR 1.40  --  1.36  --   --  1.25  --   --   HEPARINUNFRC  --   < > 0.29* 0.34 0.75*  --  0.44  --   CREATININE 0.88  --  0.77 0.84  --  0.82  --  0.85  < > = values in this interval not displayed.  Estimated Creatinine Clearance: 113.6 mL/min (by C-G formula based on Cr of 0.85).   Assessment: [age] yoM admitted 09/27/2015 found to have new RLE DVT and severe 3-vessel coronary disease - with planned PCI. Patient has been on heparini for treatment until post procedure and plan to traition to DOAC.  Heparin drip 2600 uts/hr HL 0.44 at goal this am.  CBC has remains stable. No bleeding noted   Goal of Therapy:  Heparin level 0.3-0.7 units/ml Monitor platelets by anticoagulation protocol: Yes   Plan:  -Continue  heparin to 2600 units/hr  -Daily HL and CBC  Leota SauersLisa Amro Winebarger Pharm.D. CPP, BCPS Clinical Pharmacist [phone] 10/06/2015 8:52 AM

## 2015-10-06 NOTE — Interval H&P Note (Signed)
Cath Lab Visit (complete for each Cath Lab visit)  Clinical Evaluation Leading to the Procedure:   ACS: No.  Non-ACS:    Anginal Classification: CCS II  Anti-ischemic medical therapy: Minimal Therapy (1 class of medications)  Non-Invasive Test Results: High-risk stress test findings: cardiac mortality >3%/year  Prior CABG: No previous CABG   Pt with 3 vessel CAD, ischemic cardiomyopathy in setting multiple medical problems with pneumonia and large acute DVT. Cardiac surgical consultation reviewed. Plan for multivessel PCI with Biofreedom DES per [facility] protocol again reviewed with the patient.   History and Physical Interval Note:  10/06/2015 10:31 AM  Jesus GeneraJonathan Tirrell  has presented today for surgery, with the diagnosis of CAD  The various methods of treatment have been discussed with the patient and family. After consideration of risks, benefits and other options for treatment, the patient has consented to  Procedure(s): Coronary Stent Intervention (N/A) as a surgical intervention .  The patient's history has been reviewed, patient examined, no change in status, stable for surgery.  I have reviewed the patient's chart and labs.  Questions were answered to the patient's satisfaction.     Tonny Bollmanooper, Shantell Belongia

## 2015-10-06 NOTE — Progress Notes (Signed)
TRBAND REMOVAL  LOCATION:    right radial  DEFLATED PER PROTOCOL:    Yes.    TIME BAND OFF / DRESSING APPLIED:    1615   SITE UPON ARRIVAL:    Level 0  SITE AFTER BAND REMOVAL:    Level 0  CIRCULATION SENSATION AND MOVEMENT:    Within Normal Limits   Yes.    COMMENTS:   Tolerated procedure well 

## 2015-10-06 NOTE — ED Provider Notes (Signed)
CSN: [id number]     Arrival date & time 09/27/15  1536 History   First MD Initiated Contact with Patient 09/27/15 1609     Chief Complaint  Patient presents with  . Chest Pain     (Consider location/radiation/quality/duration/timing/severity/associated sxs/prior Treatment) Patient is a 60 y.o. male presenting with chest pain and general illness. The history is provided by the patient.  Chest Pain Associated symptoms: cough and fever   Associated symptoms: no abdominal pain, no headache, no palpitations, no shortness of breath and not vomiting   Illness Severity:  Severe Onset quality:  Gradual Duration:  1 week Timing:  Constant Progression:  Worsening Chronicity:  New Associated symptoms: chest pain, congestion, cough and fever   Associated symptoms: no abdominal pain, no diarrhea, no headaches, no myalgias, no rash, no shortness of breath and no vomiting    60 yo M with cough, congestion, fevers, sob.  Being treated for pna but without improvement, back to his clinic today with worsening of symptoms and sent here for evaluation.   Past Medical History  Diagnosis Date  . Diverticulosis     shown on CT   Past Surgical History  Procedure Laterality Date  . Knee surgery    . Cardiac catheterization N/A 10/04/2015    Procedure: Right/Left Heart Cath and Coronary Angiography;  Surgeon: Sherren Mocha, MD;  Location: [facility];  Service: Cardiovascular;  Laterality: N/A;   No family history on file. Social History  Substance Use Topics  . Smoking status: Current Every Day Smoker  . Smokeless tobacco: None  . Alcohol Use: Yes     Comment: daily     Review of Systems  Constitutional: Positive for fever. Negative for chills.  HENT: Positive for congestion. Negative for facial swelling.   Eyes: Negative for discharge and visual disturbance.  Respiratory: Positive for cough. Negative for shortness of breath.   Cardiovascular: Positive for chest pain. Negative for  palpitations.  Gastrointestinal: Negative for vomiting, abdominal pain and diarrhea.  Musculoskeletal: Negative for myalgias and arthralgias.  Skin: Negative for color change and rash.  Neurological: Negative for tremors, syncope and headaches.  Psychiatric/Behavioral: Negative for confusion and dysphoric mood.      Allergies  Bee venom  Home Medications   Prior to Admission medications   Medication Sig Start Date End Date Taking? Authorizing Provider  aspirin EC 81 MG tablet Take 81 mg by mouth daily.   Yes Historical Provider, MD  glipiZIDE (GLUCOTROL) 5 MG tablet Take 1 tablet (5 mg total) by mouth 2 (two) times daily before a meal. 09/19/15  Yes Robyn Haber, MD  ibuprofen (ADVIL,MOTRIN) 200 MG tablet Take 600 mg by mouth every 6 (six) hours as needed for moderate pain. Reported on 09/27/2015   Yes Historical Provider, MD  OVER THE COUNTER MEDICATION Emergen-C packets: Mix and drink one packet by mouth daily   Yes Historical Provider, MD   BP 117/75 mmHg  Pulse 78  Temp(Src) 98.6 F (37 C) (Oral)  Resp 18  Ht 6' (1.829 m)  Wt 210 lb 11.2 oz (95.573 kg)  BMI 28.57 kg/m2  SpO2 95% Physical Exam  Constitutional: He is oriented to person, place, and time. He appears well-developed and well-nourished.  HENT:  Head: Normocephalic and atraumatic.  Eyes: EOM are normal. Pupils are equal, round, and reactive to light.  Neck: Normal range of motion. Neck supple. No JVD present.  Cardiovascular: Normal rate and regular rhythm.  Exam reveals no gallop and no friction  rub.   No murmur heard. Pulmonary/Chest: No respiratory distress. He has no wheezes. He has rales (bilatearlly).  Abdominal: He exhibits no distension. There is no tenderness. There is no rebound and no guarding.  Musculoskeletal: Normal range of motion. He exhibits edema (+3 to the knees). He exhibits no tenderness.  Neurological: He is alert and oriented to person, place, and time.  Skin: No rash noted. No pallor.   Psychiatric: He has a normal mood and affect. His behavior is normal.  Nursing note and vitals reviewed.   ED Course  Procedures (including critical care time) Labs Review Labs Reviewed  BASIC METABOLIC PANEL - Abnormal; Notable for the following:    Sodium 128 (*)    Chloride 93 (*)    Glucose, Bld 256 (*)    Calcium 8.6 (*)    All other components within normal limits  CBC - Abnormal; Notable for the following:    WBC 19.8 (*)    All other components within normal limits  HEPATIC FUNCTION PANEL - Abnormal; Notable for the following:    Total Protein 6.3 (*)    Albumin 2.9 (*)    AST 135 (*)    ALT 525 (*)    Alkaline Phosphatase 193 (*)    Total Bilirubin 1.9 (*)    Bilirubin, Direct 0.6 (*)    Indirect Bilirubin 1.3 (*)    All other components within normal limits  BRAIN NATRIURETIC PEPTIDE - Abnormal; Notable for the following:    B Natriuretic Peptide 1788.2 (*)    All other components within normal limits  URINALYSIS, ROUTINE W REFLEX MICROSCOPIC (NOT AT [facility]) - Abnormal; Notable for the following:    APPearance CLOUDY (*)    Specific Gravity, Urine 1.036 (*)    Glucose, UA >1000 (*)    Hgb urine dipstick SMALL (*)    Ketones, ur 15 (*)    Protein, ur 100 (*)    All other components within normal limits  CBC - Abnormal; Notable for the following:    WBC 20.0 (*)    All other components within normal limits  COMPREHENSIVE METABOLIC PANEL - Abnormal; Notable for the following:    Sodium 128 (*)    Chloride 98 (*)    Glucose, Bld 174 (*)    Calcium 8.2 (*)    Total Protein 5.4 (*)    Albumin 2.6 (*)    AST 104 (*)    ALT 424 (*)    Alkaline Phosphatase 149 (*)    Total Bilirubin 1.5 (*)    All other components within normal limits  CBC - Abnormal; Notable for the following:    WBC 19.7 (*)    All other components within normal limits  TROPONIN I - Abnormal; Notable for the following:    Troponin I 0.07 (*)    All other components within normal limits   TROPONIN I - Abnormal; Notable for the following:    Troponin I 0.06 (*)    All other components within normal limits  TROPONIN I - Abnormal; Notable for the following:    Troponin I 0.10 (*)    All other components within normal limits  HEMOGLOBIN A1C - Abnormal; Notable for the following:    Hgb A1c MFr Bld 10.4 (*)    All other components within normal limits  PROTIME-INR - Abnormal; Notable for the following:    Prothrombin Time 22.0 (*)    INR 1.93 (*)    All other components within normal limits  GLUCOSE, CAPILLARY - Abnormal; Notable for the following:    Glucose-Capillary 304 (*)    All other components within normal limits  URINE MICROSCOPIC-ADD ON - Abnormal; Notable for the following:    Squamous Epithelial / LPF 0-5 (*)    Bacteria, UA RARE (*)    Casts HYALINE CASTS (*)    All other components within normal limits  GLUCOSE, CAPILLARY - Abnormal; Notable for the following:    Glucose-Capillary 196 (*)    All other components within normal limits  D-DIMER, QUANTITATIVE (NOT AT [facility]) - Abnormal; Notable for the following:    D-Dimer, Quant 5.73 (*)    All other components within normal limits  TROPONIN I - Abnormal; Notable for the following:    Troponin I 0.07 (*)    All other components within normal limits  TROPONIN I - Abnormal; Notable for the following:    Troponin I 0.12 (*)    All other components within normal limits  HEPARIN LEVEL (UNFRACTIONATED) - Abnormal; Notable for the following:    Heparin Unfractionated <0.10 (*)    All other components within normal limits  GLUCOSE, CAPILLARY - Abnormal; Notable for the following:    Glucose-Capillary 275 (*)    All other components within normal limits  TROPONIN I - Abnormal; Notable for the following:    Troponin I 0.08 (*)    All other components within normal limits  HEPARIN LEVEL (UNFRACTIONATED) - Abnormal; Notable for the following:    Heparin Unfractionated 0.12 (*)    All other components within  normal limits  GLUCOSE, CAPILLARY - Abnormal; Notable for the following:    Glucose-Capillary 183 (*)    All other components within normal limits  CBC - Abnormal; Notable for the following:    WBC 18.3 (*)    All other components within normal limits  COMPREHENSIVE METABOLIC PANEL - Abnormal; Notable for the following:    Sodium 132 (*)    Chloride 94 (*)    Glucose, Bld 160 (*)    Calcium 8.4 (*)    Total Protein 5.3 (*)    Albumin 2.3 (*)    AST 61 (*)    ALT 284 (*)    Total Bilirubin 1.9 (*)    All other components within normal limits  GLUCOSE, CAPILLARY - Abnormal; Notable for the following:    Glucose-Capillary 173 (*)    All other components within normal limits  HEPARIN LEVEL (UNFRACTIONATED) - Abnormal; Notable for the following:    Heparin Unfractionated 0.24 (*)    All other components within normal limits  GLUCOSE, CAPILLARY - Abnormal; Notable for the following:    Glucose-Capillary 153 (*)    All other components within normal limits  GLUCOSE, CAPILLARY - Abnormal; Notable for the following:    Glucose-Capillary 249 (*)    All other components within normal limits  GLUCOSE, CAPILLARY - Abnormal; Notable for the following:    Glucose-Capillary 173 (*)    All other components within normal limits  CBC - Abnormal; Notable for the following:    WBC 15.4 (*)    All other components within normal limits  COMPREHENSIVE METABOLIC PANEL - Abnormal; Notable for the following:    Sodium 132 (*)    Chloride 93 (*)    Glucose, Bld 208 (*)    Calcium 8.4 (*)    Total Protein 5.0 (*)    Albumin 2.0 (*)    ALT 178 (*)    All other components within normal limits  BRAIN NATRIURETIC PEPTIDE - Abnormal; Notable for the following:    B Natriuretic Peptide 1061.5 (*)    All other components within normal limits  GLUCOSE, CAPILLARY - Abnormal; Notable for the following:    Glucose-Capillary 250 (*)    All other components within normal limits  GLUCOSE, CAPILLARY -  Abnormal; Notable for the following:    Glucose-Capillary 231 (*)    All other components within normal limits  GLUCOSE, CAPILLARY - Abnormal; Notable for the following:    Glucose-Capillary 274 (*)    All other components within normal limits  GLUCOSE, CAPILLARY - Abnormal; Notable for the following:    Glucose-Capillary 241 (*)    All other components within normal limits  CBC - Abnormal; Notable for the following:    WBC 13.1 (*)    All other components within normal limits  BASIC METABOLIC PANEL - Abnormal; Notable for the following:    Potassium 3.2 (*)    Chloride 97 (*)    Glucose, Bld 175 (*)    Calcium 8.3 (*)    All other components within normal limits  GLUCOSE, CAPILLARY - Abnormal; Notable for the following:    Glucose-Capillary 201 (*)    All other components within normal limits  GLUCOSE, CAPILLARY - Abnormal; Notable for the following:    Glucose-Capillary 240 (*)    All other components within normal limits  GLUCOSE, CAPILLARY - Abnormal; Notable for the following:    Glucose-Capillary 334 (*)    All other components within normal limits  GLUCOSE, CAPILLARY - Abnormal; Notable for the following:    Glucose-Capillary 151 (*)    All other components within normal limits  BASIC METABOLIC PANEL - Abnormal; Notable for the following:    Sodium 134 (*)    Chloride 95 (*)    Glucose, Bld 135 (*)    Calcium 8.8 (*)    All other components within normal limits  GLUCOSE, CAPILLARY - Abnormal; Notable for the following:    Glucose-Capillary 208 (*)    All other components within normal limits  GLUCOSE, CAPILLARY - Abnormal; Notable for the following:    Glucose-Capillary 137 (*)    All other components within normal limits  PROTIME-INR - Abnormal; Notable for the following:    Prothrombin Time 17.9 (*)    All other components within normal limits  GLUCOSE, CAPILLARY - Abnormal; Notable for the following:    Glucose-Capillary 269 (*)    All other components within  normal limits  GLUCOSE, CAPILLARY - Abnormal; Notable for the following:    Glucose-Capillary 265 (*)    All other components within normal limits  BASIC METABOLIC PANEL - Abnormal; Notable for the following:    Sodium 133 (*)    Chloride 95 (*)    Glucose, Bld 179 (*)    Calcium 8.4 (*)    All other components within normal limits  GLUCOSE, CAPILLARY - Abnormal; Notable for the following:    Glucose-Capillary 187 (*)    All other components within normal limits  PROTIME-INR - Abnormal; Notable for the following:    Prothrombin Time 18.0 (*)    All other components within normal limits  GLUCOSE, CAPILLARY - Abnormal; Notable for the following:    Glucose-Capillary 177 (*)    All other components within normal limits  GLUCOSE, CAPILLARY - Abnormal; Notable for the following:    Glucose-Capillary 287 (*)    All other components within normal limits  GLUCOSE, CAPILLARY - Abnormal; Notable for the following:  Glucose-Capillary 234 (*)    All other components within normal limits  HEPARIN LEVEL (UNFRACTIONATED) - Abnormal; Notable for the following:    Heparin Unfractionated 0.29 (*)    All other components within normal limits  PROTIME-INR - Abnormal; Notable for the following:    Prothrombin Time 16.9 (*)    All other components within normal limits  BASIC METABOLIC PANEL - Abnormal; Notable for the following:    Sodium 132 (*)    Chloride 95 (*)    Glucose, Bld 268 (*)    Calcium 8.6 (*)    All other components within normal limits  GLUCOSE, CAPILLARY - Abnormal; Notable for the following:    Glucose-Capillary 225 (*)    All other components within normal limits  BASIC METABOLIC PANEL - Abnormal; Notable for the following:    Sodium 132 (*)    Chloride 95 (*)    Glucose, Bld 283 (*)    Calcium 8.4 (*)    All other components within normal limits  PROTIME-INR - Abnormal; Notable for the following:    Prothrombin Time 17.2 (*)    All other components within normal limits   CBC - Abnormal; Notable for the following:    WBC 11.8 (*)    All other components within normal limits  GLUCOSE, CAPILLARY - Abnormal; Notable for the following:    Glucose-Capillary 212 (*)    All other components within normal limits  GLUCOSE, CAPILLARY - Abnormal; Notable for the following:    Glucose-Capillary 290 (*)    All other components within normal limits  BASIC METABOLIC PANEL - Abnormal; Notable for the following:    Sodium 133 (*)    Chloride 97 (*)    Glucose, Bld 214 (*)    All other components within normal limits  GLUCOSE, CAPILLARY - Abnormal; Notable for the following:    Glucose-Capillary 215 (*)    All other components within normal limits  HEPARIN LEVEL (UNFRACTIONATED) - Abnormal; Notable for the following:    Heparin Unfractionated 0.75 (*)    All other components within normal limits  GLUCOSE, CAPILLARY - Abnormal; Notable for the following:    Glucose-Capillary 227 (*)    All other components within normal limits  GLUCOSE, CAPILLARY - Abnormal; Notable for the following:    Glucose-Capillary 227 (*)    All other components within normal limits  GLUCOSE, CAPILLARY - Abnormal; Notable for the following:    Glucose-Capillary 206 (*)    All other components within normal limits  CBC - Abnormal; Notable for the following:    WBC 13.0 (*)    All other components within normal limits  BASIC METABOLIC PANEL - Abnormal; Notable for the following:    Sodium 134 (*)    Chloride 96 (*)    Glucose, Bld 139 (*)    Calcium 8.8 (*)    All other components within normal limits  BASIC METABOLIC PANEL - Abnormal; Notable for the following:    Sodium 131 (*)    Chloride 95 (*)    Glucose, Bld 236 (*)    Calcium 8.8 (*)    All other components within normal limits  PROTIME-INR - Abnormal; Notable for the following:    Prothrombin Time 15.9 (*)    All other components within normal limits  CBC - Abnormal; Notable for the following:    RBC 3.82 (*)     Hemoglobin 12.1 (*)    HCT 36.2 (*)    All other components within normal  limits  GLUCOSE, CAPILLARY - Abnormal; Notable for the following:    Glucose-Capillary 269 (*)    All other components within normal limits  GLUCOSE, CAPILLARY - Abnormal; Notable for the following:    Glucose-Capillary 172 (*)    All other components within normal limits  GLUCOSE, CAPILLARY - Abnormal; Notable for the following:    Glucose-Capillary 167 (*)    All other components within normal limits  I-STAT CHEM 8, ED - Abnormal; Notable for the following:    Sodium 128 (*)    Chloride 93 (*)    Creatinine, Ser 0.50 (*)    Glucose, Bld 246 (*)    Calcium, Ion 1.06 (*)    Hemoglobin 18.4 (*)    HCT 54.0 (*)    All other components within normal limits  POCT I-STAT 3, VENOUS BLOOD GAS (G3P V) - Abnormal; Notable for the following:    pH, Ven 7.388 (*)    pCO2, Ven 51.4 (*)    Bicarbonate 31.0 (*)    Acid-Base Excess 4.0 (*)    All other components within normal limits  POCT I-STAT 3, VENOUS BLOOD GAS (G3P V) - Abnormal; Notable for the following:    pH, Ven 7.381 (*)    pCO2, Ven 52.0 (*)    Bicarbonate 30.8 (*)    Acid-Base Excess 4.0 (*)    All other components within normal limits  POCT I-STAT 3, ART BLOOD GAS (G3+) - Abnormal; Notable for the following:    pH, Arterial 7.303 (*)    pCO2 arterial 48.0 (*)    pO2, Arterial 66.0 (*)    Acid-base deficit 3.0 (*)    All other components within normal limits  CULTURE, BLOOD (ROUTINE X 2)  CULTURE, BLOOD (ROUTINE X 2)  CREATININE, SERUM  STREP PNEUMONIAE URINARY ANTIGEN  LEGIONELLA PNEUMOPHILA SEROGP 1 UR AG  HEPATITIS PANEL, ACUTE  AMMONIA  TSH  STREP PNEUMONIAE URINARY ANTIGEN  LEGIONELLA PNEUMOPHILA SEROGP 1 UR AG  PROCALCITONIN  HEPARIN LEVEL (UNFRACTIONATED)  PROCALCITONIN  HEPARIN LEVEL (UNFRACTIONATED)  HEPARIN LEVEL (UNFRACTIONATED)  PROCALCITONIN  HEPARIN LEVEL (UNFRACTIONATED)  HEPARIN LEVEL (UNFRACTIONATED)  FERRITIN  HEPARIN  LEVEL (UNFRACTIONATED)  HEPARIN LEVEL (UNFRACTIONATED)  I-STAT TROPOININ, ED    Imaging Review No results found. I have personally reviewed and evaluated these images and lab results as part of my medical decision-making.   EKG Interpretation   Date/Time:  Wednesday September 27 2015 15:44:19 EDT Ventricular Rate:  113 PR Interval:  148 QRS Duration: 94 QT Interval:  342 QTC Calculation: 469 R Axis:   69 Text Interpretation:  Sinus tachycardia Possible Left atrial enlargement  Low voltage QRS Borderline ECG Since last tracing rate faster Confirmed by  Aurther Harlin MD, Quillian Quince ([id number]) on 09/27/2015 5:01:05 PM      MDM   Final diagnoses:  Dyspnea  Swelling of both lower extremities  Transaminitis  Cardiomyopathy (HCC)  CHF (congestive heart failure) (HCC)  PNA (pneumonia)    60 yo M with persistent pna.  Patient tachycardic and ill appearing, start on broad specturm abx.  Admit.   The patients results and plan were reviewed and discussed.   Any x-rays performed were independently reviewed by myself.   Differential diagnosis were considered with the presenting HPI.  Medications  aspirin EC tablet 81 mg (81 mg Oral Given 10/05/15 1127)  sodium chloride flush (NS) 0.9 % injection 3 mL (0 mLs Intravenous Duplicate 7/0/48 8891)  polyethylene glycol (MIRALAX / GLYCOLAX) packet 17 g ( Oral MAR Unhold  10/04/15 1220)  ondansetron (ZOFRAN) tablet 4 mg ( Oral MAR Unhold 10/04/15 1220)    Or  ondansetron (ZOFRAN) injection 4 mg ( Intravenous MAR Unhold 10/04/15 1220)  insulin aspart (novoLOG) injection 0-15 Units ( Subcutaneous Not Given 10/06/15 0641)  insulin aspart (novoLOG) injection 0-5 Units (3 Units Subcutaneous Given 10/05/15 2131)  hydrALAZINE (APRESOLINE) injection 5 mg ( Intravenous MAR Unhold 10/04/15 1220)  benzonatate (TESSALON) capsule 200 mg (200 mg Oral Given 10/04/15 1743)  LORazepam (ATIVAN) tablet 1 mg (not administered)    Or  LORazepam (ATIVAN) injection 1 mg (not administered)   thiamine (VITAMIN B-1) tablet 100 mg (100 mg Oral Given 05/10/39 3244)  folic acid (FOLVITE) tablet 1 mg (1 mg Oral Given 10/05/15 1126)  multivitamin with minerals tablet 1 tablet (1 tablet Oral Given 10/05/15 1123)  perflutren lipid microspheres (DEFINITY) IV suspension (2 mLs Intravenous Given 09/28/15 1346)  furosemide (LASIX) injection 40 mg (40 mg Intravenous Given 10/05/15 2116)  spironolactone (ALDACTONE) tablet 12.5 mg (12.5 mg Oral Given 10/05/15 1125)  lisinopril (PRINIVIL,ZESTRIL) tablet 5 mg (5 mg Oral Given 10/05/15 1124)  insulin glargine (LANTUS) injection 7 Units (7 Units Subcutaneous Not Given 10/06/15 0800)  potassium chloride SA (K-DUR,KLOR-CON) CR tablet 20 mEq (20 mEq Oral Given 10/06/15 0630)  carvedilol (COREG) tablet 6.25 mg (6.25 mg Oral Given 10/06/15 0630)  acetaminophen (TYLENOL) tablet 650 mg (not administered)  sodium chloride flush (NS) 0.9 % injection 3 mL (3 mLs Intravenous Given 10/05/15 2116)  sodium chloride flush (NS) 0.9 % injection 3 mL (not administered)  0.9 %  sodium chloride infusion (not administered)  heparin ADULT infusion 100 units/mL (25000 units/256m sodium chloride 0.45%) (2,600 Units/hr Intravenous New Bag/Given 10/06/15 0403)  diphenhydrAMINE (BENADRYL) capsule 25 mg (25 mg Oral Given 10/04/15 1743)  sodium chloride flush (NS) 0.9 % injection 3 mL (3 mLs Intravenous Not Given 10/05/15 2200)  sodium chloride flush (NS) 0.9 % injection 3 mL (not administered)  0.9 %  sodium chloride infusion (not administered)  0.9% sodium chloride infusion (3 mL/kg/hr  97.2 kg Intravenous New Bag/Given 10/06/15 0600)    Followed by  0.9% sodium chloride infusion (1 mL/kg/hr  97.2 kg Intravenous Rate/Dose Change 10/06/15 0816)  vancomycin (VANCOCIN) 2,000 mg in sodium chloride 0.9 % 500 mL IVPB (2,000 mg Intravenous New Bag/Given 09/27/15 1801)  piperacillin-tazobactam (ZOSYN) IVPB 3.375 g (3.375 g Intravenous New Bag/Given 09/27/15 1803)  iopamidol (ISOVUE-370) 76 % injection (100 mLs   Contrast Given 09/27/15 2322)  heparin bolus via infusion 4,000 Units (4,000 Units Intravenous Given 09/28/15 1034)  living well with diabetes book MISC ( Does not apply Given 09/28/15 1445)  heparin bolus via infusion 2,000 Units (2,000 Units Intravenous Given 09/28/15 1704)  heparin bolus via infusion 3,000 Units (3,000 Units Intravenous Given 09/29/15 0030)  levalbuterol (XOPENEX) nebulizer solution 0.63 mg (0.63 mg Nebulization Given 09/29/15 0920)  heparin bolus via infusion 1,500 Units (1,500 Units Intravenous Given 09/29/15 1031)  levofloxacin (LEVAQUIN) tablet 750 mg (750 mg Oral Given 10/01/15 1700)  living well with diabetes book MISC ( Does not apply Given 09/29/15 1215)  insulin starter kit- pen needles (English) 1 kit (1 kit Other Given 09/29/15 1452)  potassium chloride (K-DUR,KLOR-CON) CR tablet 30 mEq (30 mEq Oral Given 10/01/15 1605)  diphenhydrAMINE (BENADRYL) capsule 25 mg (25 mg Oral Given 10/01/15 2124)  technetium tetrofosmin (TC-MYOVIEW) injection 10 milli Curie (10 milli Curies Intravenous Contrast Given 10/02/15 0925)  regadenoson (LEXISCAN) injection SOLN 0.4 mg (0.4 mg Intravenous Given 10/02/15 1024)  regadenoson (LEXISCAN) 0.4 MG/5ML injection SOLN (  Given by Other 10/02/15 1000)  technetium tetrofosmin (TC-MYOVIEW) injection 30 milli Curie (30 milli Curies Intravenous Contrast Given 10/02/15 1025)  aspirin chewable tablet 81 mg (81 mg Oral Given 10/04/15 0616)  traZODone (DESYREL) tablet 50 mg (50 mg Oral Given 10/02/15 2245)  aspirin chewable tablet 81 mg (81 mg Oral Given 10/06/15 0630)    Filed Vitals:   10/05/15 1122 10/05/15 2011 10/05/15 2359 10/06/15 0419  BP: 127/80 124/82 103/55 117/75  Pulse: 83 82 79 78  Temp:  98.2 F (36.8 C) 98.8 F (37.1 C) 98.6 F (37 C)  TempSrc:  Oral Oral Oral  Resp: 18 18 18 18   Height:      Weight:    210 lb 11.2 oz (95.573 kg)  SpO2: 98% 96% 96% 95%    Final diagnoses:  Dyspnea  Swelling of both lower extremities  Transaminitis   Cardiomyopathy (HCC)  CHF (congestive heart failure) (HCC)  PNA (pneumonia)        Deno Etienne, DO 10/06/15 0601

## 2015-10-06 NOTE — H&P (View-Only) (Signed)
Subjective:  No CP. Continues to be SOB  Objective:  Temp:  [97.5 F (36.4 C)-98.9 F (37.2 C)] 98.2 F (36.8 C) (07/06 0500) Pulse Rate:  [0-187] 95 (07/06 0500) Resp:  [0-31] 18 (07/06 0500) BP: (112-147)/(70-106) 142/82 mmHg (07/06 0500) SpO2:  [0 %-99 %] 97 % (07/06 0500) Weight:  [214 lb 4.8 oz (97.206 kg)] 214 lb 4.8 oz (97.206 kg) (07/06 0500) Weight change: -4 lb 4.8 oz (-1.95 kg)  Intake/Output from previous day: 07/05 0701 - 07/06 0700 In: 1344.5 [P.O.:610; I.V.:734.5] Out: 4525 [Urine:4525]  Intake/Output from this shift: Total I/O In: 240 [P.O.:240] Out: 200 [Urine:200]  Physical Exam: General appearance: alert and no distress Neck: no adenopathy, no carotid bruit, no JVD, supple, symmetrical, trachea midline and thyroid not enlarged, symmetric, no tenderness/mass/nodules Lungs: Scattered crackles right base Heart: regular rate and rhythm, S1, S2 normal, no murmur, click, rub or gallop Extremities: extremities normal, atraumatic, no cyanosis or edema  Lab Results: Results for orders placed or performed during the hospital encounter of 09/27/15 (from the past 48 hour(s))  Glucose, capillary     Status: Abnormal   Collection Time: 10/03/15 12:04 PM  Result Value Ref Range   Glucose-Capillary 287 (H) 65 - 99 mg/dL   Comment 1 Notify RN   Glucose, capillary     Status: Abnormal   Collection Time: 10/03/15  4:37 PM  Result Value Ref Range   Glucose-Capillary 234 (H) 65 - 99 mg/dL   Comment 1 Notify RN   Glucose, capillary     Status: Abnormal   Collection Time: 10/03/15  9:29 PM  Result Value Ref Range   Glucose-Capillary 225 (H) 65 - 99 mg/dL  Basic metabolic panel     Status: Abnormal   Collection Time: 10/04/15 12:32 AM  Result Value Ref Range   Sodium 132 (L) 135 - 145 mmol/L   Potassium 3.6 3.5 - 5.1 mmol/L   Chloride 95 (L) 101 - 111 mmol/L   CO2 29 22 - 32 mmol/L   Glucose, Bld 283 (H) 65 - 99 mg/dL   BUN 16 6 - 20 mg/dL   Creatinine,  Ser 0.88 0.61 - 1.24 mg/dL   Calcium 8.4 (L) 8.9 - 10.3 mg/dL   GFR calc non Af Amer >60 >60 mL/min   GFR calc Af Amer >60 >60 mL/min    Comment: (NOTE) The eGFR has been calculated using the CKD EPI equation. This calculation has not been validated in all clinical situations. eGFR's persistently <60 mL/min signify possible Chronic Kidney Disease.    Anion gap 8 5 - 15  Protime-INR     Status: Abnormal   Collection Time: 10/04/15 12:32 AM  Result Value Ref Range   Prothrombin Time 17.2 (H) 11.6 - 15.2 seconds   INR 1.40 0.00 - 1.49  CBC     Status: Abnormal   Collection Time: 10/04/15 12:32 AM  Result Value Ref Range   WBC 11.8 (H) 4.0 - 10.5 K/uL   RBC 4.93 4.22 - 5.81 MIL/uL   Hemoglobin 15.4 13.0 - 17.0 g/dL   HCT 46.5 39.0 - 52.0 %   MCV 94.3 78.0 - 100.0 fL   MCH 31.2 26.0 - 34.0 pg   MCHC 33.1 30.0 - 36.0 g/dL   RDW 14.3 11.5 - 15.5 %   Platelets 305 150 - 400 K/uL  Heparin level (unfractionated)     Status: Abnormal   Collection Time: 10/04/15  3:39 AM  Result Value Ref Range  Heparin Unfractionated 0.29 (L) 0.30 - 0.70 IU/mL    Comment:        IF HEPARIN RESULTS ARE BELOW EXPECTED VALUES, AND PATIENT DOSAGE HAS BEEN CONFIRMED, SUGGEST FOLLOW UP TESTING OF ANTITHROMBIN III LEVELS.   Protime-INR     Status: Abnormal   Collection Time: 10/04/15  3:39 AM  Result Value Ref Range   Prothrombin Time 16.9 (H) 11.6 - 15.2 seconds   INR 1.36 0.00 - 1.28  Basic metabolic panel     Status: Abnormal   Collection Time: 10/04/15  3:39 AM  Result Value Ref Range   Sodium 132 (L) 135 - 145 mmol/L   Potassium 3.8 3.5 - 5.1 mmol/L   Chloride 95 (L) 101 - 111 mmol/L   CO2 28 22 - 32 mmol/L   Glucose, Bld 268 (H) 65 - 99 mg/dL   BUN 16 6 - 20 mg/dL   Creatinine, Ser 0.77 0.61 - 1.24 mg/dL   Calcium 8.6 (L) 8.9 - 10.3 mg/dL   GFR calc non Af Amer >60 >60 mL/min   GFR calc Af Amer >60 >60 mL/min    Comment: (NOTE) The eGFR has been calculated using the CKD EPI  equation. This calculation has not been validated in all clinical situations. eGFR's persistently <60 mL/min signify possible Chronic Kidney Disease.    Anion gap 9 5 - 15  Glucose, capillary     Status: Abnormal   Collection Time: 10/04/15  6:53 AM  Result Value Ref Range   Glucose-Capillary 212 (H) 65 - 99 mg/dL  I-STAT 3, venous blood gas (G3P V)     Status: Abnormal   Collection Time: 10/04/15 11:35 AM  Result Value Ref Range   pH, Ven 7.381 (H) 7.250 - 7.300   pCO2, Ven 52.0 (H) 45.0 - 50.0 mmHg   pO2, Ven 33.0 31.0 - 45.0 mmHg   Bicarbonate 30.8 (H) 20.0 - 24.0 mEq/L   TCO2 32 0 - 100 mmol/L   O2 Saturation 61.0 %   Acid-Base Excess 4.0 (H) 0.0 - 2.0 mmol/L   Patient temperature HIDE    Sample type VENOUS    Comment NOTIFIED PHYSICIAN   I-STAT 3, venous blood gas (G3P V)     Status: Abnormal   Collection Time: 10/04/15 11:37 AM  Result Value Ref Range   pH, Ven 7.388 (H) 7.250 - 7.300   pCO2, Ven 51.4 (H) 45.0 - 50.0 mmHg   pO2, Ven 34.0 31.0 - 45.0 mmHg   Bicarbonate 31.0 (H) 20.0 - 24.0 mEq/L   TCO2 33 0 - 100 mmol/L   O2 Saturation 63.0 %   Acid-Base Excess 4.0 (H) 0.0 - 2.0 mmol/L   Patient temperature HIDE    Sample type VENOUS    Comment NOTIFIED PHYSICIAN   I-STAT 3, arterial blood gas (G3+)     Status: Abnormal   Collection Time: 10/04/15 11:45 AM  Result Value Ref Range   pH, Arterial 7.303 (L) 7.350 - 7.450   pCO2 arterial 48.0 (H) 35.0 - 45.0 mmHg   pO2, Arterial 66.0 (L) 80.0 - 100.0 mmHg   Bicarbonate 23.8 20.0 - 24.0 mEq/L   TCO2 25 0 - 100 mmol/L   O2 Saturation 90.0 %   Acid-base deficit 3.0 (H) 0.0 - 2.0 mmol/L   Patient temperature HIDE    Sample type ARTERIAL   Glucose, capillary     Status: Abnormal   Collection Time: 10/04/15  4:56 PM  Result Value Ref Range   Glucose-Capillary 290 (  H) 65 - 99 mg/dL   Comment 1 Notify RN   Glucose, capillary     Status: Abnormal   Collection Time: 10/04/15  9:30 PM  Result Value Ref Range    Glucose-Capillary 215 (H) 65 - 99 mg/dL   Comment 1 Notify RN    Comment 2 Document in Chart   Heparin level (unfractionated)     Status: None   Collection Time: 10/05/15  2:31 AM  Result Value Ref Range   Heparin Unfractionated 0.34 0.30 - 0.70 IU/mL    Comment:        IF HEPARIN RESULTS ARE BELOW EXPECTED VALUES, AND PATIENT DOSAGE HAS BEEN CONFIRMED, SUGGEST FOLLOW UP TESTING OF ANTITHROMBIN III LEVELS.   Basic metabolic panel     Status: Abnormal   Collection Time: 10/05/15  2:31 AM  Result Value Ref Range   Sodium 133 (L) 135 - 145 mmol/L   Potassium 3.9 3.5 - 5.1 mmol/L   Chloride 97 (L) 101 - 111 mmol/L   CO2 28 22 - 32 mmol/L   Glucose, Bld 214 (H) 65 - 99 mg/dL   BUN 19 6 - 20 mg/dL   Creatinine, Ser 0.84 0.61 - 1.24 mg/dL   Calcium 8.9 8.9 - 10.3 mg/dL   GFR calc non Af Amer >60 >60 mL/min   GFR calc Af Amer >60 >60 mL/min    Comment: (NOTE) The eGFR has been calculated using the CKD EPI equation. This calculation has not been validated in all clinical situations. eGFR's persistently <60 mL/min signify possible Chronic Kidney Disease.    Anion gap 8 5 - 15  Glucose, capillary     Status: Abnormal   Collection Time: 10/05/15  5:37 AM  Result Value Ref Range   Glucose-Capillary 227 (H) 65 - 99 mg/dL   Comment 1 Notify RN    Comment 2 Document in Chart     Imaging: Imaging results have been reviewed  Assessment/Plan:   1. Principal Problem: 2.   Acute respiratory failure with hypoxia (HCC) 3. Active Problems: 4.   Uncontrolled type 2 DM- on oral agent  5.   Acute systolic CHF  6.   Anasarca 7.   Elevated liver enzymes 8.   CAP (community acquired pneumonia)-LUL 9.   Cardiomyopathy- etiology not yet determined- appears to be NICM 10.   ETOH abuse-daily drinker 11.   Smoker-quit "9 days ago" 12.   DVT-Rt lower extremity ([place]) 13.   Pulmonary hypertension ([place]) 14.   Abnormal CT scan of lung 15.   Abnormal nuclear stress test 16.   CHF (congestive  heart failure) ([place]) 17.   PNA (pneumonia) 18.   Time Spent Directly with Patient:  20 minutes  Length of Stay:  LOS: 8 days   Results of cath noted and films reviewed with DR. Cooper. I agree that given pts age and focality of lesions can probably be treated with DES (Biofreedom stent with 30 days of DAPT). He will need to be on a DOAC post cath and will need OP Pulm F/U of the lung mass.   Zian, Delair 10/05/2015, 9:39 AM

## 2015-10-07 DIAGNOSIS — Z955 Presence of coronary angioplasty implant and graft: Secondary | ICD-10-CM | POA: Insufficient documentation

## 2015-10-07 DIAGNOSIS — F1010 Alcohol abuse, uncomplicated: Secondary | ICD-10-CM

## 2015-10-07 DIAGNOSIS — I2510 Atherosclerotic heart disease of native coronary artery without angina pectoris: Secondary | ICD-10-CM

## 2015-10-07 DIAGNOSIS — I25119 Atherosclerotic heart disease of native coronary artery with unspecified angina pectoris: Secondary | ICD-10-CM

## 2015-10-07 LAB — BASIC METABOLIC PANEL
Anion gap: 8 (ref 5–15)
BUN: 13 mg/dL (ref 6–20)
CALCIUM: 9.2 mg/dL (ref 8.9–10.3)
CO2: 25 mmol/L (ref 22–32)
CREATININE: 0.79 mg/dL (ref 0.61–1.24)
Chloride: 100 mmol/L — ABNORMAL LOW (ref 101–111)
GFR calc Af Amer: >60 mL/min (ref >60)
GLUCOSE: 199 mg/dL — AB (ref 65–99)
Potassium: 4.2 mmol/L (ref 3.5–5.1)
Sodium: 133 mmol/L — ABNORMAL LOW (ref 135–145)

## 2015-10-07 LAB — MAGNESIUM: MAGNESIUM: 1.8 mg/dL (ref 1.7–2.4)

## 2015-10-07 LAB — HEPATIC FUNCTION PANEL
ALT: 44 U/L (ref 17–63)
AST: 22 U/L (ref 15–41)
Albumin: 2.7 g/dL — ABNORMAL LOW (ref 3.5–5.0)
Alkaline Phosphatase: 88 U/L (ref 38–126)
BILIRUBIN DIRECT: 0.3 mg/dL (ref 0.1–0.5)
BILIRUBIN INDIRECT: 0.9 mg/dL (ref 0.3–0.9)
BILIRUBIN TOTAL: 1.2 mg/dL (ref 0.3–1.2)
Total Protein: 6.5 g/dL (ref 6.5–8.1)

## 2015-10-07 LAB — CBC
HEMATOCRIT: 49.8 % (ref 39.0–52.0)
Hemoglobin: 16.5 g/dL (ref 13.0–17.0)
MCH: 31.3 pg (ref 26.0–34.0)
MCHC: 33.1 g/dL (ref 30.0–36.0)
MCV: 94.5 fL (ref 78.0–100.0)
PLATELETS: 357 10*3/uL (ref 150–400)
RBC: 5.27 MIL/uL (ref 4.22–5.81)
RDW: 14.1 % (ref 11.5–15.5)
WBC: 14 10*3/uL — ABNORMAL HIGH (ref 4.0–10.5)

## 2015-10-07 LAB — GLUCOSE, CAPILLARY: GLUCOSE-CAPILLARY: 205 mg/dL — AB (ref 65–99)

## 2015-10-07 LAB — TROPONIN I: TROPONIN I: 0.62 ng/mL — AB (ref <0.03)

## 2015-10-07 MED ORDER — CLOPIDOGREL BISULFATE 75 MG PO TABS: 75.0000 mg | ORAL_TABLET | Freq: Every day | ORAL | Status: DC

## 2015-10-07 MED ORDER — GLIPIZIDE 5 MG PO TABS: 5.0000 mg | ORAL_TABLET | Freq: Two times a day (BID) | ORAL | Status: DC

## 2015-10-07 MED ORDER — THIAMINE HCL 100 MG PO TABS: 100.0000 mg | ORAL_TABLET | Freq: Every day | ORAL | Status: DC

## 2015-10-07 MED ORDER — LISINOPRIL 5 MG PO TABS: 5.0000 mg | ORAL_TABLET | Freq: Every day | ORAL | Status: DC

## 2015-10-07 MED ORDER — RIVAROXABAN 15 MG PO TABS: 15.0000 mg | ORAL_TABLET | Freq: Two times a day (BID) | ORAL | Status: DC

## 2015-10-07 MED ORDER — FUROSEMIDE 40 MG PO TABS: 40.0000 mg | ORAL_TABLET | Freq: Every day | ORAL | Status: DC

## 2015-10-07 MED ORDER — SPIRONOLACTONE 25 MG PO TABS: 12.5000 mg | ORAL_TABLET | Freq: Every day | ORAL | Status: DC

## 2015-10-07 MED ORDER — RIVAROXABAN 15 MG PO TABS
15.0000 mg | ORAL_TABLET | Freq: Two times a day (BID) | ORAL | Status: DC
  Administered 2015-10-07: 11:00:00 15 mg via ORAL
  Filled 2015-10-07: qty 1

## 2015-10-07 MED ORDER — FOLIC ACID 1 MG PO TABS: 1.0000 mg | ORAL_TABLET | Freq: Every day | ORAL | Status: DC

## 2015-10-07 MED ORDER — CARVEDILOL 6.25 MG PO TABS: 6.2500 mg | ORAL_TABLET | Freq: Two times a day (BID) | ORAL | Status: DC

## 2015-10-07 MED ORDER — ADULT MULTIVITAMIN W/MINERALS CH: 1.0000 | ORAL_TABLET | Freq: Every day | ORAL | Status: DC

## 2015-10-07 MED ORDER — ADULT MULTIVITAMIN W/MINERALS CH: 1.0000 | ORAL_TABLET | Freq: Every day | ORAL | Status: AC

## 2015-10-07 NOTE — Discharge Instructions (Addendum)
You were admitted for DVT and Coronary artery disease (requiring stent placement).  You were seen by pulmonology and cardiology as well as followed by the [facility] Medicine team.  You are being sent home with several new medications.   Please keep your appointments, as your diabetes medications will likely be changed going forward.  Information on my medicine - XARELTO (rivaroxaban)  This medication education was reviewed with me or my healthcare representative as part of my discharge preparation.   WHY WAS XARELTO PRESCRIBED FOR YOU? Xarelto was prescribed to treat blood clots that may have been found in the veins of your legs (deep vein thrombosis) or in your lungs (pulmonary embolism) and to reduce the risk of them occurring again.  What do you need to know about Xarelto? The starting dose is one 15 mg tablet taken TWICE daily with food for the FIRST 21 DAYS then on  07/29  the dose is changed to one 20 mg tablet taken ONCE A DAY with your evening meal.  DO NOT stop taking Xarelto without talking to the health care provider who prescribed the medication.  Refill your prescription for 20 mg tablets before you run out.  After discharge, you should have regular check-up appointments with your healthcare provider that is prescribing your Xarelto.  In the future your dose may need to be changed if your kidney function changes by a significant amount.  What do you do if you miss a dose? If you are taking Xarelto TWICE DAILY and you miss a dose, take it as soon as you remember. You may take two 15 mg tablets (total 30 mg) at the same time then resume your regularly scheduled 15 mg twice daily the next day.  If you are taking Xarelto ONCE DAILY and you miss a dose, take it as soon as you remember on the same day then continue your regularly scheduled once daily regimen the next day. Do not take two doses of Xarelto at the same time.   Important Safety Information Xarelto is a blood thinner  medicine that can cause bleeding. You should call your healthcare provider right away if you experience any of the following: ? Bleeding from an injury or your nose that does not stop. ? Unusual colored urine (red or dark brown) or unusual colored stools (red or black). ? Unusual bruising for unknown reasons. ? A serious fall or if you hit your head (even if there is no bleeding).  Some medicines may interact with Xarelto and might increase your risk of bleeding while on Xarelto. To help avoid this, consult your healthcare provider or pharmacist prior to using any new prescription or non-prescription medications, including herbals, vitamins, non-steroidal anti-inflammatory drugs (NSAIDs) and supplements.  This website has more information on Xarelto: [email or web address].  PLEASE REMEMBER TO BRING ALL OF YOUR MEDICATIONS TO EACH OF YOUR FOLLOW-UP OFFICE VISITS.  PLEASE ATTEND ALL SCHEDULED FOLLOW-UP APPOINTMENTS.   Activity: Increase activity slowly as tolerated. You may shower, but no soaking baths (or swimming) for 1 week. No driving for 2 days. No lifting over 5 lbs for 1 week. No sexual activity for 1 week.   You May Return to Work: in 1 week (if applicable)  Wound Care: You may wash cath site gently with soap and water. Keep cath site clean and dry. If you notice pain, swelling, bleeding or pus at your cath site, please call [phone].    Cardiac Cath Site Care Refer to this sheet in the next few weeks.  These instructions provide you with information on caring for yourself after your procedure. Your caregiver may also give you more specific instructions. Your treatment has been planned according to current medical practices, but problems sometimes occur. Call your caregiver if you have any problems or questions after your procedure. HOME CARE INSTRUCTIONS  You may shower 24 hours after the procedure. Remove the bandage (dressing) and gently wash the site with plain soap and water.  Gently pat the site dry.   Do not apply powder or lotion to the site.   Do not sit in a bathtub, swimming pool, or whirlpool for 5 to 7 days.   No bending, squatting, or lifting anything over 10 pounds (4.5 kg) as directed by your caregiver.   Inspect the site at least twice daily.   Do not drive home if you are discharged the same day of the procedure. Have someone else drive you.   You may drive 24 hours after the procedure unless otherwise instructed by your caregiver.  What to expect:  Any bruising will usually fade within 1 to 2 weeks.   Blood that collects in the tissue (hematoma) may be painful to the touch. It should usually decrease in size and tenderness within 1 to 2 weeks.  SEEK IMMEDIATE MEDICAL CARE IF:  You have unusual pain at the site or down the affected limb.   You have redness, warmth, swelling, or pain at the site.   You have drainage (other than a small amount of blood on the dressing).   You have chills.   You have a fever or persistent symptoms for more than 72 hours.   You have a fever and your symptoms suddenly get worse.   Your leg becomes pale, cool, tingly, or numb.   You have heavy bleeding from the site. Hold pressure on the site.  Document Released: 04/20/2010 Document Revised: 03/07/2011 Document Reviewed: 04/20/2010 [facility] Patient Information 2012 [employer], [place].

## 2015-10-07 NOTE — Progress Notes (Signed)
CARDIAC REHAB PHASE I   PRE:  Rate/Rhythm: Sinus 67  BP:    Sitting: 118/72    SaO2: 98% Room Air  MODE:  Ambulation: 600 ft   POST:  Rate/Rhythem: Sinus 92  BP:    Sitting: 122/82     SaO2: 97% Room Air  [phone] Patient ambulated independently in the hallway with his family present. Reviewed exercise instructions, RPE scale, temperature precautions. Heart healthy, low sodium, diabetic diet education discussed with the patient and his family. Mr, Donnelly AngelicaShauer says he is interested in participating in phase 2 cardiac rehab. Patient has stent card. Talked with the patient about the use of sublingual nitroglycerin and when to call 911.    Thayer HeadingsMaria Walden Whitaker RN BSN

## 2015-10-07 NOTE — Progress Notes (Signed)
Patient Name: Robert Hampton Date of Encounter: 10/07/2015  Principal Problem:   Acute respiratory failure with hypoxia (HCC) Active Problems:   Uncontrolled type 2 DM- on oral agent    Acute systolic CHF    Anasarca   Elevated liver enzymes   CAP (community acquired pneumonia)-LUL   Cardiomyopathy- etiology not yet determined- appears to be NICM   ETOH abuse-daily drinker   Smoker-quit "9 days ago"   DVT-Rt lower extremity (HCC)   Pulmonary hypertension (HCC)   Abnormal CT scan of lung   Abnormal nuclear stress test   CHF (congestive heart failure) (HCC)   PNA (pneumonia)   Coronary artery disease due to lipid rich plaque   LV dysfunction   Primary Cardiologist: Dr Tresa Endo Patient Profile: 60 yo male with PMH of DM II and HTN who presented 06/28 with 3 weeks onset of increasing SOB. He was diagnosed with LUL PNA, possible mass in the lung, acute HF, elevated transaminases, R leg DVT. Echo w/ low EF 30-35%. WBC close to 20. Stabilized for MV>>abnl>> OK for cath by 07/05>>multivessel dz>>CABG not an option w/ new DVT and PNA>>had PCI 07/07  SUBJECTIVE: Feels great, wants to go home.   OBJECTIVE Filed Vitals:   10/06/15 1950 10/06/15 2000 10/07/15 0500 10/07/15 0710  BP: 119/67  148/87 125/81  Pulse: 85  94 80  Temp: 98.1 F (36.7 C)  97.4 F (36.3 C) 98.1 F (36.7 C)  TempSrc: Oral  Oral Oral  Resp: 30 20 20 24   Height:      Weight:   214 lb 11.7 oz (97.4 kg)   SpO2: 95%  97% 97%    Intake/Output Summary (Last 24 hours) at 10/07/15 0809 Last data filed at 10/07/15 0734  Gross per 24 hour  Intake    700 ml  Output   1750 ml  Net  -1050 ml   Filed Weights   10/05/15 0500 10/06/15 0419 10/07/15 0500  Weight: 214 lb 4.8 oz (97.206 kg) 210 lb 11.2 oz (95.573 kg) 214 lb 11.7 oz (97.4 kg)    PHYSICAL EXAM General: Well developed, well nourished, male in no acute distress. Head: Normocephalic, atraumatic.  Neck: Supple without bruits, JVD. Lungs:  Resp  regular and unlabored, decreased BS L base, few rales R. Heart: RRR, S1, S2, no S3, S4, or murmur; no rub. Abdomen: Soft, non-tender, non-distended, BS + x 4.  Extremities: No clubbing, cyanosis, edema. R radial cath site without ecchymosis or hematoma Neuro: Alert and oriented X 3. Moves all extremities spontaneously. Psych: Normal affect.  LABS: CBC: Recent Labs  10/06/15 0353 10/07/15 0548  WBC 13.0* 14.0*  HGB 16.5 16.5  HCT 50.2 49.8  MCV 94.4 94.5  PLT 353 357   INR: Recent Labs  10/05/15 2136  INR 1.25   Basic Metabolic Panel: Recent Labs  10/06/15 0353 10/07/15 0548  NA 134* 133*  K 3.9 4.2  CL 96* 100*  CO2 28 25  GLUCOSE 139* 199*  BUN 15 13  CREATININE 0.85 0.79  CALCIUM 8.8* 9.2  MG  --  1.8   Liver Function Tests: Recent Labs  10/07/15 0548  AST 22  ALT 44  ALKPHOS 88  BILITOT 1.2  PROT 6.5  ALBUMIN 2.7*   Cardiac Enzymes: Recent Labs  10/06/15 1050 10/07/15 0548  TROPONINI 0.03* 0.62*   BNP:  B NATRIURETIC PEPTIDE  Date/Time Value Ref Range Status  09/30/2015 05:14 AM 1061.5* 0.0 - 100.0 pg/mL Final  09/27/2015 04:22  PM 1788.2* 0.0 - 100.0 pg/mL Final    TELE: SR, occ PVCs       ECG: SR, no sig change from 06/29  Cardiac Cath: 10/06/2015  Ost 2nd Diag to 2nd Diag lesion, 100% stenosed.  Ost RPDA to RPDA lesion, 75% stenosed.  Prox Cx lesion, 50% stenosed.  Mid LAD lesion, 80% stenosed. Post intervention, there is a 0% residual stenosis.  Prox Cx to Mid Cx lesion, 80% stenosed. Post intervention, there is a 0% residual stenosis.  Prox RCA to Mid RCA lesion, 90% stenosed. Post intervention, there is a 0% residual stenosis. Successful 3 vessel PCI using drug-eluting stents in the mid-RCA, mid-circumflex, and mid-LAD Residual occlusion with collateral filling of the distal circumflex and second diagonal (both small branches) Recommend: Pt can start on oral anticoagulation tonight as long as radial site is clear. Would  treat with ASA 81 mg and plavix 75 mg x 1 month, then DC ASA. Continue clopidogrel and oral anticoagulation after ASA discontinued. Total contrast = 105 cc  Radiology/Studies: Dg Chest 2 View 10/03/2015  CLINICAL DATA:  I50.9 (ICD-10-CM) - CHF (congestive heart failure) (HCC) J18.9 (ICD-10-CM) - PNA (pneumonia)Cough  cp EXAM: CHEST  2 VIEW COMPARISON:  09/27/2015 FINDINGS: Since the prior exam, worsened consolidation has developed at the left lung base now obscuring the hemidiaphragm and portions of the left heart border. Consolidation in the left upper lobe is similar to the prior exam. The right lung remains essentially clear. No convincing pleural effusion.  No pneumothorax. Cardiac silhouette is normal in size. No mediastinal or hilar masses or convincing adenopathy. IMPRESSION: 1. Worsened multifocal pneumonia on the left when compared with the prior study. There is increased consolidation in the left lower lobe, likely involving portions of the posterior left upper lobe lingula. Left upper lobe consolidation is without significant change. Electronically Signed   By: Amie Portlandavid  Ormond M.D.   On: 10/03/2015 08:58   Ct Angio Chest Pe W Or Wo Contrast 09/28/2015  CLINICAL DATA:  60 year old male with shortness of breath. EXAM: CT ANGIOGRAPHY CHEST WITH CONTRAST TECHNIQUE: Multidetector CT imaging of the chest was performed using the standard protocol during bolus administration of intravenous contrast. Multiplanar CT image reconstructions and MIPs were obtained to evaluate the vascular anatomy. CONTRAST:  100 cc Isovue 370 COMPARISON:  Chest radiograph dated 09/27/2015 FINDINGS: Evaluation of this exam is limited due to respiratory motion artifact. There is an area of ground-glass on nodular airspace opacity involving the posterior apical segment of the left upper lobe most compatible with pneumonia. Right apical nodular and ground-glass density is also noted to a lesser degree. There is mild diffuse interstitial  prominence which may represent mild edema. There is moderate left and small right pleural effusions. No pneumothorax. The central airways are patent. There is minimal atherosclerotic calcification of the thoracic aorta. There is apparent peripheral filling defect in the left pulmonary artery at the bifurcation to the upper and lower lobe (series 5 image 118 - 131). This may represent pulmonary artery embolus or mass effect and compression or invasion by a centrally located left hilar mass. An ill-defined 2.0 x 1.5 cm density in the left hilar region (series 5, image 107) may represent combination of adenopathy and consolidated lung or a centrally located/partially obstructing mass. Follow-up with CT is recommended to ensure resolution of the left lung opacity and exclude an underlying hilar mass. No other central pulmonary artery embolus identified. Evaluation of the distal branches of the pulmonary arteries is limited due  to respiratory motion artifact and suboptimal visualization. There is no cardiomegaly or pericardial effusion. Left hilar adenopathy. The esophagus is grossly unremarkable with there is a small left thyroid hypodense nodule. Ultrasound may provide better evaluation. There is no axillary adenopathy. There is diffuse subcutaneous soft tissue stranding and edema the chest wall no fluid collection. There is degenerative changes of the spine. No acute fracture. There is retrograde flow of contrast from the right atrium into the IVC suggestive of a degree of right cardiac dysfunction. A 1.9 cm left renal upper pole hypodense lesion is not well characterized. This is stable compared to the CT dated 11/18/2013 and likely represents a cyst. Review of the MIP images confirms the above findings. IMPRESSION: Left upper lobe pneumonia. An ill-defined centrally located left hilar density may represent combination of adenopathy and consolidated portion of the lung versus a hilar mass. Follow-up is recommended to  document resolution of the airspace opacity and exclude underlying hilar mass. Apparent peripheral filling defect in the left pulmonary artery at the bifurcation of the left upper and left lower lobe branches likely represent extrinsic compression on the vessel by the hilar adenopathy/ mass or invasion of a left hilar lesion into the adjacent pulmonary artery. Acute pulmonary embolus is less likely, given peripheral orientation, but not entirely excluded. Interstitial edema and bilateral pleural effusions, left greater right. These results were called by telephone at the time of interpretation on 09/28/2015 at 12:52 am to Dr. Wende Mott, who verbally acknowledged these results. Electronically Signed   By: Elgie Collard M.D.   On: 09/28/2015 00:56   Nm Myocar Multi W/spect W/wall Motion / Ef 10/02/2015   There was no ST segment deviation noted during stress.  T wave inversion was noted during stress in the V2 leads, beginning at 0 minutes of stress. T wave inversion persisted.  Defect 1: There is a large defect of severe severity present in the basal inferior, basal inferolateral, mid inferior, mid inferolateral, apical inferior and apical lateral location.  Findings consistent with prior Inferior / Inferolateral myocardial infarction. There is no evidence of ischemia .  Nuclear stress EF: 22%.  This is a high risk study.  The left ventricular ejection fraction is severely decreased (<30%).    Current Medications:  . aspirin EC  81 mg Oral Daily  . carvedilol  6.25 mg Oral BID WC  . clopidogrel  75 mg Oral Q breakfast  . folic acid  1 mg Oral Daily  . furosemide  40 mg Oral Daily  . insulin aspart  0-15 Units Subcutaneous TID WC  . insulin aspart  0-5 Units Subcutaneous QHS  . insulin glargine  7 Units Subcutaneous Daily  . lisinopril  5 mg Oral Daily  . multivitamin with minerals  1 tablet Oral Daily  . potassium chloride  20 mEq Oral Daily  . rivaroxaban  20 mg Oral Q supper  . sodium chloride  flush  3 mL Intravenous Q12H  . sodium chloride flush  3 mL Intravenous Q12H  . spironolactone  12.5 mg Oral Daily  . thiamine  100 mg Oral Daily      ASSESSMENT AND PLAN:   Acute systolic CHF  - continue Lasix 40 mg qd - K+ is trending up, consider decreasing dose to 10 meq qd - MD advise on LifeVest - on ACE/BB, spiro - consider Entresto, timing per MD    Coronary artery disease due to lipid rich plaque - s/p 3 v intervention - on ASA/Plavix - also  on Coreg 6.25 mg bid and lisinopril 5 mg - renal function stable post-cath - message for f/u sent to office - MD advise on increasing Coreg, SBP 100s-150s - will need nitro at d/c  Otherwise, per IM. Message has been sent to office to arrange f/u appt. Principal Problem:   Acute respiratory failure with hypoxia (HCC) Active Problems:   Uncontrolled type 2 DM- on oral agent    Anasarca   Elevated liver enzymes   CAP (community acquired pneumonia)-LUL   Cardiomyopathy- etiology not yet determined- appears to be NICM   ETOH abuse-daily drinker   Smoker-quit "9 days ago"   DVT-Rt lower extremity (HCC)   Pulmonary hypertension (HCC)   Abnormal CT scan of lung   Abnormal nuclear stress test   CHF (congestive heart failure) (HCC)   PNA (pneumonia)   LV dysfunction   Signed, Barrett, Rhonda , PA-C 8:09 AM 10/07/2015  I have seen and examined the patient along with Barrett, Rhonda , PA-C.  I have reviewed the chart, notes and new data.  I agree with PA/NP's note.  Key new complaints: never had angina that he recognized as such; has recumbent cough, but no dyspnea at rest Key examination changes: 40 lb weight reduction with diuresis, Key new findings / data: improving WBC, normal creat and K with diuresis  PLAN: Daily weights and sodium restriction, signs/sxs of HF exacerbation reviewed. Notwithstanding increased bleeding risk, I think he will benefit from short term "triple therapy" (ASA, clopidogrel, Xarelto in PE dose).  After one month, stop ASA. Xarelto would be temporarily interrupted and then resumed for a total duration of at least 6 months, preferably 12 months. Continue carvedilol and lisinopril. Will arrange early TCM Cardiology appointment and titrate meds further. Abstain from alcohol and smoking.  Thurmon Fair, MD, [facility] [phone] 10/07/2015, 9:08 AM

## 2015-10-07 NOTE — Care Management Note (Addendum)
Case Management Note  Patient Details  Name: Robert GeneraJonathan Hampton MRN: [id number] Date of Birth: [birth year]  Subjective/Objective:                  Acute respiratory failure with hypoxia [facility](HCC) Active Problems:  Uncontrolled type 2 DM- on oral agent   Acute systolic CHF   Action/Plan: CM spoke to patient and family (spouse and daughter) at the bedside. Cm provided the 30 day free Xarelto card and explained the importance of compliance of this medication. Cm advised that for refill or change of medication to follow up with Cardiology as planned. Pt verbalized concern about not having a glucometer or test strips and if he would be going home on insulin. CM communicated concerns to RN and Family Medicine was called to come by and see patient to finalize discharge plan. CM gave member information on Relion glucometer and test strips that [facility] and member said that he would be able to obtain that. Patient takes [facility], also able to be afforded by patient. CM provided Plavix goodrx coupon to patient for $12 at [facility] also able to be afforded by patient per conversation. Lisinopril, lasix, and coreg all on [facility] $4 list.   Cm reinforced importance [facility] along with assistance with medications as he is uninsured. Patient and family verbalized understanding.   Patient denied any further needs at this time but CM remains available should additional needs arise.   Expected Discharge Date:  10/07/15               Expected Discharge Plan:  Home/Self Care  In-House Referral:     Discharge planning Services  CM Consult, Medication Assistance, Indigent Health Clinic  Post Acute Care Choice:  NA Choice offered to:     DME Arranged:    DME Agency:     HH Arranged:    HH Agency:     Status of Service:  Completed, signed off  If discussed at [employer] [facility] [employer]  Meetings, dates discussed:    Additional Comments:  Darcel SmallingAnna C Tarence Searcy, RN 10/07/2015, 9:58 AM

## 2015-10-09 MED FILL — Heparin Sodium (Porcine) 2 Unit/ML in Sodium Chloride 0.9%: INTRAMUSCULAR | Qty: 500 | Status: AC

## 2015-10-10 ENCOUNTER — Telehealth: Payer: Self-pay | Admitting: Cardiovascular Disease

## 2015-10-10 NOTE — Telephone Encounter (Signed)
TCM Phone Call. Appt on 10/24/15 at 9am w/ Nada BoozerLaura Ingold [facility] [address] [facility]   Thanks

## 2015-10-11 NOTE — Telephone Encounter (Signed)
[facility].  Patient understands to follow up with provider Nada BoozerLaura Ingold, NP on 10/24/15 at [facility]a [facility] Patient understands discharge instructions? yes Patient understands medications and regiment? yes Patient understands to bring all medications to this visit? yes

## 2015-10-16 ENCOUNTER — Encounter: Payer: Self-pay | Admitting: Family Medicine

## 2015-10-16 ENCOUNTER — Ambulatory Visit (INDEPENDENT_AMBULATORY_CARE_PROVIDER_SITE_OTHER): Payer: Self-pay | Admitting: Family Medicine

## 2015-10-16 VITALS — BP 110/68 | HR 98 | Temp 98.3°F | Wt 199.0 lb

## 2015-10-16 DIAGNOSIS — E1110 Type 2 diabetes mellitus with ketoacidosis without coma: Secondary | ICD-10-CM

## 2015-10-16 DIAGNOSIS — E1310 Other specified diabetes mellitus with ketoacidosis without coma: Secondary | ICD-10-CM

## 2015-10-16 DIAGNOSIS — I5020 Unspecified systolic (congestive) heart failure: Secondary | ICD-10-CM

## 2015-10-16 DIAGNOSIS — I82401 Acute embolism and thrombosis of unspecified deep veins of right lower extremity: Secondary | ICD-10-CM

## 2015-10-16 DIAGNOSIS — Z87891 Personal history of nicotine dependence: Secondary | ICD-10-CM

## 2015-10-16 MED ORDER — METFORMIN HCL 500 MG PO TABS: 1000.0000 mg | ORAL_TABLET | Freq: Two times a day (BID) | ORAL | Status: DC

## 2015-10-16 MED ORDER — RIVAROXABAN 20 MG PO TABS: 20.0000 mg | ORAL_TABLET | Freq: Two times a day (BID) | ORAL | Status: DC

## 2015-10-16 NOTE — Assessment & Plan Note (Signed)
Quit 09/27/15, has not had any additional cigarettes since hospital discharge. Encouraged continued abstaining from smoking.

## 2015-10-16 NOTE — Assessment & Plan Note (Deleted)
Plans to see cardiology 10/24/15

## 2015-10-16 NOTE — Patient Instructions (Addendum)
Thank you for coming in. Today we talked about your diabetes. Please start metformin and keep a log of your morning fasting blood sugars. When starting metformin start with  one a day for 2-3 days, then increase to   twice a day for 2-3 more days, then take  twice a day. Please call us if you have any low sugars.  Today we also talked about your medication refills, please try to get on the Medication Assistance Program. If you cannot get assistance please let us know so we can start a different medication.  Please return for follow up in 1 month.  Basic Carbohydrate Counting for Diabetes Mellitus Carbohydrate counting is a method for keeping track of the amount of carbohydrates you eat. Eating carbohydrates naturally increases the level of sugar (glucose) in your blood, so it is important for you to know the amount that is okay for you to have in every meal. Carbohydrate counting helps keep the level of glucose in your blood within normal limits. The amount of carbohydrates allowed is different for every person. A dietitian can help you calculate the amount that is right for you. Once you know the amount of carbohydrates you can have, you can count the carbohydrates in the foods you want to eat. Carbohydrates are found in the following foods:  Grains, such as breads and cereals.  Dried beans and soy products.  Starchy vegetables, such as potatoes, peas, and corn.  Fruit and fruit juices.  Milk and yogurt.  Sweets and snack foods, such as cake, cookies, candy, chips, soft drinks, and fruit drinks. CARBOHYDRATE COUNTING There are two ways to count the carbohydrates in your food. You can use either of the methods or a combination of both. Reading the "Nutrition Facts" on Packaged Food The "Nutrition Facts" is an area that is included on the labels of almost all packaged food and beverages in the [place]. It includes the serving size of that food or beverage and information  about the nutrients in each serving of the food, including the grams (g) of carbohydrate per serving.  Decide the number of servings of this food or beverage that you will be able to eat or drink. Multiply that number of servings by the number of grams of carbohydrate that is listed on the label for that serving. The total will be the amount of carbohydrates you will be having when you eat or drink this food or beverage. Learning Standard Serving Sizes of Food When you eat food that is not packaged or does not include "Nutrition Facts" on the label, you need to measure the servings in order to count the amount of carbohydrates.A serving of most carbohydrate-rich foods contains about 15 g of carbohydrates. The following list includes serving sizes of carbohydrate-rich foods that provide 15 g ofcarbohydrate per serving:   1 slice of bread (1 oz) or 1 six-inch tortilla.    of a hamburger bun or English muffin.  4-6 crackers.   cup unsweetened dry cereal.    cup hot cereal.   cup rice or pasta.    cup mashed potatoes or  of a large baked potato.  1 cup fresh fruit or one small piece of fruit.    cup canned or frozen fruit or fruit juice.  1 cup milk.   cup plain fat-free yogurt or yogurt sweetened with artificial sweeteners.   cup cooked dried beans or starchy vegetable, such as peas, corn, or potatoes.  Decide the number of standard-size servings  that you will eat. Multiply that number of servings by 15 (the grams of carbohydrates in that serving). For example, if you eat 2 cups of strawberries, you will have eaten 2 servings and 30 g of carbohydrates (2 servings x 15 g = 30 g). For foods such as soups and casseroles, in which more than one food is mixed in, you will need to count the carbohydrates in each food that is included. EXAMPLE OF CARBOHYDRATE COUNTING Sample Dinner  3 oz chicken breast.   cup of brown rice.   cup of corn.  1 cup milk.   1 cup  strawberries with sugar-free whipped topping.  Carbohydrate Calculation Step 1: Identify the foods that contain carbohydrates:   Rice.   Corn.   Milk.   Strawberries. Step 2:Calculate the number of servings eaten of each:   2 servings of rice.   1 serving of corn.   1 serving of milk.   1 serving of strawberries. Step 3: Multiply each of those number of servings by 15 g:   2 servings of rice x 15 g = 30 g.   1 serving of corn x 15 g = 15 g.   1 serving of milk x 15 g = 15 g.   1 serving of strawberries x 15 g = 15 g. Step 4: Add together all of the amounts to find the total grams of carbohydrates eaten: 30 g + 15 g + 15 g + 15 g = 75 g.   This information is not intended to replace advice given to you by your health care provider. Make sure you discuss any questions you have with your health care provider.   Document Released: 03/18/2005 Document Revised: 04/08/2014 Document Reviewed: 02/12/2013 Elsevier Interactive Patient Education [employer].

## 2015-10-16 NOTE — Assessment & Plan Note (Signed)
Start metformin slowly. 500mg  qd for a few days, increase to 500mg  bid for a few more days, with goal of 1000mg  bid.  Will bring back morning fasting blood sugar readings

## 2015-10-16 NOTE — Progress Notes (Signed)
Subjective:  Robert Hampton is a 60 y.o. male who [facility] today for a hospital follow up where he was recently diagnosed with new CHF, ischemic cardiomyopathy and R LE DVT.  HPI: CHF Patient states has been doing since hospital discharge, is no longer short of breath at rest or having leg swelling. States occasionally has shortness of breath on exertion but feels is slowly improving and is able to ambulate and get exercise. Denies chest pain. Has been compliant on DAPT started in the hospital and HTN meds. Has appt with cardiology on 10/24/15. 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   T2DM Has log today. Am fasting sugars 141-246, avg 170-180s. Has been compliant on glipizide at home. Is amenable to starting metformin today.  R LE DVT Has been taking xarelto with trial card but is concerned about being able to afford it. Is okay with starting coumadin if cannot get medication assistance.   ROS: SOB on exertion, otherwise all systems reviewed and are negative  PMH: The following were reviewed and entered/updated in epic: Past Medical History  Diagnosis Date  . Diverticulosis     shown on CT  . Coronary artery disease   . Acute respiratory failure with hypoxia (HCC) 09/2015  . Diabetes mellitus without complication (HCC)   . DVT (deep venous thrombosis) (HCC) 10/06/2015    RT LEG  . Abnormal CT scan of lung 08/2015  . Acute systolic HF (heart failure) (HCC) 09/2015   Patient Active Problem List   Diagnosis Date Noted  . S/P drug eluting coronary stent placement   . Coronary artery disease due to lipid rich plaque   . LV dysfunction   . CHF (congestive heart failure) (HCC)   . Abnormal nuclear stress test   . Abnormal CT scan of lung 09/30/2015  . CAP (community acquired pneumonia)-LUL 09/29/2015  . Ischemic cardiomyopathy 09/29/2015  . ETOH abuse-daily drinker 09/29/2015  . Former smoker  09/29/2015  . DVT-Rt lower extremity (HCC) 09/29/2015  . Pulmonary hypertension (HCC) 09/29/2015  . Swelling of both lower extremities   . Transaminitis   . Acute systolic CHF  09/27/2015  . Shortness of breath 09/27/2015  . Elevated liver enzymes   . Uncontrolled type 2 DM- on oral agent  09/19/2015   Past Surgical History  Procedure Laterality Date  . Knee surgery    . Cardiac catheterization N/A 10/04/2015    Procedure: Right/Left Heart Cath and Coronary Angiography;  Surgeon: Tonny BollmanMichael Cooper, MD;  [facility];  Service: Cardiovascular;  Laterality: N/A;  . Cardiac catheterization N/A 10/06/2015    Procedure: Coronary Stent Intervention;  Surgeon: Tonny BollmanMichael Cooper, MD;  [facility];  Service: Cardiovascular;  Laterality: N/A;  . Coronary stent placement  10/06/2015    Successful 3 vessel PCI using drug-eluting stents in the mid-RCA, mid-circumflex, and mid-LAD    No family history on file.  Medications- reviewed and updated Current Outpatient Prescriptions  Medication Sig Dispense Refill  . aspirin EC 81 MG tablet Take 81 mg by mouth daily.    . carvedilol (COREG) 6.25 MG tablet Take 1 tablet (6.25 mg total) by mouth 2 (two) times daily with a meal. 60 tablet 1  . clopidogrel (PLAVIX) 75 MG tablet Take 1 tablet (75 mg total) by mouth daily with breakfast. 30 tablet 1  . folic acid (FOLVITE) 1 MG tablet Take 1 tablet (  1 mg total) by mouth daily. 30 tablet 5  . furosemide (LASIX) 40 MG tablet Take 1 tablet (40 mg total) by mouth daily. 30 tablet 1  . glipiZIDE (GLUCOTROL) 5 MG tablet Take 1 tablet (5 mg total) by mouth 2 (two) times daily before a meal. 60 tablet 3  . lisinopril (PRINIVIL,ZESTRIL) 5 MG tablet Take 1 tablet (5 mg total) by mouth daily. 30 tablet 1  . metFORMIN (GLUCOPHAGE) 500 MG tablet Take 2 tablets (1,000 mg total) by mouth 2 (two) times daily with a meal. 120 tablet 1  . Multiple Vitamin (MULTIVITAMIN WITH MINERALS) TABS tablet Take 1  tablet by mouth daily. 30 tablet 1  . OVER THE COUNTER MEDICATION Emergen-C packets: Mix and drink one packet by mouth daily    . Rivaroxaban (XARELTO) 20 MG TABS tablet Take 1 tablet (20 mg total) by mouth 2 (two) times daily with a meal. In 1 week start taking  daily. 30 tablet 3  . spironolactone (ALDACTONE) 25 MG tablet Take 0.5 tablets (12.5 mg total) by mouth daily. 15 tablet 1  . thiamine 100 MG tablet Take 1 tablet (100 mg total) by mouth daily. 30 tablet 5   No current facility-administered medications for this visit.    Allergies-reviewed and updated Allergies  Allergen Reactions  . Bee Venom Anaphylaxis    Social History   Social History  . Marital Status: Married    Spouse Name: N/A  . Number of Children: N/A  . Years of Education: N/A   Social History Main Topics  . Smoking status: Former Smoker -- 40 years    Types: Cigarettes    Quit date: 09/27/2015  . Smokeless tobacco: Never Used  . Alcohol Use: Yes     Comment: daily   . Drug Use: No  . Sexual Activity: Not Asked   Other Topics Concern  . None   Social History Narrative    Objective:  Physical Exam: BP 110/68 mmHg  Pulse 98  Temp(Src) 98.3 F (36.8 C) (Oral)  Wt 199 lb (90.266 kg)  SpO2 99%  Gen: NAD, resting comfortably CV: RRR with no murmurs appreciated Pulm: NWOB, CTAB with no crackles, wheezes, or rhonchi GI: Normal bowel sounds present. Soft, Nontender, Nondistended. Extremities: no edema, cyanosis, or clubbing noted. 2+ DPs Skin: warm, dry Neuro: grossly normal, moves all extremities Psych: Normal affect and thought content    Hgb A1C (09/27/15) 10.4  Assessment/Plan:  Uncontrolled type 2 DM- on oral agent  Start metformin slowly.  qd for a few days, increase to  bid for a few more days, with goal of  bid.  Will bring back morning fasting blood sugar readings  DVT-Rt lower extremity (HCC) Is on one more week of xarelto  bid, then needs to be on   bid. Given MAP information. If cannot get approved may need to switch to coumadin.  Former smoker Quit 09/27/15, has not had any additional cigarettes since hospital discharge. Encouraged continued abstaining from smoking.   Cardiology appt 10/24/15 for CHF and ischemic cardiomyopathy s/p biofreedom stents  Pulmonology appt 10/25/15 for R hilar mass found on CT chest during hospital admission.   Health maintenance - get colonoscopy records from [facility].   Leland Her, DO [facility] Resident PGY-1 10/16/2015 2:41 PM

## 2015-10-16 NOTE — Assessment & Plan Note (Signed)
Is on one more week of xarelto 15mg  bid, then needs to be on 20mg  bid. Given MAP information. If cannot get approved may need to switch to coumadin.

## 2015-10-20 ENCOUNTER — Telehealth: Payer: Self-pay | Admitting: Pulmonary Disease

## 2015-10-20 NOTE — Telephone Encounter (Signed)
Called spoke with pt. appt scheduled for 7/26 w/ MW at 10:30. Nothing further needed

## 2015-10-23 NOTE — Progress Notes (Signed)
Cardiology Office Note   Date:  10/24/2015   ID:  Robert Hampton, DOB 07-28-1955, MRN [id number]  PCP:  Leland Her, DO  Cardiologist:  Dr. Copper per pt request.    Chief Complaint  Patient presents with  . Hospitalization Follow-up    for respiratory failure, cardiomyopathy and stents X 3 vessels.       History of Present Illness: Robert Hampton is a 60 y.o. male who presents for post hospitalization for acute resp failure, and acute systolic HF.  He was discharged 10/07/15.  On Echo his EF was 30-35% and mildly dilated LV global hypokinesis.  He was mild to moderate pulmonary hypertension with a PA pressure at 47 mm he had evidence for moderate tricuspid regurgitation.  His RV was poorly visualized.    He had a cath (7/5) after high risk myoview study after patient presented with new acute CHF. Was found to have severe 3 vessel CAD involving high grade stenoses of mid-RCA, mid-LAD, mid-circumflex and total occlusion of the second diagonal. Was taken for PCI with placement of Biofreedom drug elunting stents (Biofreedom trial) and will follow up with cardiology research nurses monitoring. Needs 1 month of DAPT (ASA81 and Plavix 75mg ). Then to stop the ASA.    Please note Consult with TCTS- Dr. Laneta Simmers was obtained prior to stents.   "Neoplasm would increase his risk of DVT. His coronary artery disease is amenable to CABG but I would not do CABG while he has an acute untreated DVT and presumed pneumonia. He would need to have at least 6 weeks of treatment for his DVT and complete resolution of his LUL infiltrate before going through CABG to minimize his risk. I think PCI would be a reasonable option since these lesions look suitable for that and it would allow immediate revascularization. It is not possible to tell how much of his LV dysfunction is due to ischemia and how much may be non-ischemic. His presenting symptoms suggest acute on chronic systolic heart failure and could be expect  to improve with revascularization. I reviewed the echo, cath and CT finding with the patient and his wife and answered their questions. He will need a repeat CT after treatment for pneumonia to be sure that this pulmonary process clears up and if it doesn't he will need further workup to rule out neoplasm which sometimes can look just like pneumonia."  TCM but outside window    RLE DVT: Lower extremity dopplers showed DVT (detailed result below). Patient was initially started on Heparin drip. Transitioned to Xarelto at 15mg  BID for 3 weeks.  He will need to transition to 20mg  dose. Has the 20 mg script.   Today some SOB, no fevers, no chest pain.    His wt is decreased, he would like to return to work.  He is walking some every day but does not have much energy.  No cough or fevers.  He has had rare cigarette since discharge and states he will not reuse.    Pt and his wife are under the impression he still needs CABG.  We discussed return to work, I believe to weak this week but pt's wife stated they were told it would be 6 weeks before he could return to work.    After 3 stents there is no need for CABG.       Past Medical History:  Diagnosis Date  . Abnormal CT scan of lung 08/2015  . Acute respiratory failure with hypoxia (HCC) 09/2015  .  Acute systolic HF (heart failure) (HCC) 09/2015  . Coronary artery disease   . Diabetes mellitus without complication (HCC)   . Diverticulosis    shown on CT  . DVT (deep venous thrombosis) (HCC) 10/06/2015   RT LEG    Past Surgical History:  Procedure Laterality Date  . CARDIAC CATHETERIZATION N/A 10/04/2015   Procedure: Right/Left Heart Cath and Coronary Angiography;  Surgeon: Tonny Bollman, MD;  Location: [facility] INVAS[facility] CV LAB;  Service: Cardiovascular;  Laterality: N/A;  . CARDIAC CATHETERIZATION N/A 10/06/2015   Procedure: Coronary Stent Intervention;  Surgeon: Tonny Bollman, MD;  [facility]: [facility] INVASIVE CV LAB;  Service: Cardiovascular;  Laterality:  N/A;  . CORONARY STENT PLACEMENT  10/06/2015   Successful 3 vessel PCI using drug-eluting stents in the mid-RCA, mid-circumflex, and mid-LAD  . KNEE SURGERY       Current Outpatient Prescriptions  Medication Sig Dispense Refill  . aspirin EC 81 MG tablet Take 81 mg by mouth daily.    . carvedilol (COREG) 6.25 MG tablet Take 1 tablet (6.25 mg total) by mouth 2 (two) times daily with a meal. 60 tablet 1  . clopidogrel (PLAVIX) 75 MG tablet Take 1 tablet (75 mg total) by mouth daily with breakfast. 30 tablet 1  . folic acid (FOLVITE) 1 MG tablet Take 1 tablet (1 mg total) by mouth daily. 30 tablet 5  . furosemide (LASIX) 40 MG tablet Take 1 tablet (40 mg total) by mouth daily. 30 tablet 1  . glipiZIDE (GLUCOTROL) 5 MG tablet Take 1 tablet (5 mg total) by mouth 2 (two) times daily before a meal. 60 tablet 3  . lisinopril (PRINIVIL,ZESTRIL) 5 MG tablet Take 1 tablet (5 mg total) by mouth daily. 30 tablet 1  . metFORMIN (GLUCOPHAGE) 500 MG tablet Take 2 tablets (1,000 mg total) by mouth 2 (two) times daily with a meal. 120 tablet 1  . Multiple Vitamin (MULTIVITAMIN WITH MINERALS) TABS tablet Take 1 tablet by mouth daily. 30 tablet 1  . OVER THE COUNTER MEDICATION Emergen-C packets: Mix and drink one packet by mouth daily    . Rivaroxaban (XARELTO) 20 MG TABS tablet Take 1 tablet (20 mg total) by mouth 2 (two) times daily with a meal. In 1 week start taking  daily. 30 tablet 3  . spironolactone (ALDACTONE) 25 MG tablet Take 0.5 tablets (12.5 mg total) by mouth daily. 15 tablet 1  . thiamine 100 MG tablet Take 1 tablet (100 mg total) by mouth daily. 30 tablet 5   No current facility-administered medications for this visit.     Allergies:   Bee venom    Social History:  The patient  reports that he quit smoking about 3 weeks ago. His smoking use included Cigarettes. He quit after 40.00 years of use. He has never used smokeless tobacco. He reports that he drinks alcohol. He reports that he  does not use drugs.   Family History:  The patient's family history includes Heart attack in his mother; Hypertension in his mother.    ROS:  General:no colds or fevers, + weight loss  Skin:no rashes or ulcers HEENT:no blurred vision, no congestion CV:see HPI PUL:see HPI GI:no diarrhea constipation or melena, no indigestion GU:no hematuria, no dysuria MS:no joint pain, no claudication Neuro:no syncope, no lightheadedness Endo:+ diabetes, no thyroid disease  Wt Readings from Last 3 Encounters:  10/24/15 200 lb 6.4 oz (90.9 kg)  10/16/15 199 lb (90.3 kg)  10/07/15 214 lb 11.7 oz (97.4 kg)  PHYSICAL EXAM: VS:  BP 102/62   Pulse 84   Ht 6' (1.829 m)   Wt 200 lb 6.4 oz (90.9 kg)   BMI 27.18 kg/m  , BMI Body mass index is 27.18 kg/m. General:Pleasant affect, NAD Skin:Warm and dry, brisk capillary refill HEENT:normocephalic, sclera clear, mucus membranes moist Neck:supple, no JVD, no bruits  Heart:S1S2 RRR without murmur, gallup, rub or click Lungs:clear without rales, rhonchi, or wheezes VOZ:DGUY, non tender, + BS, do not palpate liver spleen or masses Ext:no lower ext edema, 2+ pedal pulses, 2+ radial pulses Neuro:alert and oriented X 3, MAE, follows commands, + facial symmetry    EKG:  EKG is ordered today. The ekg ordered today demonstrates SR and improved from hospital EKGs.     Recent Labs: 09/28/2015: TSH 2.456 09/30/2015: B Natriuretic Peptide 1,061.5 10/07/2015: ALT 44; BUN 13; Creatinine, Ser 0.79; Hemoglobin 16.5; Magnesium 1.8; Platelets 357; Potassium 4.2; Sodium 133    Lipid Panel No results found for: CHOL, TRIG, HDL, CHOLHDL, VLDL, LDLCALC, LDLDIRECT     Other studies Reviewed: Additional studies/ records that were reviewed today include: . Procedures   Right/Left Heart Cath and Coronary Angiography  Conclusion    Ost 2nd Diag to 2nd Diag lesion, 100% stenosed.  Mid LAD lesion, 80% stenosed.  Prox RCA to Mid RCA lesion, 90% stenosed.  Ost  RPDA to RPDA lesion, 75% stenosed.  Prox Cx lesion, 50% stenosed.  Prox Cx to Mid Cx lesion, 80% stenosed.   1. Severe 3 vessel CAD involving high-grade stenoses of the mid-RCA, mid-LAD, and mid-circumflex, and total occlusion of the second diagonal 2. Known severe LV systolic dysfunction  Complex situation in patient with acute DVT, possible lung malignancy, acute heart failure, and pneumonia. He also has newly diagnosed diabetes with HgB A1C > 10 mg/dL. I discussed his case with the primary service, pulmonary service, and placed a consultation with TCTS for consideration of CABG. Plans for FU CT imaging after completed treatment for pneumonia per pulmonary service. Will resume IV heparin post-cath until we decide on best revascularization strategy. If he is not a candidate for CABG, could do PCI with drug-eluting stents or with Biofreedom stents (throught the [employer]-Free trial requiring 30 days of DAPT per protocol).     Procedures   Coronary Stent Intervention  Conclusion    Ost 2nd Diag to 2nd Diag lesion, 100% stenosed.  Ost RPDA to RPDA lesion, 75% stenosed.  Prox Cx lesion, 50% stenosed.  Mid LAD lesion, 80% stenosed. Post intervention, there is a 0% residual stenosis.  Prox Cx to Mid Cx lesion, 80% stenosed. Post intervention, there is a 0% residual stenosis.  Prox RCA to Mid RCA lesion, 90% stenosed. Post intervention, there is a 0% residual stenosis.   Successful 3 vessel PCI using drug-eluting stents in the mid-RCA, mid-circumflex, and mid-LAD Residual occlusion with collateral filling of the distal circumflex and second diagonal (both small branches)  Recommend: Pt can start on oral anticoagulation tonight as long as radial site is clear. Would treat with ASA 81 mg and plavix 75 mg x 1 month, then DC ASA. Continue clopidogrel and oral anticoagulation after ASA discontinued.    ECHO: 09/28/15 Study Conclusions  - Left ventricle: The cavity size was mildly  dilated. Wall   thickness was normal. Systolic function was moderately to   severely reduced. The estimated ejection fraction was in the   range of 30% to 35%. Diffuse hypokinesis. The study is not   technically sufficient to allow evaluation  of LV diastolic   function. - Mitral valve: Mildly thickened leaflets . There was mild   regurgitation. - Left atrium: The atrium was normal in size. - Right ventricle: Poorly visualized. - Right atrium: The atrium was normal in size. - Tricuspid valve: There was moderate regurgitation. - Pulmonary arteries: PA peak pressure: 47 mm Hg (S). - Inferior vena cava: The vessel was dilated. The respirophasic   diameter changes were blunted (< 50%), consistent with elevated   central venous pressure. - Pericardium, extracardiac: There was no pericardial effusion.  Impressions:  - LVEF 30-35%, mildly dilated with global hypokinesis, mild MR,   normal biatrial size, moderate TR, RVSP 47 mmHg, dilated IVC, no   pericardial effusion.  ASSESSMENT AND PLAN:  1.  CAD s/p stent X 3 vessels.  This was done instead of CABG.  I discussed with Dr. Excell Seltzer, plan is for medical therapy now that vessels are open. Once his energy returns and depending on Dr. Thurston Hole findings pt may return to work from cardiology perspective.    I called pt and discussed our plans no need for CABG and to stop ASA after 1 month.  Pt now understands he will follow up with Dr. Excell Seltzer in 4 weeks.     Biofreedom drug elunting stents (Biofreedom trial) and will follow up with cardiology research nurses monitoring. In 30 days ok to stop ASA alone pt will continue with Xarelto and Plavix.    We discussed cardiac rehab but he has no insurance so he plans to exercise himself.  2. Acute systolic HF.  Daily wt. He is doing well only mild SOB.  euvolemic today unable to increase meds with borderline BP. He needs Echo in 3 months - this will be arranged on next visit.    3. DVT of Rt lower ext  he  will change xarelto to 20 mg once daily as incstructed this weekend  4. Tobacco use he has stopped.    5. DM has seen PCP  6. Rt hilar mass to follow up with pulmonary   Current medicines are reviewed with the patient today.  The patient Has no concerns regarding medicines.  The following changes have been made:  See above Labs/ tests ordered today include:see above  Disposition:   FU:  see above  Signed, Nada Boozer, NP  10/24/2015 5:56 PM    [facility] [address] [place], [place], [place]  [address]/ [address] [place], [place] Phone: [phone]; Fax: [phone]  [phone]

## 2015-10-24 ENCOUNTER — Telehealth: Payer: Self-pay | Admitting: Cardiology

## 2015-10-24 ENCOUNTER — Encounter: Payer: Self-pay | Admitting: Cardiology

## 2015-10-24 ENCOUNTER — Ambulatory Visit (INDEPENDENT_AMBULATORY_CARE_PROVIDER_SITE_OTHER): Payer: Self-pay | Admitting: Cardiology

## 2015-10-24 VITALS — BP 102/62 | HR 84 | Ht 72.0 in | Wt 200.4 lb

## 2015-10-24 DIAGNOSIS — I429 Cardiomyopathy, unspecified: Secondary | ICD-10-CM

## 2015-10-24 DIAGNOSIS — R918 Other nonspecific abnormal finding of lung field: Secondary | ICD-10-CM

## 2015-10-24 DIAGNOSIS — I2510 Atherosclerotic heart disease of native coronary artery without angina pectoris: Secondary | ICD-10-CM

## 2015-10-24 DIAGNOSIS — R222 Localized swelling, mass and lump, trunk: Secondary | ICD-10-CM

## 2015-10-24 DIAGNOSIS — F17200 Nicotine dependence, unspecified, uncomplicated: Secondary | ICD-10-CM

## 2015-10-24 DIAGNOSIS — Z955 Presence of coronary angioplasty implant and graft: Secondary | ICD-10-CM

## 2015-10-24 DIAGNOSIS — I82401 Acute embolism and thrombosis of unspecified deep veins of right lower extremity: Secondary | ICD-10-CM

## 2015-10-24 NOTE — Patient Instructions (Signed)
Medication Instructions:   Your physician recommends that you continue on your current medications as directed. Please refer to the Current Medication list given to you today.   If you need a refill on your cardiac medications before your next appointment, please call your pharmacy.  Labwork: NONE ORDER TODAY    Testing/Procedures: NONE ORDER TODAY    Follow-Up: IN 4 TO 6 WEEKS WITH DR COOPER    Any Other Special Instructions Will Be Listed Below (If Applicable).

## 2015-10-24 NOTE — Telephone Encounter (Signed)
New message ° ° ° ° °Pt returning nurse call. Please call.  °

## 2015-10-25 ENCOUNTER — Ambulatory Visit (INDEPENDENT_AMBULATORY_CARE_PROVIDER_SITE_OTHER)
Admission: RE | Admit: 2015-10-25 | Discharge: 2015-10-25 | Disposition: A | Discharge status: Home / Self Care | Payer: Self-pay | Source: Ambulatory Visit | Attending: Internal Medicine | Admitting: Internal Medicine

## 2015-10-25 ENCOUNTER — Other Ambulatory Visit (INDEPENDENT_AMBULATORY_CARE_PROVIDER_SITE_OTHER): Payer: Self-pay

## 2015-10-25 ENCOUNTER — Ambulatory Visit (INDEPENDENT_AMBULATORY_CARE_PROVIDER_SITE_OTHER): Payer: Self-pay | Admitting: Internal Medicine

## 2015-10-25 ENCOUNTER — Encounter: Payer: Self-pay | Admitting: Internal Medicine

## 2015-10-25 ENCOUNTER — Inpatient Hospital Stay: Payer: Self-pay | Admitting: Acute Care

## 2015-10-25 VITALS — BP 126/80 | HR 91 | Ht 72.0 in | Wt 201.0 lb

## 2015-10-25 DIAGNOSIS — R0600 Dyspnea, unspecified: Secondary | ICD-10-CM

## 2015-10-25 DIAGNOSIS — I272 Other secondary pulmonary hypertension: Secondary | ICD-10-CM

## 2015-10-25 DIAGNOSIS — I82401 Acute embolism and thrombosis of unspecified deep veins of right lower extremity: Secondary | ICD-10-CM

## 2015-10-25 DIAGNOSIS — R918 Other nonspecific abnormal finding of lung field: Secondary | ICD-10-CM

## 2015-10-25 LAB — CBC WITH DIFFERENTIAL/PLATELET
BASOS ABS: 0.1 10*3/uL (ref 0.0–0.1)
BASOS PCT: 0.5 % (ref 0.0–3.0)
EOS PCT: 5.2 % — AB (ref 0.0–5.0)
Eosinophils Absolute: 0.6 10*3/uL (ref 0.0–0.7)
HCT: 52.1 % — ABNORMAL HIGH (ref 39.0–52.0)
Hemoglobin: 17.6 g/dL — ABNORMAL HIGH (ref 13.0–17.0)
LYMPHS ABS: 2.4 10*3/uL (ref 0.7–4.0)
Lymphocytes Relative: 20.2 % (ref 12.0–46.0)
MCHC: 33.7 g/dL (ref 30.0–36.0)
MCV: 90.8 fl (ref 78.0–100.0)
MONOS PCT: 7.1 % (ref 3.0–12.0)
Monocytes Absolute: 0.9 10*3/uL (ref 0.1–1.0)
NEUTROS ABS: 8.1 10*3/uL — AB (ref 1.4–7.7)
NEUTROS PCT: 67 % (ref 43.0–77.0)
PLATELETS: 278 10*3/uL (ref 150.0–400.0)
RBC: 5.74 Mil/uL (ref 4.22–5.81)
RDW: 14.7 % (ref 11.5–15.5)
WBC: 12.1 10*3/uL — ABNORMAL HIGH (ref 4.0–10.5)

## 2015-10-25 LAB — BASIC METABOLIC PANEL
BUN: 24 mg/dL — AB (ref 6–23)
CALCIUM: 10.3 mg/dL (ref 8.4–10.5)
CO2: 30 mEq/L (ref 19–32)
CREATININE: 0.95 mg/dL (ref 0.40–1.50)
Chloride: 99 mEq/L (ref 96–112)
GFR: 86.31 mL/min (ref >60.00)
Glucose, Bld: 164 mg/dL — ABNORMAL HIGH (ref 70–99)
Potassium: 5.3 mEq/L — ABNORMAL HIGH (ref 3.5–5.1)
Sodium: 137 mEq/L (ref 135–145)

## 2015-10-25 LAB — SEDIMENTATION RATE: Sed Rate: 10 mm/hr (ref 0–20)

## 2015-10-25 LAB — BRAIN NATRIURETIC PEPTIDE: PRO B NATRI PEPTIDE: 544 pg/mL — AB (ref 0.0–100.0)

## 2015-10-25 NOTE — Patient Instructions (Addendum)
Please remember to go to the lab and x-ray department downstairs for your tests - we will call you with the results when they are available.  Please schedule a follow up office visit in 4 weeks, sooner if needed       

## 2015-10-25 NOTE — Progress Notes (Signed)
Spoke with pt and notified of results per Dr. Wert. Pt verbalized understanding and denied any questions. 

## 2015-10-25 NOTE — Progress Notes (Signed)
Subjective:     Patient ID: Robert Hampton, male   DOB: 03-Mar-1956,     MRN: [id number]  HPI  12 yowm quit smoking August 31 2015 s/p admit:  Date of Admission: 09/27/2015                                          Date of Discharge: 10/07/2015 Admitting Physician: Tobey Grim, MD  Primary Care Provider: No PCP Per Patient Consultants: Pulmonology, cariodiology  Indication for Hospitalization: dyspnea and malaise  Discharge Diagnoses/Problem List:  Acute respiratory failure with hypoxia (HCC)   Uncontrolled type 2 DM- on oral agent    Acute systolic CHF    Anasarca   Elevated liver enzymes   CAP (community acquired pneumonia)-LUL   Cardiomyopathy- etiology not yet determined- appears to be NICM   ETOH abuse-daily drinker   Smoker   DVT-Rt lower extremity (HCC)   Pulmonary hypertension (HCC)  =  20/4.86 = 4.11 [place] units in setting of ? Acute PE   Abnormal CT scan of lung   Abnormal nuclear stress test   CHF (congestive heart failure) (HCC)   Coronary artery disease due to lipid rich plaque   LV dysfunction   S/P drug eluting coronary stent placement 10/06/15    10/25/2015 1st [facility] office visit/ Robert Hampton  On xarelto/ ACEi no resp rx  Chief Complaint  Patient presents with  . Hospitalization Follow-up    Pt states his breathing has imrpoved, but not back to his normal baseline yet. He feels SOB when he lies down flat. He has occ cough- non prod.    fell thru conveyer belt 25 y pta surgery required including pins and since then R leg swelling/painful learned to live with it on no medicines other than advil then more swollen x 2 weeks prior to admit with onset then fatigue / cough dry with bilateral flank R side worse mostly flank assoc with orthopnea rx uc with abx and generalized swelling and sweats /coughed to point of vomiting with essentially nl cxr 09/19/15 > admit 09/27/15   Cough less/ less cp / still fatigued  When perfectly flat notes cough/sob   No  obvious day to day or daytime variability or assoc excess/ purulent sputum or mucus plugs or hemoptysis or cp or chest tightness, subjective wheeze or overt sinus or hb symptoms. No unusual exp hx or h/o childhood pna/ asthma or knowledge of premature birth.  Sleeping ok without nocturnal  or early am exacerbation  of respiratory  c/o's or need for noct saba. Also denies any obvious fluctuation of symptoms with weather or environmental changes or other aggravating or alleviating factors except as outlined above   Current Medications, Allergies, Complete Past Medical History, Past Surgical History, Family History, and Social History were reviewed in [name] record.  ROS  The following are not active complaints unless bolded sore throat, dysphagia, dental problems, itching, sneezing,  nasal congestion or excess/ purulent secretions, ear ache,   fever, chills, sweats, unintended wt loss, classically pleuritic or exertional cp,  orthopnea pnd or leg swelling, presyncope, palpitations, abdominal pain, anorexia, nausea, vomiting, diarrhea  or change in bowel or bladder habits, change in stools or urine, dysuria,hematuria,  rash, arthralgias, visual complaints, headache, numbness, weakness or ataxia or problems with walking or coordination,  change in mood/affect or memory.  Review of Systems     Objective:   Physical Exam amb wm nad   Wt Readings from Last 3 Encounters:  10/25/15 201 lb (91.2 kg)  10/24/15 200 lb 6.4 oz (90.9 kg)  10/16/15 199 lb (90.3 kg)    Vital signs reviewed   HEENT: nl dentition, turbinates, and oropharynx. Nl external ear canals without cough reflex   NECK :  without JVD/Nodes/TM/ nl carotid upstrokes bilaterally   LUNGS: no acc muscle use,  Nl contour chest which is clear to A and P bilaterally without cough on insp or exp maneuvers   CV:  RRR  no s3 or murmur or increase in P2, no edema   ABD:  soft and nontender  with nl inspiratory excursion in the supine position. No bruits or organomegaly, bowel sounds nl  MS:  Nl gait/ ext warm without deformities, calf tenderness, cyanosis or clubbing No obvious joint restrictions - RLE with bilateral surgical scars over calf laterally and medially   SKIN: warm and dry without lesions    NEURO:  alert, approp, nl sensorium with  no motor deficits    CXR PA and Lateral:   10/25/2015 :    I personally reviewed images and agree with radiology impression as follows:    Interval clearing of right lower lobe pneumonia and pleural effusion. Persistent abnormal left apical alveolar opacity worrisome for residual pneumonia or underlying mass   Labs ordered/ reviewed:      Chemistry      Component Value Date/Time   NA 137 10/25/2015 1123   K 5.3 (H) 10/25/2015 1123   CL 99 10/25/2015 1123   CO2 30 10/25/2015 1123   BUN 24 (H) 10/25/2015 1123   CREATININE 0.95 10/25/2015 1123   CREATININE 0.83 09/19/2015 1544      Component Value Date/Time   CALCIUM 10.3 10/25/2015 1123   ALKPHOS 88 10/07/2015 0548   AST 22 10/07/2015 0548   ALT 44 10/07/2015 0548   BILITOT 1.2 10/07/2015 0548        Lab Results  Component Value Date   WBC 12.1 (H) 10/25/2015   HGB 17.6 (H) 10/25/2015   HCT 52.1 (H) 10/25/2015   MCV 90.8 10/25/2015   PLT 278.0 10/25/2015        Lab Results  Component Value Date   TSH 2.456 09/28/2015     Lab Results  Component Value Date   PROBNP 544.0 (H) 10/25/2015       Lab Results  Component Value Date   ESRSEDRATE 10 10/25/2015           Assessment:

## 2015-10-31 ENCOUNTER — Encounter: Payer: Self-pay | Admitting: Internal Medicine

## 2015-10-31 NOTE — Assessment & Plan Note (Signed)
08/2015 CTA chest showed possible hilar mass on CTA chest imaging;   - cxr 10/25/2015 marked improvement > f/u 4 weeks rec   Discussed in detail all the  indications, usual  risks and alternatives  relative to the benefits with patient who agrees to proceed with conservative f/u as not feasible to consider any kind of bx at this point and his problems are clinically and radiographically all improving.

## 2015-10-31 NOTE — Assessment & Plan Note (Signed)
RHC 10/04/15   PA mean = 41 - PCWP 21 = 20 /4.86 CO = 4.11   So he has elevation of PA and PCWP in setting of acute illness c/w PNA and clearly does not meet criteria for PAH so no change rx needed for now

## 2015-10-31 NOTE — Assessment & Plan Note (Signed)
Improved except mild orthopnea with elevated lvedp at R[facility] and bnp > 500 so likely still has component of chf / can't exclude acei effects but for now reasonable to continue it as overall trend is improving.   I had an extended discussion with the patient reviewing all relevant studies completed to date including extensive inpt records  and  lasting 25 minutes of a 40  minute transition of care office visit    Each maintenance medication was reviewed in detail including most importantly the difference between maintenance and prns and under what circumstances the prns are to be triggered using an action plan format that is not reflected in the computer generated alphabetically organized AVS.    Please see instructions for details which were reviewed in writing and the patient given a copy highlighting the part that I personally wrote and discussed at today's ov.

## 2015-10-31 NOTE — Assessment & Plan Note (Addendum)
Dx 09/28/15 in setting of remote injury and chronic swelling > rx xarelto   Discussed in detail all the  indications, usual  risks and alternatives  relative to the benefits with patient who agrees to proceed with xarelto long term

## 2015-11-08 ENCOUNTER — Encounter: Payer: Self-pay | Admitting: *Deleted

## 2015-11-08 DIAGNOSIS — Z006 Encounter for examination for normal comparison and control in clinical research program: Secondary | ICD-10-CM

## 2015-11-08 NOTE — Progress Notes (Signed)
LEADERS FREE II research study month 1 visit completed. Patient states he stopped ASA on 11/06/15 as directed at discharge. He has been angina free and denies any adverse events. EKG obtained, reviewed concomitant medications. Next Research required follow up will be a telephone call next month and then office visit at month 6. Questions encouraged and answered.

## 2015-11-09 ENCOUNTER — Telehealth: Payer: Self-pay | Admitting: Cardiology

## 2015-11-09 NOTE — Telephone Encounter (Signed)
New message  Pt verbalized that he was told that he will receive a call in 30 days from 10/24/15 to schedule and appt with Dr.cooper, pt is requesting an appt on Monday, Tuesday or Wednesday and not on the 14,17 & 28 of Aug  Rt also verbalized that his right arm is numb, NO CHEST PAIN, NO SOB, NO other issues

## 2015-11-09 NOTE — Telephone Encounter (Signed)
Calling stating that he was told at last OV on 7/25 with Lady SaucierLaura Ingold,NP that he should see Dr. Excell Seltzerooper the end of August. Advised that Dr. Excell Seltzerooper not in office this week or next week.  Advised he does not have any open appointments the remaining days of August.  He is requesting not a Mon, Tues or Wed and not 14, 17 or 28.  Advised would forward to his nurse, Nelly RoutLaura Brown, RN to see if she can find an available appointment.  No CP or SOB.  States he has been having some numbness in his (R) arm but also states he has had tingling in his fingers for "awhile".

## 2015-11-17 ENCOUNTER — Ambulatory Visit: Payer: Self-pay | Admitting: Family Medicine

## 2015-11-20 ENCOUNTER — Encounter: Payer: Self-pay | Admitting: Cardiovascular Disease

## 2015-11-20 NOTE — Telephone Encounter (Signed)
Appointment has been scheduled for 11/21/2015 with Dr Excell Seltzerooper.

## 2015-11-20 NOTE — Telephone Encounter (Signed)
Left message on machine for pt to contact the office and arrange ROV this week with Dr Excell Seltzerooper.

## 2015-11-21 ENCOUNTER — Encounter (INDEPENDENT_AMBULATORY_CARE_PROVIDER_SITE_OTHER): Payer: Self-pay

## 2015-11-21 ENCOUNTER — Encounter: Payer: Self-pay | Admitting: Cardiovascular Disease

## 2015-11-21 ENCOUNTER — Ambulatory Visit (INDEPENDENT_AMBULATORY_CARE_PROVIDER_SITE_OTHER): Payer: Self-pay | Admitting: Cardiovascular Disease

## 2015-11-21 VITALS — BP 104/60 | HR 87 | Ht 72.0 in | Wt 206.8 lb

## 2015-11-21 DIAGNOSIS — I5022 Chronic systolic (congestive) heart failure: Secondary | ICD-10-CM

## 2015-11-21 NOTE — Progress Notes (Signed)
Cardiology Office Note Date:  11/22/2015   ID:  Robert Hampton, DOB 12/31/1955, MRN [id number]  PCP:  Leland HerElsia J Yoo, DO  Cardiologist:  Tonny Bollmanooper, Sugar Vanzandt, MD    Chief Complaint  Patient presents with  . Coronary Artery Disease    due to lipid rich plaque     History of Present Illness: Robert Hampton is a 60 y.o. male who presents for follow-up evaluation. He had a complicated hospitalization in July 2017 with respiratory failure and acute heart failure. In the setting of heart failure with LVEF 35% he was found to have pulmonary hypertension. He was found to have 3 vessel CAD at cath, underwent surgical evaluation and was not a good immediate surgical candidate in the context of his acute medical illness, and ultimately underwent 3 vessel PCI as part of the [employer] Trial with [employer] drug-eluting stents. He was diagnosed with acute DVT in the leg and has been anticoagulated with Xarelto. He was also found to have pneumonia and repeat CT has been recommended. He has followed up with Dr Sherene SiresWert in the Pulmonary [facility].   He is doing relatively well. Initially saw Nada BoozerLaura Ingold, NP in follow-up on July 25th. Complains of left hand and arm numbness and weakness, otherwise no complaints. No recurrent chest pain. Breathing is improved. Right leg swelling is present and this is longstanding. No orthopnea or PND.    Past Medical History:  Diagnosis Date  . Abnormal CT scan of lung 08/2015  . Acute respiratory failure with hypoxia (HCC) 09/2015  . Acute systolic HF (heart failure) (HCC) 09/2015  . Coronary artery disease   . Diabetes mellitus without complication (HCC)   . Diverticulosis    shown on CT  . DVT (deep venous thrombosis) (HCC) 10/06/2015   RT LEG    Past Surgical History:  Procedure Laterality Date  . CARDIAC CATHETERIZATION N/A 10/04/2015   Procedure: Right/Left Heart Cath and Coronary Angiography;  Surgeon: Tonny BollmanMichael Karmen Altamirano, MD;  [facility];  Service:  Cardiovascular;  Laterality: N/A;  . CARDIAC CATHETERIZATION N/A 10/06/2015   Procedure: Coronary Stent Intervention;  Surgeon: Tonny BollmanMichael Letia Guidry, MD;  [facility];  Service: Cardiovascular;  Laterality: N/A;  . CORONARY STENT PLACEMENT  10/06/2015   Successful 3 vessel PCI using drug-eluting stents in the mid-RCA, mid-circumflex, and mid-LAD  . KNEE SURGERY      Current Outpatient Prescriptions  Medication Sig Dispense Refill  . carvedilol (COREG) 6.25 MG tablet Take 1 tablet (6.25 mg total) by mouth 2 (two) times daily with a meal. 60 tablet 1  . clopidogrel (PLAVIX) 75 MG tablet Take 1 tablet (75 mg total) by mouth daily with breakfast. 30 tablet 1  . folic acid (FOLVITE) 1 MG tablet Take 1 tablet (1 mg total) by mouth daily. 30 tablet 5  . furosemide (LASIX) 40 MG tablet Take 1 tablet (40 mg total) by mouth daily. 30 tablet 1  . glipiZIDE (GLUCOTROL) 5 MG tablet Take 1 tablet (5 mg total) by mouth 2 (two) times daily before a meal. 60 tablet 3  . lisinopril (PRINIVIL,ZESTRIL) 5 MG tablet Take 1 tablet (5 mg total) by mouth daily. 30 tablet 1  . metFORMIN (GLUCOPHAGE) 500 MG tablet Take 2 tablets (1,000 mg total) by mouth 2 (two) times daily with a meal. 120 tablet 1  . Multiple Vitamin (MULTIVITAMIN WITH MINERALS) TABS tablet Take 1 tablet by mouth daily. 30 tablet 1  . rivaroxaban (XARELTO) 20 MG TABS tablet Take 20 mg  by mouth daily with supper.    Marland Kitchen spironolactone (ALDACTONE) 25 MG tablet Take 0.5 tablets (12.5 mg total) by mouth daily. 15 tablet 1  . thiamine 100 MG tablet Take 1 tablet (100 mg total) by mouth daily. 30 tablet 5   No current facility-administered medications for this visit.     Allergies:   Bee venom   Social History:  The patient  reports that he quit smoking about 2 months ago. His smoking use included Cigarettes. He quit after 40.00 years of use. He has never used smokeless tobacco. He reports that he drinks alcohol. He reports that he does not use  drugs.   Family History:  The patient's  family history includes Heart attack in his mother; Hypertension in his mother.    ROS:  Please see the history of present illness.  Otherwise, review of systems is positive for leg swelling, easy bruising, excessive fatigue, leg pain.  All other systems are reviewed and negative.    PHYSICAL EXAM: VS:  BP 104/60   Pulse 87   Ht 6' (1.829 m)   Wt 93.8 kg (206 lb 12.8 oz)   BMI 28.05 kg/m  , BMI Body mass index is 28.05 kg/m. GEN: Well nourished, well developed, in no acute distress  HEENT: normal  Neck: no JVD, no masses. No carotid bruits Cardiac: RRR without murmur or gallop                Respiratory:  clear to auscultation bilaterally, normal work of breathing GI: soft, nontender, nondistended, + BS MS: no deformity or atrophy  Ext: 1+ right leg edema The right radial pulse is absent. Ulnar pulse is 2+, pulse ox shows a good signal in the right thumb, signal loss with ulnar occlusion. Left radial pulse is 2+ Skin: warm and dry, no rash Neuro:  Strength and sensation are intact Psych: euthymic mood, full affect  EKG:  EKG is not ordered today.  Recent Labs: 09/28/2015: TSH 2.456 09/30/2015: B Natriuretic Peptide 1,061.5 10/07/2015: ALT 44; Magnesium 1.8 10/25/2015: BUN 24; Creatinine, Ser 0.95; Hemoglobin 17.6; Platelets 278.0; Potassium 5.3; Pro B Natriuretic peptide (BNP) 544.0; Sodium 137   Lipid Panel  No results found for: CHOL, TRIG, HDL, CHOLHDL, VLDL, LDLCALC, LDLDIRECT    Wt Readings from Last 3 Encounters:  11/21/15 93.8 kg (206 lb 12.8 oz)  10/25/15 91.2 kg (201 lb)  10/24/15 90.9 kg (200 lb 6.4 oz)     Cardiac Studies Reviewed: 2D Echo 09-28-2015: Study Conclusions  - Left ventricle: The cavity size was mildly dilated. Wall   thickness was normal. Systolic function was moderately to   severely reduced. The estimated ejection fraction was in the   range of 30% to 35%. Diffuse hypokinesis. The study is not    technically sufficient to allow evaluation of LV diastolic   function. - Mitral valve: Mildly thickened leaflets . There was mild   regurgitation. - Left atrium: The atrium was normal in size. - Right ventricle: Poorly visualized. - Right atrium: The atrium was normal in size. - Tricuspid valve: There was moderate regurgitation. - Pulmonary arteries: PA peak pressure: 47 mm Hg (S). - Inferior vena cava: The vessel was dilated. The respirophasic   diameter changes were blunted (< 50%), consistent with elevated   central venous pressure. - Pericardium, extracardiac: There was no pericardial effusion.  Impressions:  - LVEF 30-35%, mildly dilated with global hypokinesis, mild MR,   normal biatrial size, moderate TR, RVSP 47 mmHg, dilated  IVC, no   pericardial effusion.  Cardiac Cath/PCI 10/06/2015: Conclusion    Ost 2nd Diag to 2nd Diag lesion, 100% stenosed.  Ost RPDA to RPDA lesion, 75% stenosed.  Prox Cx lesion, 50% stenosed.  Mid LAD lesion, 80% stenosed. Post intervention, there is a 0% residual stenosis.  Prox Cx to Mid Cx lesion, 80% stenosed. Post intervention, there is a 0% residual stenosis.  Prox RCA to Mid RCA lesion, 90% stenosed. Post intervention, there is a 0% residual stenosis.   Successful 3 vessel PCI using drug-eluting stents in the mid-RCA, mid-circumflex, and mid-LAD Residual occlusion with collateral filling of the distal circumflex and second diagonal (both small branches)  Recommend: Pt can start on oral anticoagulation tonight as long as radial site is clear. Would treat with ASA 81 mg and plavix 75 mg x 1 month, then DC ASA. Continue clopidogrel and oral anticoagulation after ASA discontinued.  Total contrast = 105 cc   ASSESSMENT AND PLAN: 1.  Right leg DVT - ultrasound reviewed and extensive DVT noted. On Xarelto - cost is an issue. Samples given today. Duration of anticoagulation needs to be determined. Follow-up ultrasound after 3 months of  therapy may be helpful.  2. CAD, native vessel: no anginal symptoms after multivessel stenting. Seems to be progressing well. Continue plavix alone in setting of chronic anticoagulation.   3. 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 Normal signal in right thumb, but reverse Allen's test is abnormal confirming occlusion of the radial artery. Follow for clinical improvement which would be expected over the next several weeks.   4. Chronic systolic heart failure: NYHA IIB. Continue same Rx. Medications reviewed. Follow-up echo would be beneficial but cost is a major issue and I'm not sure it would change management. Will FU in 6 weeks and discuss further.   Current medicines are reviewed with the patient today.  The patient does not have concerns regarding medicines.  Labs/ tests ordered today include:  No orders of the defined types were placed in this encounter.   Disposition:   FU 6 weeks  Signed, Tonny Bollmanooper, Roniyah Llorens, MD  11/22/2015 10:31 PM    [facility] [address], [place], [place]  [address] Phone: [phone]; Fax: [phone]

## 2015-11-21 NOTE — Patient Instructions (Signed)
Medication Instructions:  Your physician recommends that you continue on your current medications as directed. Please refer to the Current Medication list given to you today.  Labwork: No new orders.   Testing/Procedures: No new orders.   Follow-Up: Your physician recommends that you schedule a follow-up appointment in: 6 WEEKS with Dr Excell Seltzerooper   Any Other Special Instructions Will Be Listed Below (If Applicable).  Order given to the patient for knee high compression stockings (20-30 mmHg)     If you need a refill on your cardiac medications before your next appointment, please call your pharmacy.

## 2015-11-22 ENCOUNTER — Other Ambulatory Visit: Payer: Self-pay | Admitting: *Deleted

## 2015-11-23 ENCOUNTER — Other Ambulatory Visit: Payer: Self-pay | Admitting: Family Medicine

## 2015-11-27 ENCOUNTER — Ambulatory Visit (INDEPENDENT_AMBULATORY_CARE_PROVIDER_SITE_OTHER)
Admission: RE | Admit: 2015-11-27 | Discharge: 2015-11-27 | Disposition: A | Discharge status: Home / Self Care | Payer: Self-pay | Source: Ambulatory Visit | Attending: Internal Medicine | Admitting: Internal Medicine

## 2015-11-27 ENCOUNTER — Other Ambulatory Visit (INDEPENDENT_AMBULATORY_CARE_PROVIDER_SITE_OTHER): Payer: Self-pay

## 2015-11-27 ENCOUNTER — Encounter: Payer: Self-pay | Admitting: Internal Medicine

## 2015-11-27 ENCOUNTER — Ambulatory Visit (INDEPENDENT_AMBULATORY_CARE_PROVIDER_SITE_OTHER): Payer: Self-pay | Admitting: Internal Medicine

## 2015-11-27 VITALS — BP 142/72 | HR 80 | Ht 73.0 in | Wt 206.6 lb

## 2015-11-27 DIAGNOSIS — R0600 Dyspnea, unspecified: Secondary | ICD-10-CM

## 2015-11-27 DIAGNOSIS — R918 Other nonspecific abnormal finding of lung field: Secondary | ICD-10-CM

## 2015-11-27 LAB — BASIC METABOLIC PANEL
BUN: 20 mg/dL (ref 6–23)
CALCIUM: 9.8 mg/dL (ref 8.4–10.5)
CHLORIDE: 99 meq/L (ref 96–112)
CO2: 30 meq/L (ref 19–32)
Creatinine, Ser: 0.74 mg/dL (ref 0.40–1.50)
GFR: 115.12 mL/min (ref >60.00)
GLUCOSE: 200 mg/dL — AB (ref 70–99)
POTASSIUM: 4.6 meq/L (ref 3.5–5.1)
Sodium: 134 mEq/L — ABNORMAL LOW (ref 135–145)

## 2015-11-27 LAB — CBC WITH DIFFERENTIAL/PLATELET
BASOS ABS: 0.1 10*3/uL (ref 0.0–0.1)
Basophils Relative: 0.5 % (ref 0.0–3.0)
EOS ABS: 0.4 10*3/uL (ref 0.0–0.7)
Eosinophils Relative: 3.1 % (ref 0.0–5.0)
HEMATOCRIT: 46.8 % (ref 39.0–52.0)
HEMOGLOBIN: 16.1 g/dL (ref 13.0–17.0)
LYMPHS PCT: 22.3 % (ref 12.0–46.0)
Lymphs Abs: 3 10*3/uL (ref 0.7–4.0)
MCHC: 34.5 g/dL (ref 30.0–36.0)
MCV: 88.6 fl (ref 78.0–100.0)
Monocytes Absolute: 1.1 10*3/uL — ABNORMAL HIGH (ref 0.1–1.0)
Monocytes Relative: 8.3 % (ref 3.0–12.0)
Neutro Abs: 8.9 10*3/uL — ABNORMAL HIGH (ref 1.4–7.7)
Neutrophils Relative %: 65.8 % (ref 43.0–77.0)
Platelets: 289 10*3/uL (ref 150.0–400.0)
RBC: 5.28 Mil/uL (ref 4.22–5.81)
RDW: 15.3 % (ref 11.5–15.5)
WBC: 13.5 10*3/uL — AB (ref 4.0–10.5)

## 2015-11-27 LAB — BRAIN NATRIURETIC PEPTIDE: PRO B NATRI PEPTIDE: 128 pg/mL — AB (ref 0.0–100.0)

## 2015-11-27 LAB — TSH: TSH: 1.35 u[IU]/mL (ref 0.35–4.50)

## 2015-11-27 NOTE — Patient Instructions (Signed)
Please remember to go to the lab and x-ray department downstairs for your tests - we will call you with the results when they are available.     Please schedule a follow up office visit in 6 weeks, call sooner if needed

## 2015-11-27 NOTE — Progress Notes (Signed)
Subjective:     Patient ID: Robert Hampton, male   DOB: Feb 02, 1956,     MRN: [id number]     89 yowm quit smoking August 31 2015 s/p admit:  Date of Admission: 09/27/2015                                          Date of Discharge: 10/07/2015    Primary Care Provider: No PCP Per Patient Consultants: Pulmonology, cariodiology   Discharge Diagnoses/Problem List:  Acute respiratory failure with hypoxia (HCC)   Uncontrolled type 2 DM- on oral agent    Acute systolic CHF    Anasarca   Elevated liver enzymes   CAP (community acquired pneumonia)-LUL   Cardiomyopathy- etiology not yet determined- appears to be NICM   ETOH abuse-daily drinker   Smoker   DVT-Rt lower extremity (HCC)   Pulmonary hypertension (HCC)  =  20/4.86 = 4.11 Woods units in setting of ? Acute PE   Abnormal CT scan of lung   Abnormal nuclear stress test   CHF (congestive heart failure) (HCC)   Coronary artery disease due to lipid rich plaque   LV dysfunction   S/P drug eluting coronary stent placement 10/06/15    10/25/2015 1st [facility] office visit/ Lametria Klunk  On xarelto/ ACEi no resp rx  Chief Complaint  Patient presents with  . Hospitalization Follow-up    Pt states his breathing has imrpoved, but not back to his normal baseline yet. He feels SOB when he lies down flat. He has occ cough- non prod.    fell thru conveyer belt 26 y pta surgery required including pins and since then R leg swelling/painful learned to live with it on no medicines other than advil then more swollen x 2 weeks prior to admit with onset then fatigue / cough dry with bilateral flank R side worse mostly flank assoc with orthopnea rx uc with abx and generalized swelling and sweats /coughed to point of vomiting with essentially nl cxr 09/19/15 > admit 09/27/15  Cough less/ less cp / still fatigued  When perfectly flat notes cough/sob  rec Please remember to go to the lab and x-ray department downstairs for your tests - we will call you  with the results when they are available.    11/27/2015  f/u ov/Maryum Batterson re:  F/u LUL pna  Chief Complaint  Patient presents with  . Follow-up    Breathing has improved slightly. No new co's today.    can walk a couple of miles a day including [place] @  nl pace really more limited  by leg pain / fatigue > breathing and no longer coughing at all   No obvious day to day or daytime variability or assoc excess/ purulent sputum or mucus plugs or hemoptysis or cp or chest tightness, subjective wheeze or overt sinus or hb symptoms. No unusual exp hx or h/o childhood pna/ asthma or knowledge of premature birth.  Sleeping ok without nocturnal  or early am exacerbation  of respiratory  c/o's or need for noct saba. Also denies any obvious fluctuation of symptoms with weather or environmental changes or other aggravating or alleviating factors except as outlined above   Current Medications, Allergies, Complete Past Medical History, Past Surgical History, Family History, and Social History were reviewed in [facility] electronic medical record.  ROS  The following are not active complaints unless bolded sore  throat, dysphagia, dental problems, itching, sneezing,  nasal congestion or excess/ purulent secretions, ear ache,   fever, chills, sweats, unintended wt loss, classically pleuritic or exertional cp,  orthopnea pnd or leg swelling, presyncope, palpitations, abdominal pain, anorexia, nausea, vomiting, diarrhea  or change in bowel or bladder habits, change in stools or urine, dysuria,hematuria,  rash, arthralgias, visual complaints, headache, numbness, weakness or ataxia or problems with walking or coordination,  change in mood/affect or memory.           Objective:   Physical Exam  amb wm nad    11/27/2015       207   10/25/15 201 lb (91.2 kg)  10/24/15 200 lb 6.4 oz (90.9 kg)  10/16/15 199 lb (90.3 kg)    Vital signs reviewed   HEENT: nl dentition, turbinates, and oropharynx. Nl external  ear canals without cough reflex   NECK :  without JVD/Nodes/TM/ nl carotid upstrokes bilaterally   LUNGS: no acc muscle use,  Nl contour chest which is clear to A and P bilaterally without cough on insp or exp maneuvers   CV:  RRR  no s3 or murmur or increase in P2, no edema   ABD:  soft and nontender with nl inspiratory excursion in the supine position. No bruits or organomegaly, bowel sounds nl  MS:  Nl gait/ ext warm without deformities, calf tenderness, cyanosis or clubbing No obvious joint restrictions   SKIN: warm and dry without lesions    NEURO:  alert, approp, nl sensorium with  no motor deficits    CXR PA and Lateral:   11/27/2015 :    I personally reviewed images and agree with radiology impression as follows:   Incomplete clearing of left upper lobe infiltrate during the 30 days since the previous study.   My impression:  Marked serial improvement vs 09/27/15 with nl baseline 09/19/15          Labs ordered/ reviewed:      Chemistry      Component Value Date/Time   NA 134 (L) 11/27/2015 1129   K 4.6 11/27/2015 1129   CL 99 11/27/2015 1129   CO2 30 11/27/2015 1129   BUN 20 11/27/2015 1129   CREATININE 0.74 11/27/2015 1129   CREATININE 0.83 09/19/2015 1544      Component Value Date/Time   CALCIUM 9.8 11/27/2015 1129   ALKPHOS 88 10/07/2015 0548   AST 22 10/07/2015 0548   ALT 44 10/07/2015 0548   BILITOT 1.2 10/07/2015 0548        Lab Results  Component Value Date   WBC 13.5 (H) 11/27/2015   HGB 16.1 11/27/2015   HCT 46.8 11/27/2015   MCV 88.6 11/27/2015   PLT 289.0 11/27/2015      Lab Results  Component Value Date   TSH 1.35 11/27/2015     Lab Results  Component Value Date   PROBNP 128.0 (H) 11/27/2015       Lab Results  Component Value Date   ESRSEDRATE 10 10/25/2015               Assessment:

## 2015-11-28 ENCOUNTER — Telehealth: Payer: Self-pay | Admitting: Internal Medicine

## 2015-11-28 NOTE — Telephone Encounter (Signed)
Pt calling about some results and wanting to know if they can be e-mailed to him his e-mail is [email or web address] says they've been playing phone tag?Caren Griffins.Stanley A Dalton

## 2015-11-28 NOTE — Assessment & Plan Note (Signed)
Marked serial improvement in ex tol/ all labs have normalized > no further w/u anticipated/ f/u cards and PC planned

## 2015-11-28 NOTE — Progress Notes (Signed)
LMTCB

## 2015-11-28 NOTE — Assessment & Plan Note (Signed)
08/2015 CTA chest showed possible hilar mass on CTA chest imaging;   - cxr 10/25/2015 marked improvement > f/u 11/27/2015 improved but not completely resolved > f/u in 6 weeks rec   Discussed in detail all the  indications, usual  risks and alternatives  relative to the benefits with patient who agrees to proceed with conservative f/u as outlined  (on anticogulation for dvt/pe so not a good candidate for fob unless really needs it and given serial improvement in cxr and all symptoms do not feel the urge for invasive w/u   I had an extended discussion with the patient reviewing all relevant studies completed to date and  lasting 15 to 20 minutes of a 25 minute visit    Each maintenance medication was reviewed in detail including most importantly the difference between maintenance and prns and under what circumstances the prns are to be triggered using an action plan format that is not reflected in the computer generated alphabetically organized AVS.    Please see instructions for details which were reviewed in writing and the patient given a copy highlighting the part that I personally wrote and discussed at today's ov.

## 2015-11-28 NOTE — Telephone Encounter (Signed)
Pt returning call again.Robert Hampton ° °

## 2015-11-28 NOTE — Telephone Encounter (Signed)
Spoke with pt and advised of lab and cxr results per Dr Sherene SiresWert.

## 2015-12-02 ENCOUNTER — Other Ambulatory Visit: Payer: Self-pay | Admitting: Family Medicine

## 2015-12-03 ENCOUNTER — Other Ambulatory Visit: Payer: Self-pay | Admitting: Family Medicine

## 2015-12-05 ENCOUNTER — Other Ambulatory Visit: Payer: Self-pay | Admitting: Family Medicine

## 2015-12-09 ENCOUNTER — Other Ambulatory Visit: Payer: Self-pay | Admitting: Family Medicine

## 2015-12-09 DIAGNOSIS — E1110 Type 2 diabetes mellitus with ketoacidosis without coma: Secondary | ICD-10-CM

## 2015-12-12 ENCOUNTER — Telehealth: Payer: Self-pay | Admitting: *Deleted

## 2015-12-12 NOTE — Telephone Encounter (Signed)
Left message for patient to call research office to complete 2 month telephone follow up visit.

## 2015-12-14 ENCOUNTER — Telehealth: Payer: Self-pay | Admitting: *Deleted

## 2015-12-14 NOTE — Telephone Encounter (Signed)
Left message for patient to call Research office for 2 month telephone [facility] II telephone follow up.

## 2015-12-15 ENCOUNTER — Encounter: Payer: Self-pay | Admitting: *Deleted

## 2015-12-15 DIAGNOSIS — Z006 Encounter for examination for normal comparison and control in clinical research program: Secondary | ICD-10-CM

## 2015-12-19 NOTE — Progress Notes (Signed)
[facility] II month 2 visit completed. Patient denies any changes in his health and has been angina free.

## 2015-12-27 ENCOUNTER — Ambulatory Visit (INDEPENDENT_AMBULATORY_CARE_PROVIDER_SITE_OTHER): Payer: Self-pay | Admitting: Family Medicine

## 2015-12-27 ENCOUNTER — Encounter: Payer: Self-pay | Admitting: Family Medicine

## 2015-12-27 VITALS — BP 111/68 | HR 94 | Temp 97.8°F | Wt 205.0 lb

## 2015-12-27 DIAGNOSIS — E1110 Type 2 diabetes mellitus with ketoacidosis without coma: Secondary | ICD-10-CM

## 2015-12-27 DIAGNOSIS — E1310 Other specified diabetes mellitus with ketoacidosis without coma: Secondary | ICD-10-CM

## 2015-12-27 DIAGNOSIS — Z23 Encounter for immunization: Secondary | ICD-10-CM

## 2015-12-27 DIAGNOSIS — I82401 Acute embolism and thrombosis of unspecified deep veins of right lower extremity: Secondary | ICD-10-CM

## 2015-12-27 LAB — POCT GLYCOSYLATED HEMOGLOBIN (HGB A1C): Hemoglobin A1C: 7.1

## 2015-12-27 NOTE — Patient Instructions (Signed)
Thank you for coming in for your diabetes follow-up today.  - Your morning fasting sugars are looking under good control and your Hgb a1c was improved at 7.1 today.  - Please continue the good progress you have made with your diet and exercise.  - Please have an eye exam for your diabetes. - I know you are thinking about quitting smoking again and would like to encourage you in your efforts!   Thank you for getting the flu shot, the tetanus booster and the pneumovax immunizations today.  We will get an ultrasound of your R leg to see if they close has resolved, if it has resolved it would be safe to have it massaged but please wait until the results are back.

## 2015-12-27 NOTE — Assessment & Plan Note (Signed)
Continue metformin and glipizide, a1c improved at 7.1 from 10.4. Patient will check blood sugars during afternoon cold sweats episodes.  Referral placed to Optho for diabetic foot exam. Encouraged patient on smoking cessation.

## 2015-12-27 NOTE — Progress Notes (Signed)
    Subjective:  Robert Hampton is a 60 y.o. male who presents to [facility] today for a diabetes follow up.  HPI: States has been doing well and brought his log today showing morning fasting readings ranging from 140-90s, averaging in 110-120s.  Is tolerating metformin well and is compliant with his glipizide and lisinopril. Has been eating a healthy low carb diet. Has had an episode of cold sweats and feeling weak in the afternoons for the past 2-3 days, after eating lunch, lasting few minutes. No n/v, fever/chills, cough, congestion, LOC. Attributes to poor hydration and plans to drink more water. Is willing to check blood sugars during episodes. Recently started smoking again, currently smokes 10cig per week. Plans to quit again but has not yet set a quit date. States is his form of stress relief but thinks that massage is a better form of stress relief. Voices concern that with R LE DVT, if massage would even be possible.  ROS: Per HPI  Objective:  Physical Exam: BP 111/68   Pulse 94   Temp 97.8 F (36.6 C) (Oral)   Wt 205 lb (93 kg)   SpO2 94%   BMI 27.05 kg/m   Gen: NAD, resting comfortably CV: RRR with no murmurs appreciated Pulm: NWOB, CTAB with no crackles, wheezes, or rhonchi MSK: no edema, cyanosis, or clubbing noted. Diabetic foot exam: sensation intact with monofilament to at least 5 locations b/l Skin: warm, dry Neuro: grossly normal, moves all extremities Psych: Normal affect and thought content  Results for orders placed or performed in visit on 12/27/15 (from the past 72 hour(s))  HgB A1c     Status: None   Collection Time: 12/27/15  1:34 PM  Result Value Ref Range   Hemoglobin A1C 7.1      Assessment/Plan:  Uncontrolled type 2 DM- on oral agent  Continue metformin and glipizide, a1c improved at 7.1 from 10.4. Patient will check blood sugars during afternoon cold sweats episodes.  Referral placed to Optho for diabetic foot exam. Encouraged patient on  smoking cessation.  R LE DVT On xarelto, following with cardiology - US of R LE to check for resolution of R DVT, if resolved will need to determine duration of anticoagulation.  Health maintenance - flu, TDAP, pneumovax given today - Patient signed records release for [facility] for colonoscopy records today  Leland HerElsia J Tennie Grussing, DO PGY-1, [facility] 12/27/2015 1:32 PM

## 2016-01-08 ENCOUNTER — Ambulatory Visit: Payer: Self-pay | Admitting: Internal Medicine

## 2016-01-08 ENCOUNTER — Encounter: Payer: Self-pay | Admitting: Cardiovascular Disease

## 2016-01-08 ENCOUNTER — Encounter: Payer: Self-pay | Admitting: Internal Medicine

## 2016-01-08 ENCOUNTER — Ambulatory Visit (INDEPENDENT_AMBULATORY_CARE_PROVIDER_SITE_OTHER): Payer: Self-pay | Admitting: Cardiovascular Disease

## 2016-01-08 ENCOUNTER — Ambulatory Visit (INDEPENDENT_AMBULATORY_CARE_PROVIDER_SITE_OTHER): Payer: Self-pay | Admitting: Internal Medicine

## 2016-01-08 ENCOUNTER — Ambulatory Visit (INDEPENDENT_AMBULATORY_CARE_PROVIDER_SITE_OTHER)
Admission: RE | Admit: 2016-01-08 | Discharge: 2016-01-08 | Disposition: A | Discharge status: Home / Self Care | Payer: Self-pay | Source: Ambulatory Visit | Attending: Internal Medicine | Admitting: Internal Medicine

## 2016-01-08 VITALS — BP 140/80 | HR 82 | Ht 72.0 in | Wt 208.4 lb

## 2016-01-08 VITALS — BP 124/80 | HR 78 | Ht 73.0 in | Wt 209.4 lb

## 2016-01-08 DIAGNOSIS — I255 Ischemic cardiomyopathy: Secondary | ICD-10-CM

## 2016-01-08 DIAGNOSIS — I5022 Chronic systolic (congestive) heart failure: Secondary | ICD-10-CM

## 2016-01-08 DIAGNOSIS — R918 Other nonspecific abnormal finding of lung field: Secondary | ICD-10-CM

## 2016-01-08 MED ORDER — SILDENAFIL CITRATE 100 MG PO TABS: ORAL_TABLET | ORAL | 1 refills | Status: DC

## 2016-01-08 NOTE — Progress Notes (Signed)
Subjective:     Patient ID: Robert Hampton, male   DOB: [birth year],     MRN: [id number]     [age] yowm quit smoking August 31 2015 s/p admit:  Date of Admission: 09/27/2015                                          Date of Discharge: 10/07/2015    Primary Care Provider: No PCP Per Patient Consultants: Pulmonology, cariodiology   Discharge Diagnoses/Problem List:  Acute respiratory failure with hypoxia (HCC)   Uncontrolled type 2 DM- on oral agent    Acute systolic CHF    Anasarca   Elevated liver enzymes   CAP (community acquired pneumonia)-LUL   Cardiomyopathy- etiology not yet determined- appears to be NICM   ETOH abuse-daily drinker   Smoker   DVT-Rt lower extremity (HCC)   Pulmonary hypertension (HCC)  =  20/4.86 = 4.11 Woods units in setting of ? Acute PE   Abnormal CT scan of lung   Abnormal nuclear stress test   CHF (congestive heart failure) (HCC)   Coronary artery disease due to lipid rich plaque   LV dysfunction   S/P drug eluting coronary stent placement 10/06/15    10/25/2015 1st [facility] office visit/ Robert Hampton  On xarelto/ ACEi no resp rx  Chief Complaint  Patient presents with  . Hospitalization Follow-up    Pt states his breathing has imrpoved, but not back to his normal baseline yet. He feels SOB when he lies down flat. He has occ cough- non prod.    fell thru conveyer belt 26 y pta surgery required including pins and since then R leg swelling/painful learned to live with it on no medicines other than advil then more swollen x 2 weeks prior to admit with onset then fatigue / cough dry with bilateral flank R side worse mostly flank assoc with orthopnea rx uc with abx and generalized swelling and sweats /coughed to point of vomiting with essentially nl cxr 09/19/15 > admit 09/27/15  Cough less/ less cp / still fatigued  When perfectly flat notes cough/sob  rec No change rx     11/27/2015  f/u ov/Robert Hampton re:  F/u LUL pna  Chief Complaint  Patient presents with   . Follow-up    Breathing has improved slightly. No new co's today.    can walk a couple of miles a day including some hills @  nl pace really more limited  by leg pain / fatigue > breathing and no longer coughing at all  Rec F/u cxr      01/08/2016  f/u ov/Robert Hampton re: f/u ? Post obst pna LUL  Chief Complaint  Patient presents with  . Follow-up    Breathing is doing well and no new co's today.      Not limited by breathing from desired activities     No obvious day to day or daytime variability or assoc excess/ purulent sputum or mucus plugs or hemoptysis or cp or chest tightness, subjective wheeze or overt sinus or hb symptoms. No unusual exp hx or h/o childhood pna/ asthma or knowledge of premature birth.  Sleeping ok without nocturnal  or early am exacerbation  of respiratory  c/o's or need for noct saba. Also denies any obvious fluctuation of symptoms with weather or environmental changes or other aggravating or alleviating factors except as outlined above  Current Medications, Allergies, Complete Past Medical History, Past Surgical History, Family History, and Social History were reviewed in [name] record.  ROS  The following are not active complaints unless bolded sore throat, dysphagia, dental problems, itching, sneezing,  nasal congestion or excess/ purulent secretions, ear ache,   fever, chills, sweats, unintended wt loss, classically pleuritic or exertional cp,  orthopnea pnd or leg swelling, presyncope, palpitations, abdominal pain, anorexia, nausea, vomiting, diarrhea  or change in bowel or bladder habits, change in stools or urine, dysuria,hematuria,  rash, arthralgias, visual complaints, headache, numbness, weakness or ataxia or problems with walking or coordination,  change in mood/affect or memory.           Objective:   Physical Exam  amb wm nad    01/08/2016        210   11/27/2015       207   10/25/15 201 lb (91.2 kg)  10/24/15 200 lb  6.4 oz (90.9 kg)  10/16/15 199 lb (90.3 kg)    Vital signs reviewed   HEENT: nl dentition, turbinates, and oropharynx. Nl external ear canals without cough reflex   NECK :  without JVD/Nodes/TM/ nl carotid upstrokes bilaterally   LUNGS: no acc muscle use,  Nl contour chest which is clear to A and P bilaterally without cough on insp or exp maneuvers   CV:  RRR  no s3 or murmur or increase in P2, no edema   ABD:  soft and nontender with nl inspiratory excursion in the supine position. No bruits or organomegaly, bowel sounds nl  MS:  Nl gait/ ext warm without deformities, calf tenderness, cyanosis or clubbing No obvious joint restrictions   SKIN: warm and dry without lesions    NEURO:  alert, approp, nl sensorium with  no motor deficits      CXR PA and Lateral:   01/08/2016 :    I personally reviewed images and agree with radiology impression as follows:    Left upper lobe opacity may represent scarring from prior infection My impression: Vs 09/19/15 when this area was completely clear prior to clinical CAP so rec f/u 58m        Labs  reviewed:      Chemistry      Component Value Date/Time   NA 134 (L) 11/27/2015 1129   K 4.6 11/27/2015 1129   CL 99 11/27/2015 1129   CO2 30 11/27/2015 1129   BUN 20 11/27/2015 1129   CREATININE 0.74 11/27/2015 1129   CREATININE 0.83 09/19/2015 1544      Component Value Date/Time   CALCIUM 9.8 11/27/2015 1129   ALKPHOS 88 10/07/2015 0548   AST 22 10/07/2015 0548   ALT 44 10/07/2015 0548   BILITOT 1.2 10/07/2015 0548        Lab Results  Component Value Date   WBC 13.5 (H) 11/27/2015   HGB 16.1 11/27/2015   HCT 46.8 11/27/2015   MCV 88.6 11/27/2015   PLT 289.0 11/27/2015      Lab Results  Component Value Date   TSH 1.35 11/27/2015     Lab Results  Component Value Date   PROBNP 128.0 (H) 11/27/2015       Lab Results  Component Value Date   ESRSEDRATE 10 10/25/2015               Assessment:

## 2016-01-08 NOTE — Patient Instructions (Signed)
Medication Instructions:  Your physician has recommended you make the following change in your medication:  1. START Viagra 100mg  take one-half tablet by mouth as needed for erectile dysfunction  Labwork: No new orders.   Testing/Procedures: Your physician has requested that you have an echocardiogram. Echocardiography is a painless test that uses sound waves to create images of your heart. It provides your doctor with information about the size and shape of your heart and how well your heart's chambers and valves are working. This procedure takes approximately one hour. There are no restrictions for this procedure.  Follow-Up: Your physician wants you to follow-up in: 4 MONTHS with Dr Excell Seltzerooper.  You will receive a reminder letter in the mail two months in advance. If you don't receive a letter, please call our office to schedule the follow-up appointment.  Any Other Special Instructions Will Be Listed Below (If Applicable).     If you need a refill on your cardiac medications before your next appointment, please call your pharmacy.

## 2016-01-08 NOTE — Assessment & Plan Note (Signed)
09/27/2015 CTA chest showed possible hilar mass on CTA chest imaging;   - cxr 10/25/2015 marked improvement > f/u 11/27/2015 improved but not completely resolved    - better 01/08/2016 but not resolved rec f/u in 3 m (placed in reminder file)  Discussed in detail all the  indications, usual  risks and alternatives  relative to the benefits with patient who agrees to proceed with conservative f/u as outlined

## 2016-01-08 NOTE — Progress Notes (Signed)
Cardiology Office Note Date:  01/08/2016   ID:  Robert GeneraJonathan Mcglothen, DOB May 25, 1955, MRN [id number]  PCP:  Leland HerElsia J Yoo, DO  Cardiologist:  Tonny Bollmanooper, Javone Ybanez, MD    Chief Complaint  Patient presents with  . Coronary Artery Disease     History of Present Illness: Robert Hampton is a 60 y.o. male who presents for follow-up evaluation. He had a complicated hospitalization in July 2017 with respiratory failure and acute heart failure. In the setting of heart failure with LVEF 35% he was found to have pulmonary hypertension. He was found to have 3 vessel CAD at cath, underwent surgical evaluation and was not a good immediate surgical candidate in the context of his acute medical illness, and ultimately underwent 3 vessel PCI as part of the Leaders-Free Trial with [employer] drug-eluting stents. He was diagnosed with acute DVT in the leg and has been anticoagulated with Xarelto. He was also found to have pneumonia and repeat CT has been recommended. He has followed up with Dr Sherene SiresWert in the Pulmonary Clinic.   The patient is doing reasonably well since his last visit. He does complain of fatigue. His right hand continues to feel numb. He is able to use the hand but it's not as strong as the left side. No chest pain or shortness of breath. No lightheadedness or heart palpitations.   Past Medical History:  Diagnosis Date  . Abnormal CT scan of lung 08/2015  . Acute respiratory failure with hypoxia (HCC) 09/2015  . Acute systolic HF (heart failure) (HCC) 09/2015  . Coronary artery disease   . Diabetes mellitus without complication (HCC)   . Diverticulosis    shown on CT  . DVT (deep venous thrombosis) (HCC) 10/06/2015   RT LEG    Past Surgical History:  Procedure Laterality Date  . CARDIAC CATHETERIZATION N/A 10/04/2015   Procedure: Right/Left Heart Cath and Coronary Angiography;  Surgeon: Tonny BollmanMichael Derin Granquist, MD;  [facility];  Service: Cardiovascular;  Laterality: N/A;  . CARDIAC  CATHETERIZATION N/A 10/06/2015   Procedure: Coronary Stent Intervention;  Surgeon: Tonny BollmanMichael Luva Metzger, MD;  [facility];  Service: Cardiovascular;  Laterality: N/A;  . CORONARY STENT PLACEMENT  10/06/2015   Successful 3 vessel PCI using drug-eluting stents in the mid-RCA, mid-circumflex, and mid-LAD  . KNEE SURGERY      Current Outpatient Prescriptions  Medication Sig Dispense Refill  . carvedilol (COREG) 6.25 MG tablet TAKE 1 TABLET BY MOUTH TWICE DAILY WITH A MEAL 60 tablet 3  . clopidogrel (PLAVIX) 75 MG tablet TAKE 1 TABLET BY MOUTH EVERY MORNING WITH BREAKFAST 30 tablet 3  . folic acid (FOLVITE) 1 MG tablet Take 1 tablet (1 mg total) by mouth daily. 30 tablet 5  . furosemide (LASIX) 40 MG tablet TAKE 1 TABLET BY MOUTH EVERY DAY 30 tablet 3  . glipiZIDE (GLUCOTROL) 5 MG tablet Take 1 tablet (5 mg total) by mouth 2 (two) times daily before a meal. 60 tablet 3  . lisinopril (PRINIVIL,ZESTRIL) 5 MG tablet TAKE 1 TABLET BY MOUTH EVERY DAY 30 tablet 3  . metFORMIN (GLUCOPHAGE) 500 MG tablet TAKE 2 TABLETS BY MOUTH TWICE DAILY WITH FOOD 120 tablet 3  . Multiple Vitamin (MULTIVITAMIN WITH MINERALS) TABS tablet Take 1 tablet by mouth daily. 30 tablet 1  . rivaroxaban (XARELTO) 20 MG TABS tablet Take 20 mg by mouth daily with supper.    Marland Kitchen. spironolactone (ALDACTONE) 25 MG tablet TAKE 1/2 TABLET(12.5 MG) BY MOUTH DAILY 15 tablet  3  . thiamine 100 MG tablet Take 1 tablet (100 mg total) by mouth daily. 30 tablet 5   No current facility-administered medications for this visit.     Allergies:   Bee venom   Social History:  The patient  reports that he quit smoking about 4 months ago. His smoking use included Cigarettes. He quit after 40.00 years of use. He has never used smokeless tobacco. He reports that he drinks alcohol. He reports that he does not use drugs.   Family History:  The patient's  family history includes Heart attack in his mother; Hypertension in his mother.    ROS:   Please see the history of present illness.  Otherwise, review of systems is positive for erectile dysfunction and leg swelling.  All other systems are reviewed and negative.    PHYSICAL EXAM: VS:  BP 140/80   Pulse 82   Ht 6' (1.829 m)   Wt 208 lb 6.4 oz (94.5 kg)   BMI 28.26 kg/m  , BMI Body mass index is 28.26 kg/m. GEN: Well nourished, well developed, in no acute distress  HEENT: normal  Neck: no JVD, no masses. No carotid bruits Cardiac: RRR without murmur or gallop                Respiratory:  clear to auscultation bilaterally, normal work of breathing GI: soft, nontender, nondistended, + BS MS: no deformity or atrophy  Ext: no pretibial edema, pedal pulses 2+= bilaterally, right radial pulse absent, strong ulnar pulse Skin: warm and dry, no rash Neuro:  Strength and sensation are intact Psych: euthymic mood, full affect  EKG:  EKG is not ordered today.  Recent Labs: 09/30/2015: B Natriuretic Peptide 1,061.5 10/07/2015: ALT 44; Magnesium 1.8 11/27/2015: BUN 20; Creatinine, Ser 0.74; Hemoglobin 16.1; Platelets 289.0; Potassium 4.6; Pro B Natriuretic peptide (BNP) 128.0; Sodium 134; TSH 1.35   Lipid Panel  No results found for: CHOL, TRIG, HDL, CHOLHDL, VLDL, LDLCALC, LDLDIRECT    Wt Readings from Last 3 Encounters:  01/08/16 208 lb 6.4 oz (94.5 kg)  12/27/15 205 lb (93 kg)  11/27/15 206 lb 9.6 oz (93.7 kg)      ASSESSMENT AND PLAN: 1.  Right leg DVT - continues on Xarelto.  2. CAD, native vessel: no anginal symptoms after multivessel stenting.  Continue plavix alone in setting of chronic anticoagulation.   3. Right radial artery occlusion: slowly improving but residual numbness noted.   4. Chronic systolic heart failure: NYHA IIB. Continue same Rx. Needs FU echo - will order. FU 4 months  5. Erectile dysfunction: Rx for Viagra written  Current medicines are reviewed with the patient today.  The patient does not have concerns regarding medicines.  Labs/ tests  ordered today include:  No orders of the defined types were placed in this encounter.   Disposition:   FU 4 months  Signed, Tonny Bollman, MD  01/08/2016 11:02 AM    [facility] [address] [place], [place], [place]  [address] Phone: [phone]; Fax: [phone]

## 2016-01-08 NOTE — Patient Instructions (Addendum)
Please remember to go to the  x-ray department downstairs for your tests - we will call you with the results when they are available.  Late add: small peripheral density persists but overall much better vs cap and was not present 8 days before cap by cxr.

## 2016-01-09 ENCOUNTER — Telehealth: Payer: Self-pay | Admitting: *Deleted

## 2016-01-09 NOTE — Progress Notes (Signed)
Spoke with pt and notified of results per Dr. Wert. Pt verbalized understanding and denied any questions. 

## 2016-01-09 NOTE — Telephone Encounter (Signed)
Left message on patient's voice mail that next research appointment window is 03-19-16-----04-18-16 and that the research office would call him to schedule when it was a bit closer. Encouraged to call research office for any questions number left.

## 2016-01-20 ENCOUNTER — Other Ambulatory Visit: Payer: Self-pay | Admitting: Family Medicine

## 2016-01-20 DIAGNOSIS — E1110 Type 2 diabetes mellitus with ketoacidosis without coma: Secondary | ICD-10-CM

## 2016-01-22 ENCOUNTER — Ambulatory Visit (HOSPITAL_COMMUNITY): Payer: Self-pay | Attending: Cardiovascular Disease

## 2016-01-22 ENCOUNTER — Other Ambulatory Visit: Payer: Self-pay

## 2016-01-22 DIAGNOSIS — I5022 Chronic systolic (congestive) heart failure: Secondary | ICD-10-CM | POA: Insufficient documentation

## 2016-01-24 ENCOUNTER — Other Ambulatory Visit: Payer: Self-pay

## 2016-01-24 DIAGNOSIS — I82411 Acute embolism and thrombosis of right femoral vein: Secondary | ICD-10-CM

## 2016-01-29 ENCOUNTER — Other Ambulatory Visit: Payer: Self-pay | Admitting: Family Medicine

## 2016-01-30 ENCOUNTER — Other Ambulatory Visit: Payer: Self-pay | Admitting: *Deleted

## 2016-01-30 DIAGNOSIS — E1110 Type 2 diabetes mellitus with ketoacidosis without coma: Secondary | ICD-10-CM

## 2016-01-30 MED ORDER — GLIPIZIDE 5 MG PO TABS: 5.0000 mg | ORAL_TABLET | Freq: Two times a day (BID) | ORAL | 3 refills | Status: DC

## 2016-01-31 ENCOUNTER — Inpatient Hospital Stay (HOSPITAL_COMMUNITY): Admission: RE | Admit: 2016-01-31 | Payer: Self-pay | Source: Ambulatory Visit

## 2016-02-05 ENCOUNTER — Other Ambulatory Visit: Payer: Self-pay | Admitting: Family Medicine

## 2016-02-05 DIAGNOSIS — E1110 Type 2 diabetes mellitus with ketoacidosis without coma: Secondary | ICD-10-CM

## 2016-02-05 NOTE — Telephone Encounter (Signed)
Pt states pharmacy does not have Rivaroxaban 20mg  that was called in last week. Please advise. Thanks! ep

## 2016-02-06 NOTE — Telephone Encounter (Signed)
Called in glipizide to patient's regular pharmacy and xarelto to [facility] on [address]. Called patient to let him know that I refilled both prescriptions by phone.

## 2016-02-07 ENCOUNTER — Ambulatory Visit (HOSPITAL_COMMUNITY)
Admission: RE | Admit: 2016-02-07 | Discharge: 2016-02-07 | Disposition: A | Discharge status: Home / Self Care | Payer: Self-pay | Source: Ambulatory Visit | Attending: Cardiovascular Disease | Admitting: Cardiovascular Disease

## 2016-02-07 ENCOUNTER — Other Ambulatory Visit: Payer: Self-pay | Admitting: Family Medicine

## 2016-02-07 DIAGNOSIS — I82411 Acute embolism and thrombosis of right femoral vein: Secondary | ICD-10-CM

## 2016-02-08 ENCOUNTER — Encounter: Payer: Self-pay | Admitting: Family Medicine

## 2016-03-03 ENCOUNTER — Other Ambulatory Visit: Payer: Self-pay | Admitting: Family Medicine

## 2016-03-18 ENCOUNTER — Other Ambulatory Visit: Payer: Self-pay | Admitting: *Deleted

## 2016-03-18 MED ORDER — SPIRONOLACTONE 25 MG PO TABS: ORAL_TABLET | ORAL | 3 refills | Status: DC

## 2016-03-29 ENCOUNTER — Other Ambulatory Visit: Payer: Self-pay | Admitting: *Deleted

## 2016-03-29 MED ORDER — LISINOPRIL 5 MG PO TABS: 5.0000 mg | ORAL_TABLET | Freq: Every day | ORAL | 3 refills | Status: DC

## 2016-03-29 MED ORDER — CARVEDILOL 6.25 MG PO TABS: 6.2500 mg | ORAL_TABLET | Freq: Two times a day (BID) | ORAL | 3 refills | Status: DC

## 2016-03-31 ENCOUNTER — Other Ambulatory Visit: Payer: Self-pay | Admitting: Family Medicine

## 2016-03-31 DIAGNOSIS — E1110 Type 2 diabetes mellitus with ketoacidosis without coma: Secondary | ICD-10-CM

## 2016-04-07 ENCOUNTER — Other Ambulatory Visit: Payer: Self-pay | Admitting: Family Medicine

## 2016-04-09 ENCOUNTER — Other Ambulatory Visit: Payer: Self-pay | Admitting: *Deleted

## 2016-04-09 MED ORDER — FUROSEMIDE 40 MG PO TABS: 40.0000 mg | ORAL_TABLET | Freq: Every day | ORAL | 3 refills | Status: DC

## 2016-04-10 ENCOUNTER — Ambulatory Visit (INDEPENDENT_AMBULATORY_CARE_PROVIDER_SITE_OTHER)
Admission: RE | Admit: 2016-04-10 | Discharge: 2016-04-10 | Disposition: A | Discharge status: Home / Self Care | Payer: Self-pay | Source: Ambulatory Visit | Attending: Internal Medicine | Admitting: Internal Medicine

## 2016-04-10 ENCOUNTER — Encounter: Payer: Self-pay | Admitting: Internal Medicine

## 2016-04-10 DIAGNOSIS — J189 Pneumonia, unspecified organism: Secondary | ICD-10-CM

## 2016-04-11 NOTE — Progress Notes (Signed)
Spoke with pt and notified of results per Dr. Wert. Pt verbalized understanding and denied any questions. 

## 2016-04-13 ENCOUNTER — Other Ambulatory Visit: Payer: Self-pay | Admitting: Family Medicine

## 2016-04-15 ENCOUNTER — Encounter: Payer: Self-pay | Admitting: Internal Medicine

## 2016-04-15 ENCOUNTER — Ambulatory Visit (INDEPENDENT_AMBULATORY_CARE_PROVIDER_SITE_OTHER): Payer: Self-pay | Admitting: Internal Medicine

## 2016-04-15 VITALS — BP 130/80 | HR 89 | Ht 72.0 in | Wt 215.0 lb

## 2016-04-15 DIAGNOSIS — I272 Pulmonary hypertension, unspecified: Secondary | ICD-10-CM

## 2016-04-15 DIAGNOSIS — R918 Other nonspecific abnormal finding of lung field: Secondary | ICD-10-CM

## 2016-04-15 DIAGNOSIS — R0609 Other forms of dyspnea: Secondary | ICD-10-CM

## 2016-04-15 DIAGNOSIS — R0600 Dyspnea, unspecified: Secondary | ICD-10-CM

## 2016-04-15 NOTE — Progress Notes (Signed)
Subjective:     Patient ID: Robert Hampton, male   DOB: 01/24/1956     MRN: [id number]    [id number]   yowm quit smoking August 31 2015 s/p admit:  Date of Admission: 09/27/2015                                          Date of Discharge: 10/07/2015    Primary Care Provider: No PCP Per Patient Consultants: Pulmonology, cariodiology   Discharge Diagnoses/Problem List:  Acute respiratory failure with hypoxia (HCC)   Uncontrolled type 2 DM- on oral agent    Acute systolic CHF    Anasarca   Elevated liver enzymes   CAP (community acquired pneumonia)-LUL   Cardiomyopathy- etiology not yet determined- appears to be NICM   ETOH abuse-daily drinker   Smoker   DVT-Rt lower extremity (HCC)   Pulmonary hypertension (HCC)  =  20/4.86 = 4.11 [place] units in setting of ? Acute PE   Abnormal CT scan of lung   Abnormal nuclear stress test   CHF (congestive heart failure) (HCC)   Coronary artery disease due to lipid rich plaque   LV dysfunction   S/P drug eluting coronary stent placement 10/06/15     10/25/2015 1st [name] office visit/ Robert Hampton  On xarelto/ ACEi no resp rx  Chief Complaint  Patient presents with  . Hospitalization Follow-up    Pt states his breathing has imrpoved, but not back to his normal baseline yet. He feels SOB when he lies down flat. He has occ cough- non prod.    fell thru conveyer belt 25 y pta surgery required including pins and since then R leg swelling/painful learned to live with it on no medicines other than advil then more swollen x 2 weeks prior to admit with onset then fatigue / cough dry with bilateral flank R side worse mostly flank assoc with orthopnea rx uc with abx and generalized swelling and sweats /coughed to point of vomiting with essentially nl cxr 09/19/15 > admit 09/27/15  Cough less/ less cp / still fatigued  When perfectly flat notes cough/sob  rec No change rx     11/27/2015  f/u ov/Robert Hampton re:  F/u LUL pna  Chief Complaint  Patient presents with   . Follow-up    Breathing has improved slightly. No new co's today.    can walk a couple of miles a day including some hills @  nl pace really more limited  by leg pain / fatigue > breathing and no longer coughing at all  Rec F/u cxr     04/15/2016  f/u ov/Robert Hampton re: f/u abn cxr still not smoking  Chief Complaint  Patient presents with  . Follow-up    Breathing is doing well and denies any new co's today.    Not limited by breathing from desired activities though not aerobic / sleeping fine without am cough and no symptoms of swelling in the R Leg      No obvious day to day or daytime variability or assoc excess/ purulent sputum or mucus plugs or hemoptysis or cp or chest tightness, subjective wheeze or overt sinus or hb symptoms. No unusual exp hx or h/o childhood pna/ asthma or knowledge of premature birth.  Sleeping ok without nocturnal  or early am exacerbation  of respiratory  c/o's or need for noct saba. Also denies any obvious fluctuation  of symptoms with weather or environmental changes or other aggravating or alleviating factors except as outlined above   Current Medications, Allergies, Complete Past Medical History, Past Surgical History, Family History, and Social History were reviewed in [name] record.  ROS  The following are not active complaints unless bolded sore throat, dysphagia, dental problems, itching, sneezing,  nasal congestion or excess/ purulent secretions, ear ache,   fever, chills, sweats, unintended wt loss, classically pleuritic or exertional cp,  orthopnea pnd or leg swelling, presyncope, palpitations, abdominal pain, anorexia, nausea, vomiting, diarrhea  or change in bowel or bladder habits, change in stools or urine, dysuria,hematuria,  rash, arthralgias, visual complaints, headache, numbness, weakness or ataxia or problems with walking or coordination,  change in mood/affect or memory.           Objective:   Physical Exam  amb wm  nad   04/15/2016       215  01/08/2016        210   11/27/2015       207   10/25/15 201 lb (91.2 kg)  10/24/15 200 lb 6.4 oz (90.9 kg)  10/16/15 199 lb (90.3 kg)    Vital signs reviewed - Note on arrival 02 sats  99% on RA      HEENT: nl dentition, turbinates, and oropharynx. Nl external ear canals without cough reflex   NECK :  without JVD/Nodes/TM/ nl carotid upstrokes bilaterally   LUNGS: no acc muscle use,  Nl contour chest which is clear to A and P bilaterally without cough on insp or exp maneuvers   CV:  RRR  no s3 or murmur or increase in P2, no edema   ABD:  soft and nontender with nl inspiratory excursion in the supine position. No bruits or organomegaly, bowel sounds nl  MS:  Nl gait/ ext warm without deformities, calf tenderness, cyanosis or clubbing No obvious joint restrictions   SKIN: warm and dry without lesions    NEURO:  alert, approp, nl sensorium with  no motor deficits     CXR PA and Lateral:   04/15/2016 :    I personally reviewed images and agree with radiology impression as follows:   COPD. No acute pneumonia. Decreased conspicuity of density in the left upper lobe suggests the finding previously was related to infection               Assessment:

## 2016-04-15 NOTE — Assessment & Plan Note (Addendum)
RHC 10/04/15   PA mean = 41 - PCWP 21 = 20 /4.86 CO = 4.11 in setting of PE 09/28/15  - Echo 01/22/16 nl PAS  (resolved on rx for chf and PE)    The challenge will be whether to ever take him off anticoagulation given likely hx of longstanding venous insuff in R leg following remote injury.  I could not access his last venous dopplers but since followed by Dr Excell Seltzerooper anyway there is no reason to return here for risk assessment and if second opinion is needed would refer to vascular sugery or Dr Cyndie ChimeGranfortuna re risk of recurrence of dvt, the source of his PE   Pulmonary f/u is prn

## 2016-04-15 NOTE — Assessment & Plan Note (Signed)
09/27/2015 CTA chest showed possible hilar mass on CTA chest imaging;   - cxr 10/25/2015 marked improvement > f/u 11/27/2015 improved but not completely resolved  - better 01/08/2016 but not resolved  - cxr clear 04/15/2016 > no dedicated f/u needed

## 2016-04-15 NOTE — Patient Instructions (Signed)
Your cxr has cleared and you do not have any significant copd and you never will unless you resume smoking  Pulmonary follow up is as needed

## 2016-04-15 NOTE — Assessment & Plan Note (Addendum)
Spirometry 04/15/2016  wnl with unusual f/v contour no obst curvature off all rx   This has resolved to pt's satisfaction  I had an extended final summary discussion with the patient reviewing all relevant studies completed to date and  lasting 15 to 20 minutes of a 25 minute visit on the following issues:    I reviewed the Fletcher curve with the patient that basically indicates  if you quit smoking when your best day FEV1 is still well preserved (as is clearly  the case here)  it is highly unlikely you will progress to severe disease and informed the patient there was  no medication on the market that has proven to alter the curve/ its downward trajectory  or the likelihood of progression of their disease(unlike other chronic medical conditions such as atheroclerosis where we do think we can change the natural hx with risk reducing meds)    Therefore   maintaining abstinence is the most important aspect of his care, not choice of inhalers or for that matter, doctors.

## 2016-04-16 ENCOUNTER — Encounter: Payer: Self-pay | Admitting: Cardiovascular Disease

## 2016-04-16 ENCOUNTER — Encounter: Payer: Self-pay | Admitting: *Deleted

## 2016-04-16 DIAGNOSIS — Z006 Encounter for examination for normal comparison and control in clinical research program: Secondary | ICD-10-CM

## 2016-04-16 NOTE — Progress Notes (Signed)
Leaders Free II Research study month 6 follow up completed. Patient denies any adverse events, changes in his medication or angina. EKG obtained. Next research required visit is due no later than 01/AUG/2018. Questions encouraged and answered.

## 2016-06-07 ENCOUNTER — Other Ambulatory Visit: Payer: Self-pay | Admitting: Family Medicine

## 2016-06-12 ENCOUNTER — Telehealth: Payer: Self-pay | Admitting: Family Medicine

## 2016-06-12 NOTE — Telephone Encounter (Signed)
Pt called because he is on Xarelto. The problem is they have increased a lot in price. He needs to know if he needs to be on this medication and if not take him off. If he needs this medication can we find an alternate that would be more affordable. Please let patient know what he should do.jw

## 2016-06-14 ENCOUNTER — Telehealth: Payer: Self-pay | Admitting: Family Medicine

## 2016-06-14 NOTE — Telephone Encounter (Signed)
Called patient back to discuss options but patient preferred to talk in person. Scheduled him for appointment with me on 06/24/16 to discuss samples (lasts ~2 weeks) vs switching to another DOAC (temporary even with copay card) vs warfarin vs DC off anticoag with only ASA - all not great options.

## 2016-06-14 NOTE — Telephone Encounter (Signed)
Entered in error

## 2016-06-23 NOTE — Progress Notes (Signed)
    Subjective:  Robert Hampton is a 61 y.o. male who [facility] for follow up on anticoagulation   HPI:  Chronic anticoagulation d/t R LE DVT during hospital admission in June 2017 with questionable PE on CT chest at that time - Has been on xarelto, used copay card but card has expired and how having difficulty affording medication - hx of longstanding venous insuff in R leg following remote injury - repeat LE doppler on 02/07/2016 showed resolution of DVT - Does not want to go on warfarin or be off anticoagulation  - Otherwise doing well. No other concerns today.   ROS: Per HPI  Objective:  Physical Exam: BP 124/64   Pulse 80   Temp 97.9 F (36.6 C) (Oral)   Ht 6' (1.829 m)   Wt 221 lb (100.2 kg)   SpO2 96%   BMI 29.97 kg/m   Gen: NAD, resting comfortably CV: RRR with no murmurs appreciated Pulm: NWOB, CTAB with no crackles, wheezes, or rhonchi MSK: no edema, cyanosis, or clubbing noted Skin: warm, dry Neuro: grossly normal, moves all extremities Psych: Normal affect and thought content   Assessment/Plan:  Chronic anticoagulation Discussed risks vs benefits of chronic anticoagulation with patient. Suspect that R leg has longstanding venous insufficiency from crush injury remotely in the past. Since had proximal R LE DVT in June 2017, likely that patient may need to be on lifelong anticoagulation. Discussed with Dr. Raymondo BandKoval that patient has no good long term options for anticoagulation given issues with cost. Patient voices good understanding but wants to continue exhausting all options. - Patient given copay card and switched to Eliquis today.    Leland HerElsia J Ritha Sampedro, DO PGY-1, [facility] 06/24/2016 12:03 PM

## 2016-06-24 ENCOUNTER — Ambulatory Visit (INDEPENDENT_AMBULATORY_CARE_PROVIDER_SITE_OTHER): Payer: BLUE CROSS/BLUE SHIELD | Admitting: Family Medicine

## 2016-06-24 ENCOUNTER — Encounter: Payer: Self-pay | Admitting: Family Medicine

## 2016-06-24 VITALS — BP 124/64 | HR 80 | Temp 97.9°F | Ht 72.0 in | Wt 221.0 lb

## 2016-06-24 DIAGNOSIS — Z7901 Long term (current) use of anticoagulants: Secondary | ICD-10-CM | POA: Diagnosis not present

## 2016-06-24 DIAGNOSIS — E119 Type 2 diabetes mellitus without complications: Secondary | ICD-10-CM | POA: Diagnosis not present

## 2016-06-24 DIAGNOSIS — I82401 Acute embolism and thrombosis of unspecified deep veins of right lower extremity: Secondary | ICD-10-CM | POA: Diagnosis not present

## 2016-06-24 LAB — POCT GLYCOSYLATED HEMOGLOBIN (HGB A1C): Hemoglobin A1C: 6.1

## 2016-06-24 MED ORDER — APIXABAN 5 MG PO TABS: 5.0000 mg | ORAL_TABLET | Freq: Two times a day (BID) | ORAL | 3 refills | Status: DC

## 2016-06-24 NOTE — Patient Instructions (Addendum)
It was great to see you again!  For your anticoagulation,  - Please switch from xarelto to eliquis 5mg  twice a day.  We are checking some labs today, and someone will call you or send you a letter with the results when they are available.   Please make an appointment to see how you are doing on your diabetes in 1 month  Take care and seek immediate care sooner if you develop any concerns.   Dr. Leland HerElsia J Carsen Leaf, DO [facility]

## 2016-06-24 NOTE — Assessment & Plan Note (Signed)
Discussed risks vs benefits of chronic anticoagulation with patient. Suspect that R leg has longstanding venous insufficiency from crush injury remotely in the past. Since had proximal R LE DVT in June 2017, likely that patient may need to be on lifelong anticoagulation. Discussed with Dr. Raymondo Band that patient has no good long term options for anticoagulation given issues with cost. Patient voices good understanding but wants to continue exhausting all options. - Patient given copay card and switched to Eliquis today.

## 2016-06-28 ENCOUNTER — Other Ambulatory Visit: Payer: Self-pay | Admitting: Family Medicine

## 2016-07-02 ENCOUNTER — Other Ambulatory Visit: Payer: Self-pay | Admitting: Family Medicine

## 2016-07-02 DIAGNOSIS — E1110 Type 2 diabetes mellitus with ketoacidosis without coma: Secondary | ICD-10-CM

## 2016-07-07 ENCOUNTER — Other Ambulatory Visit: Payer: Self-pay | Admitting: Family Medicine

## 2016-07-21 ENCOUNTER — Other Ambulatory Visit: Payer: Self-pay | Admitting: Family Medicine

## 2016-08-04 ENCOUNTER — Other Ambulatory Visit: Payer: Self-pay | Admitting: Family Medicine

## 2016-08-23 ENCOUNTER — Other Ambulatory Visit: Payer: Self-pay | Admitting: Family Medicine

## 2016-08-23 DIAGNOSIS — E1110 Type 2 diabetes mellitus with ketoacidosis without coma: Secondary | ICD-10-CM

## 2016-09-09 ENCOUNTER — Encounter: Payer: Self-pay | Admitting: *Deleted

## 2016-09-09 DIAGNOSIS — Z006 Encounter for examination for normal comparison and control in clinical research program: Secondary | ICD-10-CM

## 2016-09-09 NOTE — Progress Notes (Signed)
LEADERS FREE II Research study follow up visit completed. Patient denies angina, stopped Xarelto in march and started Eliquis 5 mg daily on 06/24/16. EKG obtained. Next research required visit is a phone call at years 2 & 3.

## 2016-09-10 ENCOUNTER — Other Ambulatory Visit: Payer: Self-pay | Admitting: Family Medicine

## 2016-09-10 ENCOUNTER — Telehealth: Payer: Self-pay | Admitting: Family Medicine

## 2016-09-10 DIAGNOSIS — E119 Type 2 diabetes mellitus without complications: Secondary | ICD-10-CM | POA: Insufficient documentation

## 2016-09-10 DIAGNOSIS — I2510 Atherosclerotic heart disease of native coronary artery without angina pectoris: Secondary | ICD-10-CM

## 2016-09-10 DIAGNOSIS — I2583 Coronary atherosclerosis due to lipid rich plaque: Principal | ICD-10-CM

## 2016-09-10 NOTE — Telephone Encounter (Signed)
That's fine to have a lab only visit. Will place future order for a1c and lipid panel in separate orders only encounter. Patient can make appointment at his convenience and should be fasting >8hr before. Thanks.

## 2016-09-10 NOTE — Telephone Encounter (Signed)
Pt called and needs his latest labs faxed to [phone] for a trial he is wanting to do. jw

## 2016-09-10 NOTE — Telephone Encounter (Signed)
Contacted pt to see what labs exactly he needed fax. I also informed pt we did not have any recent lab work. Pt stated he needed his last A1C for sure and will call back when he finds the lists. Pt has an apt for the first week in July, I suggested he wait until then for updated lab work, pt stated he needs labs faxed before then to be "qualified." I will offer a lab visit only apt when he call back.

## 2016-09-10 NOTE — Telephone Encounter (Signed)
Does pt need a lab only visit?? His recent lab work and a1c, 12/2015. He has an apt for an office visit for the first week in July, he needs his labs faxed before then. Please advise.

## 2016-09-10 NOTE — Telephone Encounter (Signed)
He needs: a1c, hdl Triglycerides ldl Total cholestrol This is exactly what he needs to be faxed over

## 2016-09-11 NOTE — Telephone Encounter (Signed)
Patient scheduled for lab only visit Robert Hampton,CMA

## 2016-09-16 ENCOUNTER — Encounter: Payer: Self-pay | Admitting: Family Medicine

## 2016-09-16 ENCOUNTER — Other Ambulatory Visit (INDEPENDENT_AMBULATORY_CARE_PROVIDER_SITE_OTHER): Payer: BLUE CROSS/BLUE SHIELD

## 2016-09-16 DIAGNOSIS — E119 Type 2 diabetes mellitus without complications: Secondary | ICD-10-CM | POA: Diagnosis not present

## 2016-09-16 DIAGNOSIS — I2583 Coronary atherosclerosis due to lipid rich plaque: Principal | ICD-10-CM

## 2016-09-16 DIAGNOSIS — I2510 Atherosclerotic heart disease of native coronary artery without angina pectoris: Secondary | ICD-10-CM

## 2016-09-16 LAB — POCT GLYCOSYLATED HEMOGLOBIN (HGB A1C): HEMOGLOBIN A1C: 6.8

## 2016-09-17 LAB — LIPID PANEL
CHOL/HDL RATIO: 6.1 ratio — AB (ref 0.0–5.0)
CHOLESTEROL TOTAL: 231 mg/dL — AB (ref 100–199)
HDL: 38 mg/dL — ABNORMAL LOW (ref >39)
LDL CALC: 144 mg/dL — AB (ref 0–99)
TRIGLYCERIDES: 246 mg/dL — AB (ref 0–149)
VLDL Cholesterol Cal: 49 mg/dL — ABNORMAL HIGH (ref 5–40)

## 2016-09-18 ENCOUNTER — Other Ambulatory Visit: Payer: Self-pay | Admitting: Family Medicine

## 2016-09-18 ENCOUNTER — Telehealth: Payer: Self-pay | Admitting: Family Medicine

## 2016-09-18 DIAGNOSIS — E785 Hyperlipidemia, unspecified: Secondary | ICD-10-CM

## 2016-09-18 MED ORDER — ATORVASTATIN CALCIUM 80 MG PO TABS: 80.0000 mg | ORAL_TABLET | Freq: Every day | ORAL | 0 refills | Status: DC

## 2016-09-18 NOTE — Telephone Encounter (Signed)
Discussed results of lipid profile. Patient amenable to starting atorvastatin 80mg  qd. Sent in Rx to [facility] [address]. Will see how patient is tolerating medication at next visit on 09/30/16.

## 2016-09-30 ENCOUNTER — Ambulatory Visit (INDEPENDENT_AMBULATORY_CARE_PROVIDER_SITE_OTHER): Payer: BLUE CROSS/BLUE SHIELD | Admitting: Family Medicine

## 2016-09-30 ENCOUNTER — Encounter: Payer: Self-pay | Admitting: Family Medicine

## 2016-09-30 VITALS — BP 140/80 | HR 82 | Temp 98.7°F | Ht 72.0 in | Wt 222.0 lb

## 2016-09-30 DIAGNOSIS — I5022 Chronic systolic (congestive) heart failure: Secondary | ICD-10-CM | POA: Diagnosis not present

## 2016-09-30 DIAGNOSIS — E119 Type 2 diabetes mellitus without complications: Secondary | ICD-10-CM | POA: Diagnosis not present

## 2016-09-30 DIAGNOSIS — Z23 Encounter for immunization: Secondary | ICD-10-CM

## 2016-09-30 MED ORDER — ZOSTER VACCINE LIVE 19400 UNT/0.65ML ~~LOC~~ SUSR: 0.6500 mL | Freq: Once | SUBCUTANEOUS | 0 refills | Status: AC

## 2016-09-30 NOTE — Progress Notes (Signed)
    Subjective:  Robert Hampton is a 61 y.o. male who [facility] diabetes follow up.  HPI:  Diabetes - Taking metformin 1000mg  BID and glipizide 5mg  qd, tolerating well, compliant all of the time. - Does have some occasional low sugars that occurs once every few months. No syncope/falls. - Diabetic Review of Systems: diabetic diet compliance: compliant most of the time, home glucose monitoring: is performed regularly, further diabetic ROS: no polyuria or polydipsia, no chest pain, dyspnea or TIA's, no numbness, tingling or pain in extremities, no unusual visual symptoms, no medication side effects noted.    CHF - Doing well, no CP or SOB. No orthopnea - Does have some LE edema on R leg, worse with standing and leg will also ache. Resolves with elevating leg. Thinks may be more related to his remote crush injury with now venous insufficiency. - States last saw Dr Excell Seltzerooper in October ans has been waiting for follow up appointment, last time called was told that he was on a waiting list but may be referring to research trial.   ROS: Per HPI  Objective:  Physical Exam: BP 140/80   Pulse 82   Temp 98.7 F (37.1 C) (Oral)   Ht 6' (1.829 m)   Wt 222 lb (100.7 kg)   SpO2 97%   BMI 30.11 kg/m   Gen: NAD, resting comfortably CV: RRR with no murmurs appreciated Pulm: NWOB, CTAB with no crackles, wheezes, or rhonchi GI: Normal bowel sounds present. Soft, Nontender, Nondistended. MSK: R leg with prominent veins but no rashes or redness. No LE edema b/l Skin: warm, dry Neuro: grossly normal, moves all extremities Psych: Normal affect and thought content   Assessment/Plan:  CHF (congestive heart failure) (HCC) Stable, improved. Appears euvolemic and doing well today. Per chart review, echo 12/2015 showed improved LV function EF 45-50% from previous 30-35% in 08/2015.  - Continue current management - Will reach out to cardiology regarding patient's follow up  appointment  Diabetes mellitus without complication (HCC) Last a1c 6.8 and doing well overall. Low sugars at home are bit concerning.  - Continue current medications - Asked patient to check blood glucose with home monitor at these times and bring back log  - Discussed red flags: syncope, lightheadedness, diaphoresis. - Follow up in 3 months.    Leland HerElsia J Yoo, DO PGY-2, [facility] 09/30/2016 3:21 PM

## 2016-09-30 NOTE — Patient Instructions (Addendum)
It was great to see you again!  For your diabetes, - No changes to your medications. - Please check your sugars when you feel low, along with your morning fastings and bring log to next appointment.  I will place a referral for diabetic eye exam, please let us know if you have not heard back within 2 weeks.   Take care,  Dr. Leland HerElsia J Atticus Wedin, DO [facility]

## 2016-10-01 NOTE — Assessment & Plan Note (Addendum)
Last a1c 6.8 and doing well overall. Low sugars at home are bit concerning.  - Continue current medications - Asked patient to check blood glucose with home monitor at these times and bring back log  - Discussed red flags: syncope, lightheadedness, diaphoresis, etc. - Follow up in 3 months.

## 2016-10-01 NOTE — Assessment & Plan Note (Signed)
Stable, improved. Appears euvolemic and doing well today. Per chart review, echo 12/2015 showed improved LV function EF 45-50% from previous 30-35% in 08/2015.  - Continue current management - Will reach out to cardiology regarding patient's follow up appointment

## 2016-10-07 ENCOUNTER — Telehealth: Payer: Self-pay | Admitting: Family Medicine

## 2016-10-07 DIAGNOSIS — E785 Hyperlipidemia, unspecified: Secondary | ICD-10-CM

## 2016-10-07 MED ORDER — CLOPIDOGREL BISULFATE 75 MG PO TABS: ORAL_TABLET | ORAL | 1 refills | Status: DC

## 2016-10-07 MED ORDER — ATORVASTATIN CALCIUM 80 MG PO TABS: 80.0000 mg | ORAL_TABLET | Freq: Every day | ORAL | 1 refills | Status: DC

## 2016-10-07 NOTE — Telephone Encounter (Signed)
Pt called because he would like a refill on his Plavix and Lipitor to be called into a NEW PHARMACY because it is a lot cheaper. He would like us to use [facility], and of we can do 90 day qty he would even get a cheaper price. jw

## 2016-10-11 ENCOUNTER — Telehealth: Payer: Self-pay | Admitting: Family Medicine

## 2016-10-11 MED ORDER — ZOSTER VACCINE LIVE 19400 UNT/0.65ML ~~LOC~~ SUSR: 0.6500 mL | Freq: Once | SUBCUTANEOUS | 0 refills | Status: AC

## 2016-10-11 NOTE — Telephone Encounter (Signed)
Pt would like to have the [facility]les vaccine [facility]. ep

## 2016-10-11 NOTE — Telephone Encounter (Signed)
Rx for [facility]ostavax sent to [facility].

## 2016-10-19 ENCOUNTER — Other Ambulatory Visit: Payer: Self-pay | Admitting: Family Medicine

## 2016-10-22 IMAGING — DX DG CHEST 2V
2 series · 2 of 2 positions shown · non-contrast
Comparison: 10/25/2015 an 11/27/2015 chest x-ray

CLINICAL DATA: 59-year-old diabetic male smoker with history of
pneumonia. Subsequent encounter.

EXAM:
CHEST  2 VIEW

[chest pa]
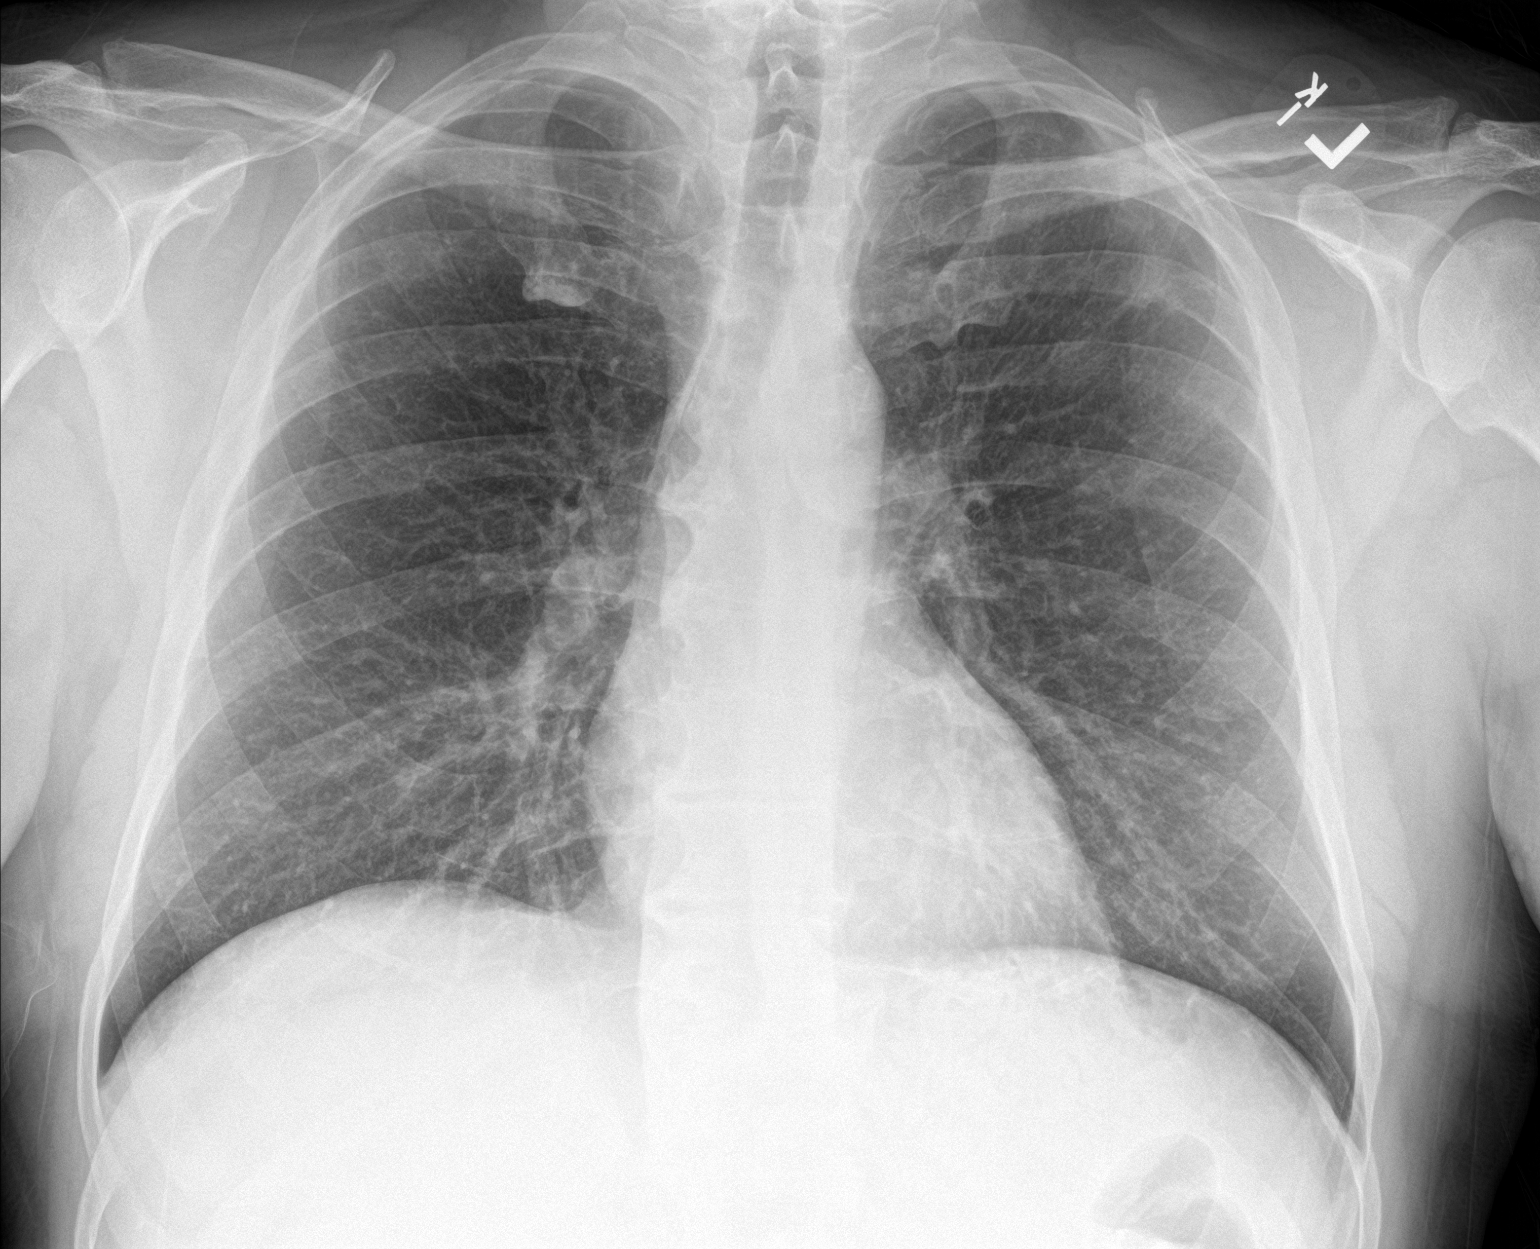

[chest lat]
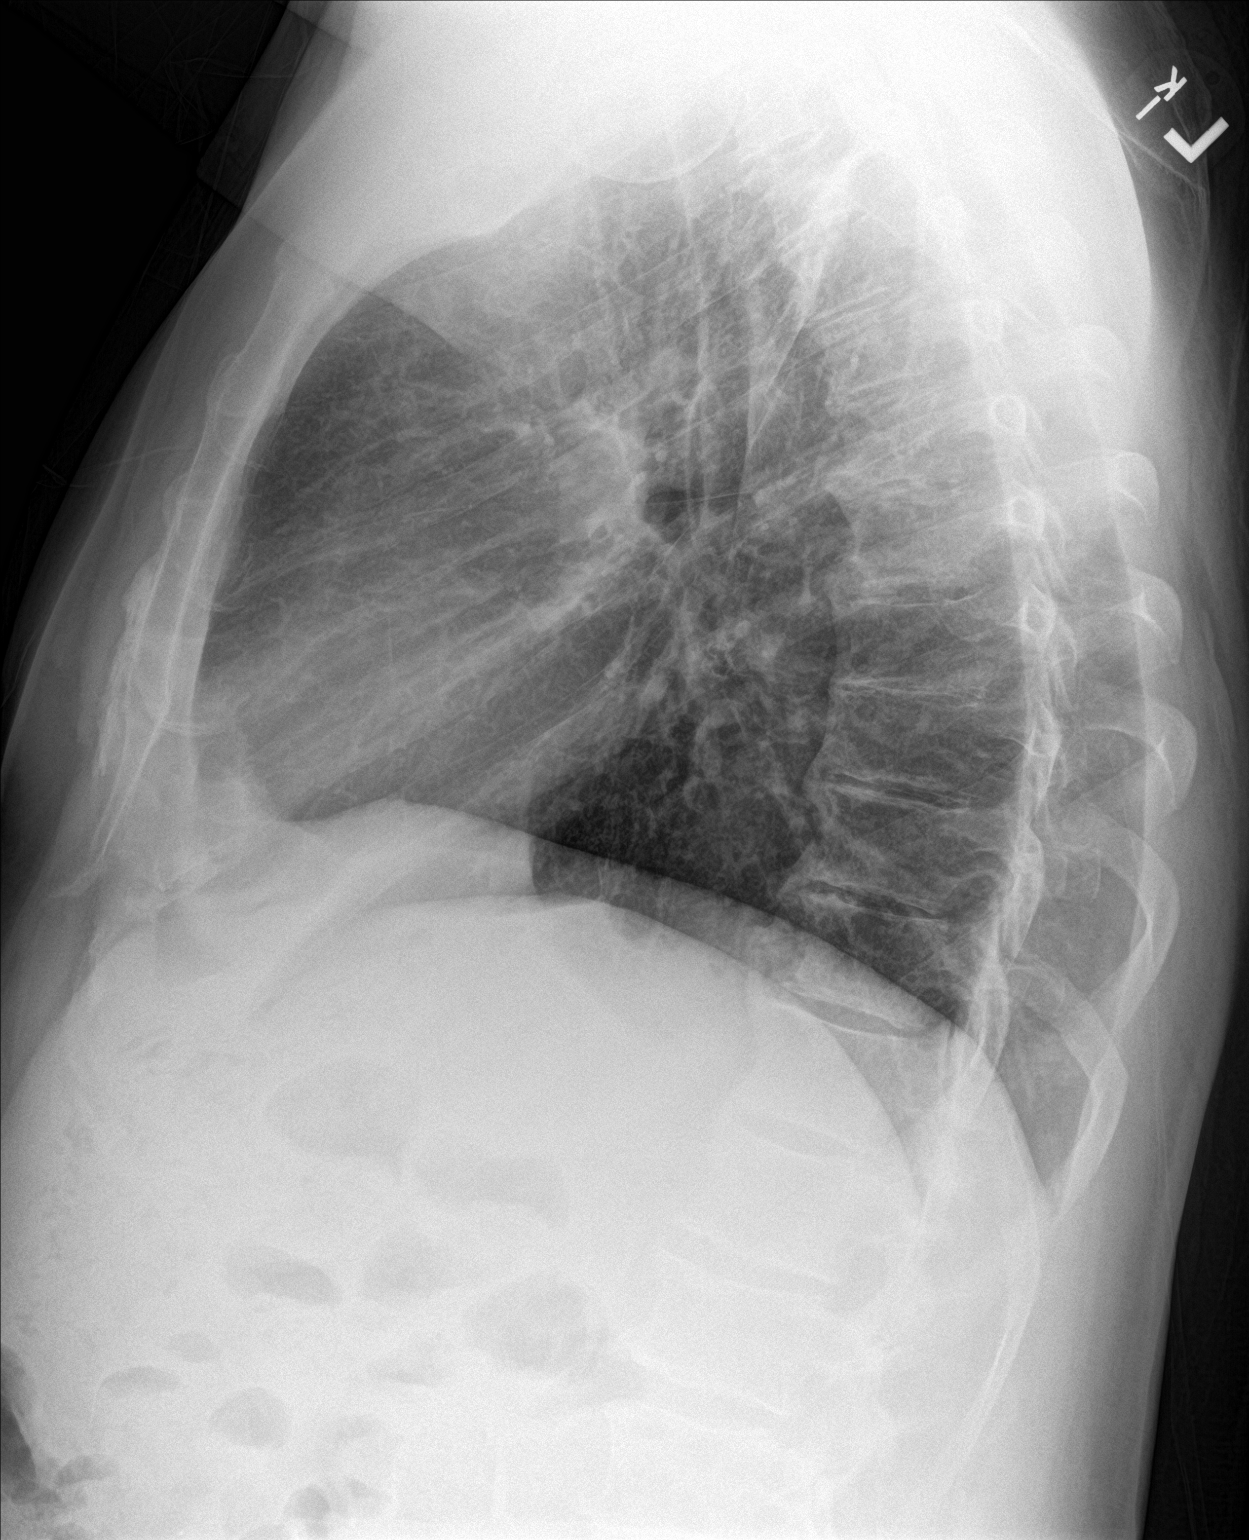

[2 of 2 positions shown; findings below may reference images not displayed]

FINDINGS: Left upper lobe opacity may represent scarring from prior infection
however, underlying mass cannot be excluded and chest CT is
recommended for further delineation.

No congestive heart.  Pneumothorax.

Heart size within normal limits.

Calcified aortic knob.
IMPRESSION: Left upper lobe opacity may represent scarring from prior infection
however, underlying mass cannot be excluded and chest CT is
recommended for further delineation.

These results will be called to the ordering clinician or
representative by the Radiologist Assistant, and communication
documented in the PACS or zVision Dashboard.

## 2016-10-24 ENCOUNTER — Ambulatory Visit (INDEPENDENT_AMBULATORY_CARE_PROVIDER_SITE_OTHER): Payer: BLUE CROSS/BLUE SHIELD | Admitting: Cardiovascular Disease

## 2016-10-24 ENCOUNTER — Encounter: Payer: Self-pay | Admitting: Cardiovascular Disease

## 2016-10-24 VITALS — BP 130/80 | HR 82 | Ht 73.0 in | Wt 225.8 lb

## 2016-10-24 DIAGNOSIS — Z7901 Long term (current) use of anticoagulants: Secondary | ICD-10-CM

## 2016-10-24 DIAGNOSIS — I255 Ischemic cardiomyopathy: Secondary | ICD-10-CM | POA: Diagnosis not present

## 2016-10-24 DIAGNOSIS — Z86718 Personal history of other venous thrombosis and embolism: Secondary | ICD-10-CM

## 2016-10-24 DIAGNOSIS — I5022 Chronic systolic (congestive) heart failure: Secondary | ICD-10-CM | POA: Diagnosis not present

## 2016-10-24 MED ORDER — CARVEDILOL 12.5 MG PO TABS: 12.5000 mg | ORAL_TABLET | Freq: Two times a day (BID) | ORAL | 8 refills | Status: DC

## 2016-10-24 MED ORDER — SILDENAFIL CITRATE 20 MG PO TABS: ORAL_TABLET | ORAL | 1 refills | Status: DC

## 2016-10-24 NOTE — Patient Instructions (Addendum)
Medication Instructions:  Your physician has recommended you make the following change in your medication:  1. INCREASE Carvedilol to 12.5mg  take one tablet by mouth twice a day 2. START Sildenafil 20mg  take 2-5 tablets by mouth daily as needed prior to sexual activity  Labwork: No new orders.    Testing/Procedures: No new orders.   Follow-Up: Your physician wants you to follow-up in: 6 MONTHS with Dr Excell Seltzerooper. You will receive a reminder letter in the mail two months in advance. If you don't receive a letter, please call our office to schedule the follow-up appointment.  You have been referred to Dr Cyndie ChimeGranfortuna for hematology evaluation   Any Other Special Instructions Will Be Listed Below (If Applicable).     If you need a refill on your cardiac medications before your next appointment, please call your pharmacy.

## 2016-10-24 NOTE — Progress Notes (Signed)
Cardiology Office Note Date:  10/26/2016   ID:  Robert Hampton, DOB 07-24-1955, MRN [id number]  PCP:  Leland Her, DO  Cardiologist:  Tonny Bollman, MD    Chief Complaint  Patient presents with  . Follow-up   History of Present Illness: Robert Hampton is a 61 y.o. male who presents for follow-up evaluation.   The patient is followed for coronary artery disease and congestive heart failure. He presented initially in July 2017 with acute heart failure and respiratory failure. His LV function was severely depressed with an ejection fraction of 35%. He was noted to have multivessel coronary artery disease at catheterization and he underwent surgical evaluation felt to be a poor candidate for multivessel CABG in the context of his acute medical illnesses. He ultimately underwent 3 vessel PCI with [employer] drug-eluting stents (no polymer) as part of the [employer] clinical trial. The patient was diagnosed with an acute DVT in the right leg and has been chronically anticoagulated initially with Xarelto and now with Eliquis ever since that time. A follow-up lower extremity venous Doppler study in November 2017 demonstrated resolution of the DVT.  The patient is here alone today. He's had no chest pain or shortness of breath. He has cut back on alcohol consumption. He still drinks regularly but limits alcohol to about 2 drinks daily. He complains of generalized fatigue. He has no pain in his right leg and chronic swelling of the leg is unchanged since a remote injury over 2 decades ago. He is compliant with his medications. He denies bleeding problems on a combination of clopidogrel and eliquis.   Past Medical History:  Diagnosis Date  . Abnormal CT scan of lung 08/2015  . Acute respiratory failure with hypoxia (HCC) 09/2015  . Acute systolic HF (heart failure) (HCC) 09/2015  . Coronary artery disease   . Diabetes mellitus without complication (HCC)   . Diverticulosis    shown on CT    . DVT (deep venous thrombosis) (HCC) 10/06/2015   RT LEG    Past Surgical History:  Procedure Laterality Date  . CARDIAC CATHETERIZATION N/A 10/04/2015   Procedure: Right/Left Heart Cath and Coronary Angiography;  Surgeon: Tonny Bollman, MD;  Location: [facility] INVASIVE CV LAB;  Service: Cardiovascular;  Laterality: N/A;  . CARDIAC CATHETERIZATION N/A 10/06/2015   Procedure: Coronary Stent Intervention;  Surgeon: Tonny Bollman, MD;  Location: [facility] INVASIVE CV LAB;  Service: Cardiovascular;  Laterality: N/A;  . CORONARY STENT PLACEMENT  10/06/2015   Successful 3 vessel PCI using drug-eluting stents in the mid-RCA, mid-circumflex, and mid-LAD  . KNEE SURGERY      Current Outpatient Prescriptions  Medication Sig Dispense Refill  . apixaban (ELIQUIS) 5 MG TABS tablet Take 1 tablet (5 mg total) by mouth 2 (two) times daily. 60 tablet 3  . atorvastatin (LIPITOR) 80 MG tablet Take 1 tablet (80 mg total) by mouth daily. 90 tablet 1  . carvedilol (COREG) 12.5 MG tablet Take 1 tablet (12.5 mg total) by mouth 2 (two) times daily with a meal. 60 tablet 8  . clopidogrel (PLAVIX) 75 MG tablet TAKE ONE TABLET BY MOUTH EVERY MORNING WITH BREAKFAST 90 tablet 1  . furosemide (LASIX) 40 MG tablet TAKE 1 TABLET BY MOUTH EVERY DAY 30 tablet 2  . glipiZIDE (GLUCOTROL) 5 MG tablet TAKE 1 TABLET(5 MG) BY MOUTH TWICE DAILY BEFORE A MEAL 60 tablet 2  . lisinopril (PRINIVIL,ZESTRIL) 5 MG tablet TAKE 1 TABLET BY MOUTH DAILY 30 tablet 2  . metFORMIN (  GLUCOPHAGE) 500 MG tablet TAKE 2 TABLETS BY MOUTH TWICE DAILY WITH FOOD 120 tablet 3  . Multiple Vitamin (MULTIVITAMIN WITH MINERALS) TABS tablet Take 1 tablet by mouth daily. 30 tablet 1  . spironolactone (ALDACTONE) 25 MG tablet TAKE 1/2 TABLET BY MOUTH EVERY DAY 45 tablet 1  . sildenafil (REVATIO) 20 MG tablet Take 2-5 tablets by mouth daily as needed prior to sexual activity 50 tablet 1   No current facility-administered medications for this visit.     Allergies:   Bee  venom   Social History:  The patient  reports that he quit smoking about 13 months ago. His smoking use included Cigarettes. He quit after 40.00 years of use. He has never used smokeless tobacco. He reports that he drinks alcohol. He reports that he does not use drugs.   Family History:  The patient's family history includes Heart attack in his mother; Hypertension in his mother.    ROS:  Please see the history of present illness.  Otherwise, review of systems is positive for leg swelling.  All other systems are reviewed and negative.    PHYSICAL EXAM: VS:  BP 130/80   Pulse 82   Ht 6\' 1"  (1.854 m)   Wt 225 lb 12.8 oz (102.4 kg)   BMI 29.79 kg/m  , BMI Body mass index is 29.79 kg/m. GEN: Well nourished, well developed, in no acute distress  HEENT: normal  Neck: no JVD, no masses. No carotid bruits Cardiac: RRR without murmur or gallop                Respiratory:  clear to auscultation bilaterally, normal work of breathing GI: soft, nontender, nondistended, + BS MS: no deformity or atrophy  Ext:1+ right leg edema, varicosities in the right ankle noted, pedal pulses 2+= bilaterally Skin: warm and dry, no rash Neuro:  Strength and sensation are intact Psych: euthymic mood, full affect  EKG:  EKG is not ordered today.  Recent Labs: 11/27/2015: BUN 20; Creatinine, Ser 0.74; Hemoglobin 16.1; Platelets 289.0; Potassium 4.6; Pro B Natriuretic peptide (BNP) 128.0; Sodium 134; TSH 1.35   Lipid Panel     Component Value Date/Time   CHOL 231 (H) 09/16/2016 1000   TRIG 246 (H) 09/16/2016 1000   HDL 38 (L) 09/16/2016 1000   CHOLHDL 6.1 (H) 09/16/2016 1000   LDLCALC 144 (H) 09/16/2016 1000     Wt Readings from Last 3 Encounters:  10/24/16 225 lb 12.8 oz (102.4 kg)  09/30/16 222 lb (100.7 kg)  06/24/16 221 lb (100.2 kg)    Cardiac Studies Reviewed: 2D Echo 01-22-2016: Study Conclusions  - Left ventricle: The cavity size was normal. There was mild   concentric hypertrophy.  Systolic function was mildly reduced. The   estimated ejection fraction was in the range of 45% to 50%. There   is akinesis of the basal-midinferior myocardium. Doppler   parameters are consistent with abnormal left ventricular   relaxation (grade 1 diastolic dysfunction). - Aortic valve: Mildly calcified annulus. Trileaflet; mildly   thickened, mildly calcified leaflets. Transvalvular velocity was   minimally increased. There was no stenosis. - Mitral valve: There was mild regurgitation. - Left atrium: The atrium was mildly dilated.  Impressions:  - EF is improved when compared to prior (35%)  ASSESSMENT AND PLAN: 1.  DVT - remains on anticoagulation and seems to be tolerating well. Question is whether he needs long-term oral anticoagulation. With his hx of right leg trauma/injury and chronic swelling, I'm concerned  that his risk of recurrence is increased. He did have resolution of venous thrombus on FU doppler study 6 months after initial diagnosis. I'm going to refer him to Dr Cyndie ChimeGranfortuna for an opinion regarding duration of anticoagulation.   2. CAD, native vessel: No angina. He is maintained on single antiplatelet therapy with clopidogrel in the setting of oral anticoagulation. No changes recommended today.  3. Chronic systolic heart failure, New York Heart Association functional class I: The patient is doing well with improvement in LV function. I have recommended he increase carvedilol to 12.5 mg twice daily. He is tolerating an ACE inhibitor and spironolactone.  4. Erectile dysfunction: Sildenafil prescription written.  5. Type 2 diabetes: Treated with oral hypoglycemic agents. Followed by his primary physician.  Current medicines are reviewed with the patient today.  The patient does not have concerns regarding medicines.  Labs/ tests ordered today include:   Orders Placed This Encounter  Procedures  . Ambulatory referral to Hematology    Disposition:   FU 6  months  Signed, Tonny Bollmanooper, Rhanda Lemire, MD  10/26/2016 10:33 PM    [facility] [address], [place], [place]  [address] Phone: [phone]) [phone]; Fax: [phone]

## 2016-10-25 ENCOUNTER — Telehealth: Payer: Self-pay | Admitting: Cardiovascular Disease

## 2016-10-25 MED ORDER — SILDENAFIL CITRATE 20 MG PO TABS: ORAL_TABLET | ORAL | 1 refills | Status: DC

## 2016-10-25 NOTE — Telephone Encounter (Signed)
Pt's medication was resent to the correct pharmacy as requested. Confirmation received.  ?

## 2016-10-25 NOTE — Telephone Encounter (Signed)
New message    Pt is calling stating that his medication was too expensive at the pharmacy it was sent to. He is asking that it be sent else where.    *STAT* If patient is at the pharmacy, call can be transferred to refill team.   1. Which medications need to be refilled? (please list name of each medication and dose if known) Sildenafil 20 mg  2. Which pharmacy/location (including street and city if local pharmacy) is medication to be sent to? [facility] [address]   3. Do they need a 30 day or 90 day supply? 30 day

## 2016-10-26 ENCOUNTER — Other Ambulatory Visit: Payer: Self-pay | Admitting: Family Medicine

## 2016-10-26 DIAGNOSIS — E1110 Type 2 diabetes mellitus with ketoacidosis without coma: Secondary | ICD-10-CM

## 2016-11-18 LAB — HM DIABETES EYE EXAM

## 2016-11-23 ENCOUNTER — Other Ambulatory Visit: Payer: Self-pay | Admitting: Family Medicine

## 2016-11-23 DIAGNOSIS — E1110 Type 2 diabetes mellitus with ketoacidosis without coma: Secondary | ICD-10-CM

## 2016-12-09 ENCOUNTER — Other Ambulatory Visit: Payer: Self-pay | Admitting: Family Medicine

## 2016-12-09 DIAGNOSIS — I82401 Acute embolism and thrombosis of unspecified deep veins of right lower extremity: Secondary | ICD-10-CM

## 2016-12-18 ENCOUNTER — Ambulatory Visit (INDEPENDENT_AMBULATORY_CARE_PROVIDER_SITE_OTHER): Payer: BLUE CROSS/BLUE SHIELD | Admitting: Oncology

## 2016-12-18 ENCOUNTER — Encounter: Payer: Self-pay | Admitting: Oncology

## 2016-12-18 ENCOUNTER — Encounter: Payer: BLUE CROSS/BLUE SHIELD | Admitting: Oncology

## 2016-12-18 VITALS — BP 154/89 | HR 80 | Temp 97.6°F | Ht 73.0 in | Wt 224.3 lb

## 2016-12-18 DIAGNOSIS — I82501 Chronic embolism and thrombosis of unspecified deep veins of right lower extremity: Secondary | ICD-10-CM

## 2016-12-18 NOTE — Patient Instructions (Signed)
Decrease Eliquis to 2.5 mg twice daily Return visit 1 year

## 2016-12-18 NOTE — Progress Notes (Signed)
New Patient Hematology   Robert Hampton [id number] [birth year] 61 y.o. 12/18/2016  CC: Dr. Einar Pheasant; Dr. 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 He has had chronic pain since that time.  He has developed varicose veins on that leg.  On 09/27/2015, he was admitted to the hospital with a one-week history of cough and dyspnea treated as an outpatient pneumonia initially with Levaquin.  Chest x-ray done on June 20 did not show a pulmonary infiltrate.  Due to persistent symptoms he was admitted.  CBC with leukocytosis.  BNP elevated at [facility].  Elevated liver functions.  Elevated serum troponins.  Chest x-ray with left upper lobe pneumonia.  Crackles at the lung bases.  Lower extremity edema right greater than left.  Venous Doppler studies of the lower extremities showed acute, proximal, DVT involving right posterior tibial, popliteal, peroneal, and femoral veins. He was started on anticoagulation initially with Xarelto and recently changed to Eliquis due to the price of the Xarelto.  He continues on Plavix 75 mg daily to prevent stent thrombosis which was started at time of stent placement in July 2017. .A CT angiogram scan of the chest done at the time of the initial DVT interpreted as showing an area of ground glass on nodular airspace opacity left upper lobe "most compatible with pneumonia". There was an abnormality in the left pulmonary artery at the bifurcation to the upper and lower lobes which although was possibly  related to a pulmonary embolus was felt to more likely represent extrinsic compression of the vessel by hilar adenopathy. Ultimately by discharge on July 8, symptom complex was felt to be more consistent with acute congestive heart failure likely precipitated by left upper lobe pneumonia.  I personally reviewed the CT images and there is a very dense left upper lobe infiltrate.  This does not have the appearance of pulmonary infarction and is most compatible with infection. He had a follow-up Doppler study on 02/07/2016 which showed resolution of the previous clot. Other than chronic venous insufficiency with varicose veins and previous crush injury to the right leg he has no other significant risk factors for clotting  He has no signs or symptoms of collagen vascular disorder. Currently without any dyspnea, chest pain, palpitations. He continues to have intermittent pain in his right leg especially if he is on his feet for a long time. Pain sometimes gets up to a level 10. Usually 5 or 6. He tries not to take anything for it unless absolutely necessary.  He has a complicated family history with 8 siblings and it is not clear how many of these are full blood relations and how many are half brothers and sisters. His father died at age 66 of alcohol-related cirrhosis. Mother is still alive at 32. He has one daughter age 70 and is healthy. There is no family history of thrombosis.   PMH: Past Medical History:  Diagnosis Date  . Abnormal CT scan of lung 08/2015  . Acute respiratory failure with hypoxia (HCC) 09/2015  . Acute systolic HF (heart  failure) (HCC) 09/2015  . Coronary artery disease   . Diabetes mellitus without complication (HCC)   . Diverticulosis    shown on CT  . DVT (deep venous thrombosis) (HCC) 10/06/2015   RT LEG    Past Surgical History:  Procedure Laterality Date  . CARDIAC CATHETERIZATION N/A 10/04/2015   Procedure: Right/Left Heart Cath and Coronary Angiography;  Surgeon:  Tonny Bollman, MD;  Location: [facility] INVASIVE CV LAB;  Service: Cardiovascular;  Laterality: N/A;  . CARDIAC CATHETERIZATION N/A 10/06/2015   Procedure: Coronary Stent Intervention;  Surgeon: Tonny Bollman, MD;  Location: [facility] INVASIVE CV LAB;  Service: Cardiovascular;  Laterality: N/A;  . CORONARY STENT PLACEMENT  10/06/2015   Successful 3 vessel PCI using drug-eluting stents in the mid-RCA, mid-circumflex, and mid-LAD  . KNEE SURGERY      Allergies: Allergies  Allergen Reactions  . Bee Venom Anaphylaxis    Medications:  Current Outpatient Prescriptions:  .  atorvastatin (LIPITOR) 80 MG tablet, Take 1 tablet (80 mg total) by mouth daily., Disp: 90 tablet, Rfl: 1 .  carvedilol (COREG) 12.5 MG tablet, Take 1 tablet (12.5 mg total) by mouth 2 (two) times daily with a meal., Disp: 60 tablet, Rfl: 2 .  clopidogrel (PLAVIX) 75 MG tablet, TAKE ONE TABLET BY MOUTH EVERY MORNING WITH BREAKFAST, Disp: 90 tablet, Rfl: 1 .  ELIQUIS 5 MG TABS tablet, TAKE 1 TABLET BY MOUTH TWICE DAILY, Disp: 60 tablet, Rfl: 3 .  furosemide (LASIX) 40 MG tablet, TAKE 1 TABLET BY MOUTH EVERY DAY, Disp: 30 tablet, Rfl: 3 .  glipiZIDE (GLUCOTROL) 5 MG tablet, TAKE 1 TABLET(5 MG) BY MOUTH TWICE DAILY BEFORE A MEAL, Disp: 60 tablet, Rfl: 3 .  lisinopril (PRINIVIL,ZESTRIL) 5 MG tablet, TAKE 1 TABLET BY MOUTH DAILY, Disp: 30 tablet, Rfl: 3 .  metFORMIN (GLUCOPHAGE) 500 MG tablet, TAKE 2 TABLETS BY MOUTH TWICE DAILY WITH FOOD, Disp: 120 tablet, Rfl: 1 .  Multiple Vitamin (MULTIVITAMIN WITH MINERALS) TABS tablet, Take 1 tablet by mouth daily., Disp: 30 tablet, Rfl: 1 .  sildenafil (REVATIO) 20 MG tablet, Take 2-5 tablets by mouth daily as needed prior to sexual activity, Disp: 50 tablet, Rfl: 1 .  spironolactone (ALDACTONE) 25 MG tablet, TAKE 1/2 TABLET BY MOUTH EVERY DAY, Disp: 45 tablet, Rfl: 1  Social History: Married. 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 he quit smoking about  15 months ago. His smoking use included Cigarettes. He quit after 40.00 years of use. He has never used smokeless tobacco. He reports that he drinks about 0.6 - 1.2 oz of alcohol per week but likely drinks more . He does not use drugs.  Family History: Family History  Problem Relation Age of Onset  . Heart attack Mother   . Hypertension Mother     Review of Systems: See history of present illness Remaining ROS negative.  Physical Exam: Blood pressure (!) 154/89, pulse 80, temperature 97.6 F (36.4 C), height  (1.854 m), weight 224 lb 4.8 oz (101.7 kg), SpO2 100 %. Wt Readings from Last 3 Encounters:  12/18/16 224 lb 4.8 oz (101.7 kg)  10/24/16 225 lb 12.8 oz (102.4 kg)  09/30/16 222 lb (100.7 kg)     General appearance: Talkative Caucasian man in no distress HENNT: Pharynx no erythema, exudate, mass, or ulcer. No thyromegaly or thyroid nodules Lymph nodes: No cervical, supraclavicular, or axillary lymphadenopathy Breasts:  Lungs: Clear to auscultation, resonant to percussion throughout Heart:  Regular rhythm, no murmur, no gallop, no rub, no click, no edema Abdomen: Soft, moderately obese, nontender, normal bowel sounds, no mass, no organomegaly Extremities: No edema, no calf tenderness, see vascular exam below Musculoskeletal: no joint deformities GU:  Vascular: Carotid pulses 2+, no bruits, Large palpable varicose veins medial aspect right calf. Multiple small superficial veins covering the dorsum of the right foot and ankle. Scar medial right calf from previous surgery. Measurements: 41 cm on the right, 39.5 on the left Ankle measurements: 25 cm on the right, 24 on the left. Neurologic: Alert, oriented, PERRLA, optic discs sharp and vessels normal, no hemorrhage or exudate, cranial nerves grossly normal, motor strength 5 over 5, reflexes 1+ symmetric, upper body coordination normal, gait normal, Skin: No rash or ecchymosis    Lab Results: Lab Results  Component Value  Date   WBC 13.5 (H) 11/27/2015   HGB 16.1 11/27/2015   HCT 46.8 11/27/2015   MCV 88.6 11/27/2015   PLT 289.0 11/27/2015     Chemistry      Component Value Date/Time   NA 134 (L) 11/27/2015 1129   K 4.6 11/27/2015 1129   CL 99 11/27/2015 1129   CO2 30 11/27/2015 1129   BUN 20 11/27/2015 1129   CREATININE 0.74 11/27/2015 1129   CREATININE 0.83 09/19/2015 1544      Component Value Date/Time   CALCIUM 9.8 11/27/2015 1129   ALKPHOS 88 10/07/2015 0548   AST 22 10/07/2015 0548   ALT 44 10/07/2015 0548   BILITOT 1.2 10/07/2015 0548          Radiological Studies: See discussion above  Impression: 14 months status post acute right lower extremity DVT with risk factors of acute left upper lobe pneumonia at time of diagnosis and chronic venous insufficiency due to varicose veins and prior crush injury to the right lower extremity. Little hard evidence that he had a pulmonary embolus at the same time.  I would consider this clot to be in a gray area with respect to duration of anticoagulation.  At least partially provoked by an acute inflammatory process in the lung.  No thrombotic events in the 27 years since the initial crush injury to his right leg but clot did occur in that leg and there is evidence of abnormal venous return with the presence of varicose veins.    Recommendation: I discussed 2 possible options: He has now been on full dose anticoagulation for over a year.  It would be quite reasonable based on 2 large studies 1 with Eliquis and 1 with Xarelto in patients at intermediate risk for recurrent events, to decrease the anticoagulant dose by 50% which still affords 100% protection with the minimal chance of bleeding. Second option would be to discontinue anticoagulation completely and just go on an aspirin daily which would afford approximately 30% reduction in clot recurrence and also be effective in primary prevention in this man with known multivessel coronary disease.   As noted above, he is still on Plavix in addition to the Eliquis now out over one year from stent placement.  After discussion with the patient, he would prefer option 1 and I have advised him to decrease his Eliquis dose to 2.5 mg twice daily. I will see him again in 1 year to reevaluate the situation.     Cephas Darby, MD, FACP  Hematology-Oncology/Internal Medicine  12/18/2016, 1:33 PM

## 2016-12-21 ENCOUNTER — Other Ambulatory Visit: Payer: Self-pay | Admitting: Family Medicine

## 2016-12-21 DIAGNOSIS — E1110 Type 2 diabetes mellitus with ketoacidosis without coma: Secondary | ICD-10-CM

## 2017-02-14 ENCOUNTER — Other Ambulatory Visit: Payer: Self-pay | Admitting: Family Medicine

## 2017-03-20 ENCOUNTER — Other Ambulatory Visit: Payer: Self-pay | Admitting: Family Medicine

## 2017-03-20 DIAGNOSIS — E1110 Type 2 diabetes mellitus with ketoacidosis without coma: Secondary | ICD-10-CM

## 2017-03-22 ENCOUNTER — Other Ambulatory Visit: Payer: Self-pay | Admitting: Family Medicine

## 2017-03-22 ENCOUNTER — Other Ambulatory Visit: Payer: Self-pay | Admitting: Cardiovascular Disease

## 2017-04-04 ENCOUNTER — Other Ambulatory Visit: Payer: Self-pay | Admitting: Family Medicine

## 2017-04-14 ENCOUNTER — Other Ambulatory Visit: Payer: Self-pay | Admitting: Family Medicine

## 2017-04-14 DIAGNOSIS — E785 Hyperlipidemia, unspecified: Secondary | ICD-10-CM

## 2017-04-18 ENCOUNTER — Other Ambulatory Visit: Payer: Self-pay | Admitting: Family Medicine

## 2017-04-18 DIAGNOSIS — E1110 Type 2 diabetes mellitus with ketoacidosis without coma: Secondary | ICD-10-CM

## 2017-04-22 ENCOUNTER — Telehealth: Payer: Self-pay | Admitting: Family Medicine

## 2017-04-22 NOTE — Telephone Encounter (Signed)
Called patient to inquire after diabetic supplies as fax received from unknown company for testing supplies. Left voicemail and asked patient to call back. Also asked for patient to schedule appointment as he is overdue for diabetes follow up. If he calls back please help him schedule appointment.

## 2017-04-23 NOTE — Telephone Encounter (Signed)
Form completed and placed in fax pile. 

## 2017-04-23 NOTE — Telephone Encounter (Signed)
Patient returned call and did initiate a request for supplies with a mail order pharmacy. Please complete form. Appt scheduled for 05/06/17 at 1:30 for recheck.Ples SpecterAlisa Brake, RN [facility] RN)

## 2017-05-12 ENCOUNTER — Other Ambulatory Visit: Payer: Self-pay | Admitting: Family Medicine

## 2017-05-12 DIAGNOSIS — I82401 Acute embolism and thrombosis of unspecified deep veins of right lower extremity: Secondary | ICD-10-CM

## 2017-05-13 MED ORDER — APIXABAN 2.5 MG PO TABS: 2.5000 mg | ORAL_TABLET | Freq: Two times a day (BID) | ORAL | 6 refills | Status: DC

## 2017-05-13 NOTE — Telephone Encounter (Signed)
Per Dr. Cyndie ChimeGranfortuna note on 12/18/16, should be on eliquis 2.5 mg BID for total of 1 year before reassessment.

## 2017-05-26 ENCOUNTER — Ambulatory Visit (INDEPENDENT_AMBULATORY_CARE_PROVIDER_SITE_OTHER): Payer: BLUE CROSS/BLUE SHIELD | Admitting: Family Medicine

## 2017-05-26 ENCOUNTER — Other Ambulatory Visit: Payer: Self-pay

## 2017-05-26 ENCOUNTER — Encounter: Payer: Self-pay | Admitting: Family Medicine

## 2017-05-26 VITALS — BP 138/74 | HR 73 | Ht 73.0 in | Wt 223.0 lb

## 2017-05-26 DIAGNOSIS — E119 Type 2 diabetes mellitus without complications: Secondary | ICD-10-CM

## 2017-05-26 DIAGNOSIS — I5022 Chronic systolic (congestive) heart failure: Secondary | ICD-10-CM | POA: Diagnosis not present

## 2017-05-26 DIAGNOSIS — I2510 Atherosclerotic heart disease of native coronary artery without angina pectoris: Secondary | ICD-10-CM | POA: Diagnosis not present

## 2017-05-26 DIAGNOSIS — I10 Essential (primary) hypertension: Secondary | ICD-10-CM | POA: Diagnosis not present

## 2017-05-26 DIAGNOSIS — E785 Hyperlipidemia, unspecified: Secondary | ICD-10-CM | POA: Diagnosis not present

## 2017-05-26 DIAGNOSIS — I2583 Coronary atherosclerosis due to lipid rich plaque: Secondary | ICD-10-CM | POA: Diagnosis not present

## 2017-05-26 LAB — POCT GLYCOSYLATED HEMOGLOBIN (HGB A1C): HEMOGLOBIN A1C: 7

## 2017-05-26 MED ORDER — EMPAGLIFLOZIN 10 MG PO TABS: 10.0000 mg | ORAL_TABLET | Freq: Every day | ORAL | 2 refills | Status: DC

## 2017-05-26 NOTE — Assessment & Plan Note (Addendum)
Stable, chronic. BP under good control. Appears euvolemic and doing well. Followed by cardiology, last saw Dr Excell Seltzerooper July 2018 with plan for 6 month follow up.  - Continue coreg 12.5mg  BID, spironolactone 25mg  qd, lisinopril 5mg  qd.  - check electrolytes and CBC today - management per cards

## 2017-05-26 NOTE — Assessment & Plan Note (Addendum)
Doing well on high intensity statin. - continue atorvastatin 80mg  qd - check LDL direct and LFTs today

## 2017-05-26 NOTE — Progress Notes (Signed)
Subjective:  Robert Hampton is a 62 y.o. male who presents to the [facility] today for diabetes follow up  HPI:  HTN/CHF Medications: coreg 12.5mg  BID, spironolactone 25mg  qd, lisinopril 5mg  qd. Tolerating well. Compliance- good. Wife manages all of his medications and he takes them as directed.   Chest pain- no      Dyspnea- no    Lightheadedness- no   Edema- no      DIABETES  Disease Monitoring: checking morning fastings regularly. Range 120-330, avg 170. Most mornings are good readings but every 3rd Robert or so he will notice that morning fasting will be in the 200-300s, usually after a late night eating or snack Medications: metformin 1000mg  BID, glipizide 5mg  with 2 meals, tolerating well Compliance- good  Polyuria- no New Visual problems- no, [place] in the last year.  Hypoglycemic symptoms- no Thinks his diabetic diet is overall pretty good but wonders if he has any room for improvement.  24-hr recall  (Up at 5 AM) B (515 AM)- coffee, half and half, no sugar 6am plain bowl of cheerios with almond milk, coffee  L (1PM)- chicken salad, english muffin, green tea unsweetened Snk (3 PM)- nuts, green tea  D (6 PM)-chicken salad on lettuce, 1 big glass red wine Typical Robert? Yes.   Sugar this morning was 330  Recently joined a gym with his wife but hasn't been going like he should. Voices that good a1c control is important to him but maybe hasn't been quite as good as he would like.      HYPERLIPIDEMIA  Disease Monitoring  See symptoms for Hypertension  Medications: atorvastatin 80mg  qd. Compliance- good, tolerating well RUQ pain- no  Muscle aches- no     ROS: Per HPI  PMH: He reports that he quit smoking about 20 months ago. His smoking use included cigarettes. He quit after 40.00 years of use. he has never used smokeless tobacco. He reports that he drinks about 0.6 - 1.2 oz of alcohol per week. He reports that he does not use drugs.   Objective:  Physical Exam: BP  138/74   Pulse 73   Ht 6\' 1"  (1.854 m)   Wt 223 lb (101.2 kg)   SpO2 98%   BMI 29.42 kg/m   Gen: NAD, resting comfortably CV: RRR with no murmurs appreciated Pulm: NWOB, CTAB with no crackles, wheezes, or rhonchi GI: Normal bowel sounds present. Soft, Nontender, Nondistended. MSK: no edema, cyanosis, or clubbing noted Skin: warm, dry Neuro: grossly normal, moves all extremities Psych: Normal affect and thought content  Results for orders placed or performed in visit on 05/26/17 (from the past 72 hour(s))  HgB A1c     Status: None   Collection Time: 05/26/17  1:34 PM  Result Value Ref Range   Hemoglobin A1C 7.0      Assessment/Plan:  Diabetes mellitus without complication (HCC) A1c today at goal but trend over the last few visits has been increasing from 6.1 to 6.8 and now 7.0 today.  - Continue metformin 1000mg  BID and glizipide 5mg  BID with 2 meals.  - Joint decision making with patient to be more aggressive with blood sugar control and since he now has insurance and known CAD, that would add jardiance 10mg  qd.  - Recheck a1c in 3 months.  - Records release for [place] [facility] for DM eye exam obtained today  CHF (congestive heart failure) (HCC) Stable, chronic. BP under good control. Appears euvolemic and doing well. Followed  by cardiology, last saw Dr Excell Seltzerooper July 2018 with plan for 6 month follow up.  - Continue coreg 12.5mg  BID, spironolactone 25mg  qd, lisinopril 5mg  qd.  - check electrolytes and CBC today - management per cards  Coronary artery disease due to lipid rich plaque Doing well on high intensity statin. - continue atorvastatin 80mg  qd - check LDL direct and LFTs today   Leland HerElsia J Juvia Aerts, DO PGY-2, [facility] 05/26/2017 1:40 PM

## 2017-05-26 NOTE — Assessment & Plan Note (Signed)
A1c today at goal but trend over the last few visits has been increasing from 6.1 to 6.8 and now 7.0 today.  - Continue metformin 1000mg  BID and glizipide 5mg  BID with 2 meals.  - Joint decision making with patient to be more aggressive with blood sugar control and since he now has insurance and known CAD, that would add jardiance 10mg  qd.  - Recheck a1c in 3 months.  - Records release for [place] [facility] for DM eye exam obtained today

## 2017-05-26 NOTE — Patient Instructions (Signed)
It was good to see you today!  For your diabetes, - keep taking metformin, no change to your usual 1000mg  twice a day - start jardiance 10mg  daily - keep taking glipizide for now, if your a1c improves we may be able to stop the glipizide - let me know if you have symptoms of low blood sugars - keep up the good work with eating good and exercising - follow up in 3 months for a1c recheck  Blood pressure looks good today.  We are checking some labs today. If results require attention, either myself or my nurse will get in touch with you. If everything is normal, you will get a letter in the mail or a message in My Chart. Please give us a call if you do not hear from us after 2 weeks.    Please check-out at the front desk before leaving the clinic. Make an appointment in  3 months.  Feel free to call with any questions or concerns at any time, at [phone].   Take care,  Dr. Leland HerElsia J Tremond Shimabukuro, DO [facility]

## 2017-05-27 LAB — CBC
Hematocrit: 43.3 % (ref 37.5–51.0)
Hemoglobin: 14.8 g/dL (ref 13.0–17.7)
MCH: 32.5 pg (ref 26.6–33.0)
MCHC: 34.2 g/dL (ref 31.5–35.7)
MCV: 95 fL (ref 79–97)
PLATELETS: 310 10*3/uL (ref 150–379)
RBC: 4.55 x10E6/uL (ref 4.14–5.80)
RDW: 13.7 % (ref 12.3–15.4)
WBC: 13 10*3/uL — ABNORMAL HIGH (ref 3.4–10.8)

## 2017-05-27 LAB — CMP14+EGFR
A/G RATIO: 1.5 (ref 1.2–2.2)
ALT: 23 IU/L (ref 0–44)
AST: 19 IU/L (ref 0–40)
Albumin: 4.3 g/dL (ref 3.6–4.8)
Alkaline Phosphatase: 66 IU/L (ref 39–117)
BILIRUBIN TOTAL: 0.3 mg/dL (ref 0.0–1.2)
BUN/Creatinine Ratio: 22 (ref 10–24)
BUN: 17 mg/dL (ref 8–27)
CHLORIDE: 100 mmol/L (ref 96–106)
CO2: 24 mmol/L (ref 20–29)
Calcium: 9.9 mg/dL (ref 8.6–10.2)
Creatinine, Ser: 0.76 mg/dL (ref 0.76–1.27)
GFR calc non Af Amer: 99 mL/min/{1.73_m2} (ref >59)
GFR, EST AFRICAN AMERICAN: 115 mL/min/{1.73_m2} (ref >59)
Globulin, Total: 2.8 g/dL (ref 1.5–4.5)
Glucose: 141 mg/dL — ABNORMAL HIGH (ref 65–99)
POTASSIUM: 5.2 mmol/L (ref 3.5–5.2)
Sodium: 139 mmol/L (ref 134–144)
Total Protein: 7.1 g/dL (ref 6.0–8.5)

## 2017-05-27 LAB — LDL CHOLESTEROL, DIRECT: LDL Direct: 93 mg/dL (ref 0–99)

## 2017-05-28 ENCOUNTER — Telehealth: Payer: Self-pay | Admitting: Family Medicine

## 2017-05-28 DIAGNOSIS — D72829 Elevated white blood cell count, unspecified: Secondary | ICD-10-CM

## 2017-05-28 NOTE — Telephone Encounter (Signed)
Patient called and informed of persistently high WBC. No fever/chills, no recent illness. Agreeable to returning for lab visit for CBC with diff and peripheral smear. Also of note, patient planning on calling his pharmacy and may call back regarding cost of jardiance.

## 2017-05-28 NOTE — Telephone Encounter (Signed)
Pt called nurse line to inform us that [place] PepperJardiance will cost him $400.    Attempted to call back to set up appt, no answer.  LM for pt to call back and schedule and that I would let Dr. Artist PaisYoo know that price of the Jardiance. Fleeger, Maryjo RochesterJessica Dawn, CMA

## 2017-05-29 NOTE — Telephone Encounter (Signed)
No need for appointment. Spoke to pharmacy and copay with coupon was $15.

## 2017-06-06 ENCOUNTER — Encounter: Payer: Self-pay | Admitting: Family Medicine

## 2017-06-17 ENCOUNTER — Other Ambulatory Visit: Payer: BLUE CROSS/BLUE SHIELD

## 2017-06-17 DIAGNOSIS — D72829 Elevated white blood cell count, unspecified: Secondary | ICD-10-CM

## 2017-06-18 LAB — CBC WITH DIFFERENTIAL/PLATELET
BASOS ABS: 0.1 10*3/uL (ref 0.0–0.2)
Basos: 1 %
EOS (ABSOLUTE): 0.4 10*3/uL (ref 0.0–0.4)
EOS: 3 %
Hematocrit: 44.6 % (ref 37.5–51.0)
Hemoglobin: 15.2 g/dL (ref 13.0–17.7)
IMMATURE GRANULOCYTES: 1 %
Immature Grans (Abs): 0.1 10*3/uL (ref 0.0–0.1)
LYMPHS ABS: 2.8 10*3/uL (ref 0.7–3.1)
Lymphs: 22 %
MCH: 32.3 pg (ref 26.6–33.0)
MCHC: 34.1 g/dL (ref 31.5–35.7)
MCV: 95 fL (ref 79–97)
MONOS ABS: 1.3 10*3/uL — AB (ref 0.1–0.9)
Monocytes: 10 %
NEUTROS PCT: 63 %
Neutrophils Absolute: 8.4 10*3/uL — ABNORMAL HIGH (ref 1.4–7.0)
PLATELETS: 337 10*3/uL (ref 150–379)
RBC: 4.7 x10E6/uL (ref 4.14–5.80)
RDW: 13.7 % (ref 12.3–15.4)
WBC: 13 10*3/uL — AB (ref 3.4–10.8)

## 2017-06-22 LAB — PATHOLOGIST SMEAR REVIEW
BASOS ABS: 0.1 10*3/uL (ref 0.0–0.2)
Basos: 0 %
EOS (ABSOLUTE): 0.4 10*3/uL (ref 0.0–0.4)
EOS: 3 %
Hematocrit: 44.5 % (ref 37.5–51.0)
Hemoglobin: 15.3 g/dL (ref 13.0–17.7)
IMMATURE GRANULOCYTES: 0 %
Immature Grans (Abs): 0 10*3/uL (ref 0.0–0.1)
LYMPHS ABS: 3.3 10*3/uL — AB (ref 0.7–3.1)
Lymphs: 24 %
MCH: 31.9 pg (ref 26.6–33.0)
MCHC: 34.4 g/dL (ref 31.5–35.7)
MCV: 93 fL (ref 79–97)
MONOCYTES: 9 %
Monocytes Absolute: 1.2 10*3/uL — ABNORMAL HIGH (ref 0.1–0.9)
NEUTROS PCT: 64 %
Neutrophils Absolute: 8.9 10*3/uL — ABNORMAL HIGH (ref 1.4–7.0)
PATH REV PLTS: NORMAL
PLATELETS: 320 10*3/uL (ref 150–379)
Path Rev RBC: NORMAL
RBC: 4.79 x10E6/uL (ref 4.14–5.80)
RDW: 13.6 % (ref 12.3–15.4)
WBC: 13.9 10*3/uL — ABNORMAL HIGH (ref 3.4–10.8)

## 2017-06-24 NOTE — Progress Notes (Signed)
Referral to hematology placed today. Patient prefers M, T, W for appointments.

## 2017-07-11 ENCOUNTER — Other Ambulatory Visit: Payer: Self-pay | Admitting: Cardiovascular Disease

## 2017-07-24 ENCOUNTER — Other Ambulatory Visit: Payer: Self-pay | Admitting: Oncology

## 2017-07-24 DIAGNOSIS — D72829 Elevated white blood cell count, unspecified: Secondary | ICD-10-CM

## 2017-08-08 ENCOUNTER — Other Ambulatory Visit: Payer: Self-pay | Admitting: Oncology

## 2017-08-08 DIAGNOSIS — D72829 Elevated white blood cell count, unspecified: Secondary | ICD-10-CM

## 2017-08-11 ENCOUNTER — Other Ambulatory Visit (INDEPENDENT_AMBULATORY_CARE_PROVIDER_SITE_OTHER): Payer: BLUE CROSS/BLUE SHIELD

## 2017-08-11 DIAGNOSIS — D72829 Elevated white blood cell count, unspecified: Secondary | ICD-10-CM

## 2017-08-11 LAB — CBC WITH DIFFERENTIAL/PLATELET
BASOS ABS: 0.1 10*3/uL (ref 0.0–0.1)
BASOS PCT: 1 %
Eosinophils Absolute: 0.5 10*3/uL (ref 0.0–0.7)
Eosinophils Relative: 3 %
HEMATOCRIT: 50.2 % (ref 39.0–52.0)
Hemoglobin: 17.1 g/dL — ABNORMAL HIGH (ref 13.0–17.0)
LYMPHS PCT: 24 %
Lymphs Abs: 3.5 10*3/uL (ref 0.7–4.0)
MCH: 33.5 pg (ref 26.0–34.0)
MCHC: 34.1 g/dL (ref 30.0–36.0)
MCV: 98.2 fL (ref 78.0–100.0)
Monocytes Absolute: 1.3 10*3/uL — ABNORMAL HIGH (ref 0.1–1.0)
Monocytes Relative: 9 %
NEUTROS ABS: 9.1 10*3/uL — AB (ref 1.7–7.7)
NEUTROS PCT: 63 %
Platelets: 308 10*3/uL (ref 150–400)
RBC: 5.11 MIL/uL (ref 4.22–5.81)
RDW: 14 % (ref 11.5–15.5)
WBC: 14.4 10*3/uL — AB (ref 4.0–10.5)

## 2017-08-11 LAB — SAVE SMEAR

## 2017-08-11 LAB — SEDIMENTATION RATE: Sed Rate: 3 mm/hr (ref 0–16)

## 2017-08-14 LAB — JAK2 GENOTYPR

## 2017-08-17 ENCOUNTER — Other Ambulatory Visit: Payer: Self-pay | Admitting: Family Medicine

## 2017-08-17 DIAGNOSIS — E119 Type 2 diabetes mellitus without complications: Secondary | ICD-10-CM

## 2017-08-17 DIAGNOSIS — E1110 Type 2 diabetes mellitus with ketoacidosis without coma: Secondary | ICD-10-CM

## 2017-08-18 ENCOUNTER — Ambulatory Visit (INDEPENDENT_AMBULATORY_CARE_PROVIDER_SITE_OTHER): Payer: BLUE CROSS/BLUE SHIELD | Admitting: Oncology

## 2017-08-18 ENCOUNTER — Other Ambulatory Visit: Payer: Self-pay

## 2017-08-18 ENCOUNTER — Encounter: Payer: Self-pay | Admitting: Oncology

## 2017-08-18 VITALS — BP 131/67 | HR 78 | Temp 98.0°F | Ht 73.0 in | Wt 215.6 lb

## 2017-08-18 DIAGNOSIS — Z955 Presence of coronary angioplasty implant and graft: Secondary | ICD-10-CM

## 2017-08-18 DIAGNOSIS — I82511 Chronic embolism and thrombosis of right femoral vein: Secondary | ICD-10-CM | POA: Diagnosis not present

## 2017-08-18 DIAGNOSIS — K029 Dental caries, unspecified: Secondary | ICD-10-CM | POA: Diagnosis not present

## 2017-08-18 DIAGNOSIS — L905 Scar conditions and fibrosis of skin: Secondary | ICD-10-CM | POA: Diagnosis not present

## 2017-08-18 DIAGNOSIS — I2510 Atherosclerotic heart disease of native coronary artery without angina pectoris: Secondary | ICD-10-CM

## 2017-08-18 DIAGNOSIS — I82541 Chronic embolism and thrombosis of right tibial vein: Secondary | ICD-10-CM | POA: Diagnosis not present

## 2017-08-18 DIAGNOSIS — Z7901 Long term (current) use of anticoagulants: Secondary | ICD-10-CM

## 2017-08-18 DIAGNOSIS — Z91030 Bee allergy status: Secondary | ICD-10-CM

## 2017-08-18 DIAGNOSIS — D72829 Elevated white blood cell count, unspecified: Secondary | ICD-10-CM

## 2017-08-18 DIAGNOSIS — Z8701 Personal history of pneumonia (recurrent): Secondary | ICD-10-CM

## 2017-08-18 DIAGNOSIS — E119 Type 2 diabetes mellitus without complications: Secondary | ICD-10-CM | POA: Diagnosis not present

## 2017-08-18 DIAGNOSIS — I82501 Chronic embolism and thrombosis of unspecified deep veins of right lower extremity: Secondary | ICD-10-CM

## 2017-08-18 DIAGNOSIS — K069 Disorder of gingiva and edentulous alveolar ridge, unspecified: Secondary | ICD-10-CM | POA: Diagnosis not present

## 2017-08-18 DIAGNOSIS — Z87828 Personal history of other (healed) physical injury and trauma: Secondary | ICD-10-CM

## 2017-08-18 DIAGNOSIS — Z7984 Long term (current) use of oral hypoglycemic drugs: Secondary | ICD-10-CM

## 2017-08-18 DIAGNOSIS — I8311 Varicose veins of right lower extremity with inflammation: Secondary | ICD-10-CM

## 2017-08-18 DIAGNOSIS — K0889 Other specified disorders of teeth and supporting structures: Secondary | ICD-10-CM

## 2017-08-18 NOTE — Progress Notes (Signed)
Hematology and Oncology Follow Up Visit  Robert Hampton [id number] 05-Sep-1955 62 y.o. 08/18/2017 1:20 PM   Principle Diagnosis: Encounter Diagnoses  Name Primary?  . Chronic deep vein thrombosis (DVT) of right lower extremity, unspecified vein (HCC) Yes  . Chronic anticoagulation   Clinical summary: Please see office consultation dated December 18, 2016 for complete details of his medical history. 62 year old man with coronary artery disease status post angioplasty and stent placement, diabetes on oral agent, hospitalized in June 2017 for an acute respiratory illness, initially treated as an outpatient pneumonia with Levaquin but then developed progressive symptoms.  Chest x-ray with a left upper lobe infiltrate.  CT angiogram of the chest low probability for pulmonary embolus.  However, venous Doppler studies showed acute proximal DVT in the right calf from posterior tibial through femoral veins.  He was on Plavix at the time.  Of note he has chronic venous insufficiency of the veins of the right lower extremity with varicose veins and history of a previous crush injury to the right leg.  He had no other personal or family risk factors for thrombosis.  At the time of his initial visit with me he was 14 months post the acute DVT.  I felt he was at intermediate risk for a new event given chronic venous changes in his right leg.  We discussed options to include reduced dose anticoagulant versus antiplatelet agents.  Given the fixed venous changes in the right lower extremity, and the occurrence of this event while on an antiplatelet agent, he opted for low-dose anticoagulation.  Xarelto decreased to 2.5 mg twice daily at that time and he continues this at present.  Interim History:   Overall doing well.  Still having intermittent pain in his right leg.  Not using an elastic garment on a regular basis.  He denies any chest pain, dyspnea, or palpitations.  No bleeding issues on reduced dose Eliquis  and specifically denies epistaxis, hematuria, hematochezia or melena. I noted on recent labs done on May 13 a rising hemoglobin from baseline 15 up to 17 with hematocrit 50%.  He has chronic mild leukocytosis and borderline elevation of his platelet count.  I added a JAK-2 gene mutation analysis to rule out polycythemia vera.  This has returned negative.  Medications: reviewed  Allergies:  Allergies  Allergen Reactions  . Bee Venom Anaphylaxis    Review of Systems: See interim history Remaining ROS negative:   Physical Exam: Blood pressure 131/67, pulse 78, temperature 98 F (36.7 C), height  (1.854 m), weight 215 lb 9.6 oz (97.8 kg), SpO2 98 %. Wt Readings from Last 3 Encounters:  08/18/17 215 lb 9.6 oz (97.8 kg)  05/26/17 223 lb (101.2 kg)  12/18/16 224 lb 4.8 oz (101.7 kg)     General appearance: Well-nourished Caucasian man HENNT: Poor dentition with multiple dental caries.  Chronic gum disease.  Pharynx no erythema, exudate, mass, or ulcer. No thyromegaly or thyroid nodules Lymph nodes: No cervical, supraclavicular, or axillary lymphadenopathy Breasts: No abnormal skin changes, no dominant mass in either breast Lungs: Clear to auscultation, resonant to percussion throughout Heart: Regular rhythm, no murmur, no gallop, no rub, no click, no edema Abdomen: Soft, nontender, normal bowel sounds, no mass, no organomegaly Extremities: No edema, no calf tenderness.  Chronic varicose veins right lower extremity.  Scar from prior surgery following a crush injury mid tibial region. Musculoskeletal: no joint deformities GU:  Vascular: Carotid pulses 2+, no bruits,  Neurologic: Alert, oriented, PERRLA, optic disc  sharp and vessels normal on the left not well visualized on the right, no hemorrhage or exudate, cranial nerves grossly normal, motor strength 5 over 5, reflexes 1+ symmetric, upper body coordination normal, gait normal, Skin: No rash or ecchymosis  Lab Results: CBC  W/Diff    Component Value Date/Time   WBC 14.4 (H) 08/11/2017 1502   RBC 5.11 08/11/2017 1502   HGB 17.1 (H) 08/11/2017 1502   HGB 15.2 06/17/2017 1057   HCT 50.2 08/11/2017 1502   HCT 44.6 06/17/2017 1057   PLT 308 08/11/2017 1502   PLT 337 06/17/2017 1057   MCV 98.2 08/11/2017 1502   MCV 95 06/17/2017 1057   MCH 33.5 08/11/2017 1502   MCHC 34.1 08/11/2017 1502   RDW 14.0 08/11/2017 1502   RDW 13.7 06/17/2017 1057   LYMPHSABS 3.5 08/11/2017 1502   LYMPHSABS 2.8 06/17/2017 1057   MONOABS 1.3 (H) 08/11/2017 1502   EOSABS 0.5 08/11/2017 1502   EOSABS 0.4 06/17/2017 1057   BASOSABS 0.1 08/11/2017 1502   BASOSABS 0.1 06/17/2017 1057     Chemistry      Component Value Date/Time   NA 139 05/26/2017 1407   K 5.2 05/26/2017 1407   CL 100 05/26/2017 1407   CO2 24 05/26/2017 1407   BUN 17 05/26/2017 1407   CREATININE 0.76 05/26/2017 1407   CREATININE 0.83 09/19/2015 1544      Component Value Date/Time   CALCIUM 9.9 05/26/2017 1407   ALKPHOS 66 05/26/2017 1407   AST 19 05/26/2017 1407   ALT 23 05/26/2017 1407   BILITOT 0.3 05/26/2017 1407       Radiological Studies: No results found.  Impression:  1.  Status post right lower extremity distal and proximal vein thrombosis, June, 2017, likely provoked by an acute pulmonary infection complicated by underlying venous pathology from previous crush injury to the right leg. We discussed options again today.  He is comfortable staying on the low-dose blood thinner.  He has had no subsequent events with respect to clotting and no bleeding events. I informed him that I would be retiring next spring.  I will provide hematology support until that time with subsequent referral to another hematologist as needed.  2.  Chronic leukocytosis Most likely secondary to chronic dental caries and gum disease and he plans to get attention for this. No evidence for polycythemia vera with a negative JAK-2 mutation analysis.  Normal white count  differential.  3.  Known coronary artery disease status post angioplasty and stent placement  4.  Type 2 diabetes on oral agent.  CC: Patient Care Team: Leland Her, DO as PCP - General Levert Feinstein, MD as Consulting Physician (Oncology) Tonny Bollman, MD as Consulting Physician (Cardiology)   Cephas Darby, MD, FACP  Hematology-Oncology/Internal Medicine     5/20/20191:20 PM

## 2017-08-18 NOTE — Patient Instructions (Signed)
Return visit as needed

## 2017-08-24 ENCOUNTER — Other Ambulatory Visit: Payer: Self-pay | Admitting: Family Medicine

## 2017-09-21 ENCOUNTER — Other Ambulatory Visit: Payer: Self-pay | Admitting: Family Medicine

## 2017-09-21 DIAGNOSIS — E1110 Type 2 diabetes mellitus with ketoacidosis without coma: Secondary | ICD-10-CM

## 2017-09-22 NOTE — Telephone Encounter (Signed)
Rx refilled. Due for DM follow soon, please inform

## 2017-10-08 ENCOUNTER — Telehealth: Payer: Self-pay | Admitting: *Deleted

## 2017-10-08 NOTE — Telephone Encounter (Addendum)
Left message for patient to call research office for LEADERS FREE II 2 year follow up call. Patient returned call and states no changes since last telephone follow up. He denies chest pain. Next research required visit will be in 1 year.

## 2017-10-18 ENCOUNTER — Other Ambulatory Visit: Payer: Self-pay | Admitting: Family Medicine

## 2017-10-18 DIAGNOSIS — I5022 Chronic systolic (congestive) heart failure: Secondary | ICD-10-CM

## 2017-10-18 DIAGNOSIS — E119 Type 2 diabetes mellitus without complications: Secondary | ICD-10-CM

## 2017-10-18 DIAGNOSIS — E785 Hyperlipidemia, unspecified: Secondary | ICD-10-CM

## 2017-10-18 DIAGNOSIS — E1110 Type 2 diabetes mellitus with ketoacidosis without coma: Secondary | ICD-10-CM

## 2017-10-18 DIAGNOSIS — I82401 Acute embolism and thrombosis of unspecified deep veins of right lower extremity: Secondary | ICD-10-CM

## 2017-10-18 DIAGNOSIS — I10 Essential (primary) hypertension: Secondary | ICD-10-CM

## 2017-10-20 MED ORDER — CARVEDILOL 12.5 MG PO TABS: 12.5000 mg | ORAL_TABLET | Freq: Two times a day (BID) | ORAL | 3 refills | Status: AC

## 2017-10-20 MED ORDER — EMPAGLIFLOZIN 10 MG PO TABS: 10.0000 mg | ORAL_TABLET | Freq: Every day | ORAL | 3 refills | Status: AC

## 2017-10-20 MED ORDER — CLOPIDOGREL BISULFATE 75 MG PO TABS: ORAL_TABLET | ORAL | 3 refills | Status: DC

## 2017-10-20 MED ORDER — APIXABAN 2.5 MG PO TABS: 2.5000 mg | ORAL_TABLET | Freq: Two times a day (BID) | ORAL | 3 refills | Status: AC

## 2017-10-20 MED ORDER — GLIPIZIDE 5 MG PO TABS: 5.0000 mg | ORAL_TABLET | Freq: Two times a day (BID) | ORAL | 3 refills | Status: DC

## 2017-10-20 MED ORDER — SPIRONOLACTONE 25 MG PO TABS
12.5000 mg | ORAL_TABLET | Freq: Every day | ORAL | 3 refills | Status: AC
Start: 2017-10-20 — End: ?

## 2017-10-20 MED ORDER — METFORMIN HCL 1000 MG PO TABS: 1000.0000 mg | ORAL_TABLET | Freq: Two times a day (BID) | ORAL | 3 refills | Status: AC

## 2017-10-20 MED ORDER — LISINOPRIL 5 MG PO TABS: 5.0000 mg | ORAL_TABLET | Freq: Every day | ORAL | 3 refills | Status: AC

## 2017-10-20 MED ORDER — ATORVASTATIN CALCIUM 80 MG PO TABS: 80.0000 mg | ORAL_TABLET | Freq: Every day | ORAL | 3 refills | Status: AC

## 2017-10-20 MED ORDER — FUROSEMIDE 40 MG PO TABS: 40.0000 mg | ORAL_TABLET | Freq: Every day | ORAL | 3 refills | Status: DC

## 2017-10-20 NOTE — Addendum Note (Signed)
Addended by: Leland HerYOO, Jovoni Borkenhagen J on: 10/20/2017 11:44 AM   Modules accepted: Orders

## 2017-10-20 NOTE — Telephone Encounter (Signed)
Patient scheduled an appointment for 10-28-17 and just wants to know if he can get a 90 day supply on this medication.  It would be cheaper for him and also cut down on so many trips to the pharmacy.  He would like to apply to all of his medications.  Jazmin Hartsell,CMA

## 2017-10-20 NOTE — Telephone Encounter (Signed)
Due for diabetes follow up. Can patient please be informed.

## 2017-10-28 ENCOUNTER — Encounter: Payer: Self-pay | Admitting: Family Medicine

## 2017-10-28 ENCOUNTER — Other Ambulatory Visit: Payer: Self-pay

## 2017-10-28 ENCOUNTER — Ambulatory Visit (INDEPENDENT_AMBULATORY_CARE_PROVIDER_SITE_OTHER): Payer: BLUE CROSS/BLUE SHIELD | Admitting: Family Medicine

## 2017-10-28 VITALS — BP 124/68 | HR 81 | Temp 97.6°F | Ht 73.0 in | Wt 215.0 lb

## 2017-10-28 DIAGNOSIS — E119 Type 2 diabetes mellitus without complications: Secondary | ICD-10-CM

## 2017-10-28 LAB — POCT GLYCOSYLATED HEMOGLOBIN (HGB A1C): HbA1c, POC (controlled diabetic range): 6.6 % (ref 0.0–7.0)

## 2017-10-28 NOTE — Patient Instructions (Addendum)
It was good to see you today!  No changes to your diabetes or blood pressure medications, everything looks good.  Call your cardiologist for a follow up appointment.    Please check-out at the front desk before leaving the clinic. Make an appointment in 6 months.  Feel free to call with any questions or concerns at any time, at [phone].   Take care,  Dr. Leland HerElsia J Cale Decarolis, DO [facility]

## 2017-10-28 NOTE — Progress Notes (Signed)
    Subjective:  Robert Hampton is a 62 y.o. male who [facility] for diabetes follow up  HPI:  SUBJECTIVE: 62 y.o. male for follow up of diabetes.  Has been doing well on metformin and glipizide, started jardiance and is tolerating well Diabetic Review of Systems - medication compliance: compliant all of the time, diabetic diet compliance: compliant all of the time with a very healthy diet, further diabetic ROS: no polyuria or polydipsia, no chest pain, dyspnea or TIA's, no numbness, tingling or pain in extremities, no unusual visual symptoms, no hypoglycemia, no medication side effects noted.  Other symptoms and concerns: none.  ROS: Per HPI  Objective:  Physical Exam: BP 124/68   Pulse 81   Temp 97.6 F (36.4 C) (Oral)   Ht 6\' 1"  (1.854 m)   Wt 215 lb (97.5 kg)   SpO2 96%   BMI 28.37 kg/m   Gen: NAD, resting comfortably CV: RRR with no murmurs appreciated Pulm: NWOB, CTAB with no crackles, wheezes, or rhonchi GI: Normal bowel sounds present. Soft, Nontender, Nondistended. MSK: no edema, cyanosis, or clubbing noted.  Diabetic Foot Exam - Simple   Simple Foot Form Diabetic Foot exam was performed with the following findings:  Yes 10/28/2017  2:27 PM  Visual Inspection No deformities, no ulcerations, no other skin breakdown bilaterally:  Yes Sensation Testing Intact to touch and monofilament testing bilaterally:  Yes Pulse Check Posterior Tibialis and Dorsalis pulse intact bilaterally:  Yes Comments    Skin: warm, dry Neuro: grossly normal, moves all extremities Psych: Normal affect and thought content  Results for orders placed or performed in visit on 10/28/17 (from the past 72 hour(s))  HgB A1c     Status: None   Collection Time: 10/28/17  2:08 PM  Result Value Ref Range   Hemoglobin A1C  4.0 - 5.6 %   HbA1c POC (<> result, manual entry)  4.0 - 5.6 %   HbA1c, POC (prediabetic range)  5.7 - 6.4 %   HbA1c, POC (controlled diabetic range) 6.6 0.0 -  7.0 %     Assessment/Plan:  Diabetes mellitus without complication (HCC) Stable and at goal. Improved from a1c 7.0 in 05/26/17 to 6.6 today. Patient is doing well with his diabetic diet. Continue current management with metformin 1000mg  BID, glipizide 5mg  BID, jardiance 10mg  daily. Follow up in 6 months.   Robert HerElsia J Majestic Brister, DO PGY-3, [facility] 10/28/2017 2:14 PM

## 2017-10-28 NOTE — Assessment & Plan Note (Signed)
Stable and at goal. Improved from a1c 7.0 in 05/26/17 to 6.6 today. Patient is doing well with his diabetic diet. Continue current management with metformin 1000mg  BID, glipizide 5mg  BID, jardiance 10mg  daily. Follow up in 6 months.

## 2018-01-01 ENCOUNTER — Other Ambulatory Visit: Payer: Self-pay | Admitting: Family Medicine

## 2018-01-01 DIAGNOSIS — E785 Hyperlipidemia, unspecified: Secondary | ICD-10-CM

## 2018-03-31 ENCOUNTER — Other Ambulatory Visit: Payer: Self-pay | Admitting: Oncology

## 2018-03-31 DIAGNOSIS — Z7901 Long term (current) use of anticoagulants: Secondary | ICD-10-CM

## 2018-03-31 DIAGNOSIS — I825Y1 Chronic embolism and thrombosis of unspecified deep veins of right proximal lower extremity: Secondary | ICD-10-CM

## 2018-04-02 ENCOUNTER — Other Ambulatory Visit: Payer: Self-pay | Admitting: Oncology

## 2018-04-13 ENCOUNTER — Other Ambulatory Visit: Payer: Self-pay | Admitting: Oncology

## 2018-04-13 DIAGNOSIS — I82501 Chronic embolism and thrombosis of unspecified deep veins of right lower extremity: Principal | ICD-10-CM

## 2018-04-13 DIAGNOSIS — IMO0001 Reserved for inherently not codable concepts without codable children: Secondary | ICD-10-CM

## 2018-04-13 DIAGNOSIS — Z7901 Long term (current) use of anticoagulants: Secondary | ICD-10-CM

## 2018-06-22 ENCOUNTER — Other Ambulatory Visit: Payer: Self-pay | Admitting: Family Medicine

## 2018-06-22 DIAGNOSIS — E1110 Type 2 diabetes mellitus with ketoacidosis without coma: Secondary | ICD-10-CM

## 2018-06-22 DIAGNOSIS — I5022 Chronic systolic (congestive) heart failure: Secondary | ICD-10-CM

## 2018-07-01 ENCOUNTER — Telehealth: Payer: Self-pay | Admitting: Oncology

## 2018-07-01 ENCOUNTER — Encounter: Payer: Self-pay | Admitting: Oncology

## 2018-07-01 NOTE — Telephone Encounter (Signed)
Received a referral from Dr. Cyndie Chime for DVT. Pt has been scheduled to see Dr. Clelia Croft on 5/19 at 2pm. Letter mailed.

## 2018-08-12 ENCOUNTER — Encounter: Payer: Self-pay | Admitting: Family Medicine

## 2018-08-12 ENCOUNTER — Telehealth: Payer: Self-pay

## 2018-08-12 NOTE — Telephone Encounter (Signed)
Patient calls nurse line stating he has to have a tooth pulled, due to decay and severe pain. Pt stated he needs a letter from his PCP allowing this procedure since he is on blood thinners. Pt stated he needs specific instructions. Please advise.

## 2018-08-12 NOTE — Telephone Encounter (Signed)
Letter created. Can patient please be informed and letter sent to his dentist

## 2018-08-12 NOTE — Progress Notes (Signed)
Letter created. Can patient please be informed and letter please be sent to his dentist.

## 2018-08-12 NOTE — Telephone Encounter (Signed)
Spoke with patient and he will come by and pick it up.  Robert Hampton,CMA

## 2018-08-13 ENCOUNTER — Encounter: Payer: Self-pay | Admitting: *Deleted

## 2018-08-13 DIAGNOSIS — Z006 Encounter for examination for normal comparison and control in clinical research program: Secondary | ICD-10-CM

## 2018-08-13 NOTE — Research (Signed)
LEADERS FREE II Research study 36 month telephone follow up call made. Patient denies any adverse events or changes in his medication. I updated pt about the stent 2018 results, and informed him that when the final results are out the research office will notify him via phone or letter. I thanked him for his participation.

## 2018-08-18 ENCOUNTER — Inpatient Hospital Stay: Payer: BLUE CROSS/BLUE SHIELD | Attending: Oncology | Admitting: Oncology

## 2018-08-26 NOTE — Addendum Note (Signed)
Addended by: Dorie Rank E on: 08/26/2018 06:30 PM   Modules accepted: Orders

## 2018-08-26 NOTE — Addendum Note (Signed)
Addended by: Neomia Dear on: 08/26/2018 06:29 PM   Modules accepted: Orders

## 2018-09-11 ENCOUNTER — Telehealth: Payer: Self-pay

## 2018-09-11 NOTE — Telephone Encounter (Signed)
Patient calls nurse line stating he went to pick up his Eliquis prescription and it is >500 dollars, this is with the good rx coupon. Patient is requesting something else to be called in to pharmacy.

## 2018-09-11 NOTE — Telephone Encounter (Signed)
Call patient pharmacy appears to be a high deductible insurance issue.  Called patient to discuss.  He states that his insurance has not recently changed.  His wife found a coupon on the [employer] that she is planning on trying.  We discussed that due to his high deductible that lower cost option would probably be Coumadin and come with many office visits.  He would like to avoid this if at all possible but also voices good understanding that he may not have good options.  He will call back if the manufacturer coupon is not a reasonable option for him.

## 2018-09-15 ENCOUNTER — Other Ambulatory Visit: Payer: Self-pay | Admitting: Cardiovascular Disease

## 2018-09-15 NOTE — Telephone Encounter (Signed)
Patient has not been seen in almost 2 years.  Ok to fill?

## 2018-09-21 ENCOUNTER — Ambulatory Visit: Payer: BLUE CROSS/BLUE SHIELD | Admitting: Family Medicine

## 2019-05-26 ENCOUNTER — Ambulatory Visit: Payer: BLUE CROSS/BLUE SHIELD | Admitting: Cardiovascular Disease

## 2019-07-08 ENCOUNTER — Encounter: Payer: Self-pay | Admitting: Cardiovascular Disease

## 2019-09-06 ENCOUNTER — Other Ambulatory Visit: Payer: Self-pay

## 2019-09-06 DIAGNOSIS — I5022 Chronic systolic (congestive) heart failure: Secondary | ICD-10-CM

## 2019-09-06 MED ORDER — FUROSEMIDE 40 MG PO TABS: ORAL_TABLET | ORAL | 3 refills | Status: AC

## 2020-04-17 ENCOUNTER — Other Ambulatory Visit: Payer: BLUE CROSS/BLUE SHIELD

## 2021-02-01 ENCOUNTER — Other Ambulatory Visit (HOSPITAL_BASED_OUTPATIENT_CLINIC_OR_DEPARTMENT_OTHER): Payer: Self-pay

## 2021-02-01 ENCOUNTER — Ambulatory Visit: Payer: BLUE CROSS/BLUE SHIELD | Attending: Internal Medicine

## 2021-02-01 DIAGNOSIS — Z23 Encounter for immunization: Secondary | ICD-10-CM

## 2021-02-01 MED ORDER — PFIZER COVID-19 VAC BIVALENT 30 MCG/0.3ML IM SUSP
INTRAMUSCULAR | 0 refills | Status: AC
  Filled 2021-02-01: qty 0.3, 1d supply, fill #0

## 2021-02-01 NOTE — Progress Notes (Signed)
   Covid-19 Vaccination Clinic  Name:  Robert Hampton    MRN: [id number] DOB: 10/20/1955  02/01/2021  [name] was observed post Covid-19 immunization for 15 minutes without incident. He was provided with Vaccine Information Sheet and instruction to access the V-Safe system.   [name] was instructed to call 911 with any severe reactions post vaccine: Difficulty breathing  Swelling of face and throat  A fast heartbeat  A bad rash all over body  Dizziness and weakness   Immunizations Administered     Name Date Dose VIS Date Route   Pfizer Covid-19 Vaccine Bivalent Booster 02/01/2021  1:47 PM 0.3 mL 11/29/2020 Intramuscular   Manufacturer: [employer], [employer]   Lot: [id number]   NDC: [phone]

## 2022-01-30 ENCOUNTER — Other Ambulatory Visit (HOSPITAL_COMMUNITY): Payer: Self-pay

## 2023-03-18 ENCOUNTER — Emergency Department (HOSPITAL_COMMUNITY)
Admission: EM | Admit: 2023-03-18 | Discharge: 2023-03-18 | Disposition: A | Discharge status: Home / Self Care | Payer: No Typology Code available for payment source | Attending: Emergency Medicine | Admitting: Emergency Medicine

## 2023-03-18 ENCOUNTER — Encounter (HOSPITAL_COMMUNITY): Payer: Self-pay

## 2023-03-18 ENCOUNTER — Other Ambulatory Visit: Payer: Self-pay

## 2023-03-18 ENCOUNTER — Emergency Department (HOSPITAL_COMMUNITY): Payer: No Typology Code available for payment source

## 2023-03-18 DIAGNOSIS — I509 Heart failure, unspecified: Secondary | ICD-10-CM | POA: Diagnosis not present

## 2023-03-18 DIAGNOSIS — Z7901 Long term (current) use of anticoagulants: Secondary | ICD-10-CM | POA: Insufficient documentation

## 2023-03-18 DIAGNOSIS — I2510 Atherosclerotic heart disease of native coronary artery without angina pectoris: Secondary | ICD-10-CM | POA: Diagnosis not present

## 2023-03-18 DIAGNOSIS — M5441 Lumbago with sciatica, right side: Secondary | ICD-10-CM | POA: Insufficient documentation

## 2023-03-18 DIAGNOSIS — M545 Low back pain, unspecified: Secondary | ICD-10-CM | POA: Diagnosis present

## 2023-03-18 DIAGNOSIS — R519 Headache, unspecified: Secondary | ICD-10-CM | POA: Diagnosis not present

## 2023-03-18 DIAGNOSIS — Z7902 Long term (current) use of antithrombotics/antiplatelets: Secondary | ICD-10-CM | POA: Diagnosis not present

## 2023-03-18 DIAGNOSIS — Y92410 Unspecified street and highway as the place of occurrence of the external cause: Secondary | ICD-10-CM | POA: Insufficient documentation

## 2023-03-18 MED ORDER — METHOCARBAMOL 500 MG PO TABS
500.0000 mg | ORAL_TABLET | Freq: Once | ORAL | Status: AC
  Administered 2023-03-18: 500 mg via ORAL
  Filled 2023-03-18: qty 1

## 2023-03-18 MED ORDER — METHOCARBAMOL 500 MG PO TABS: 500.0000 mg | ORAL_TABLET | Freq: Two times a day (BID) | ORAL | 0 refills | Status: AC

## 2023-03-18 MED ORDER — HYDROCODONE-ACETAMINOPHEN 5-325 MG PO TABS: 1.0000 | ORAL_TABLET | Freq: Four times a day (QID) | ORAL | 0 refills | Status: AC | PRN

## 2023-03-18 MED ORDER — OXYCODONE-ACETAMINOPHEN 5-325 MG PO TABS
1.0000 | ORAL_TABLET | Freq: Once | ORAL | Status: AC
  Administered 2023-03-18: 1 via ORAL
  Filled 2023-03-18: qty 1

## 2023-03-18 MED ORDER — LIDOCAINE 5 % EX PTCH
1.0000 | MEDICATED_PATCH | CUTANEOUS | Status: DC
  Administered 2023-03-18: 1 via TRANSDERMAL
  Filled 2023-03-18: qty 1
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
## 2023-03-18 NOTE — Discharge Instructions (Signed)
It was a pleasure taking care of you today.  As discussed, your CT images did not show any acute abnormalities.  It did show a thyroid nodule.  Please follow-up with PCP for further recommendations.  I am sending you home with pain medication and a muscle relaxer.  Take as needed for pain.  Both pain medication and muscle relaxer can cause drowsiness so do not drive or operate machinery while on the medication.  You may also purchase over-the-counter Lidoderm patches and Voltaren gel for added pain relief.  Please follow-up with PCP for recheck in the next few days.  Return to the ER for any worsening symptoms.
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
## 2023-11-06 NOTE — Progress Notes (Signed)
 This encounter was created in error - please disregard.

## 2024-03-06 ENCOUNTER — Other Ambulatory Visit: Payer: Self-pay | Admitting: Medical Genetics

## 2024-05-07 ENCOUNTER — Encounter: Payer: Self-pay | Admitting: Internal Medicine
